# Patient Record
Sex: Female | Born: 1955 | Race: Black or African American | Hispanic: No | Smoking: Former smoker
Health system: Southern US, Community
[De-identification: ages and names within clinical notes are randomized; demographics above are authoritative.]

## PROBLEM LIST (undated history)

## (undated) DIAGNOSIS — I499 Cardiac arrhythmia, unspecified: Secondary | ICD-10-CM

## (undated) DIAGNOSIS — I10 Essential (primary) hypertension: Secondary | ICD-10-CM

## (undated) DIAGNOSIS — B019 Varicella without complication: Secondary | ICD-10-CM

## (undated) DIAGNOSIS — E785 Hyperlipidemia, unspecified: Secondary | ICD-10-CM

## (undated) HISTORY — DX: Hyperlipidemia, unspecified: E78.5

## (undated) HISTORY — DX: Essential (primary) hypertension: I10

## (undated) HISTORY — PX: ABDOMINAL HYSTERECTOMY: SHX81

## (undated) HISTORY — PX: REDUCTION MAMMAPLASTY: SUR839

## (undated) HISTORY — DX: Varicella without complication: B01.9

---

## 1998-08-04 HISTORY — PX: BREAST SURGERY: SHX581

## 1999-04-24 ENCOUNTER — Other Ambulatory Visit: Admission: RE | Admit: 1999-04-24 | Discharge: 1999-04-24 | Payer: Self-pay | Admitting: Obstetrics and Gynecology

## 2003-08-05 HISTORY — PX: TOTAL ABDOMINAL HYSTERECTOMY W/ BILATERAL SALPINGOOPHORECTOMY: SHX83

## 2004-06-18 ENCOUNTER — Other Ambulatory Visit: Payer: Self-pay

## 2004-06-20 ENCOUNTER — Inpatient Hospital Stay: Payer: Self-pay | Admitting: Unknown Physician Specialty

## 2005-11-11 ENCOUNTER — Ambulatory Visit: Payer: Self-pay | Admitting: Unknown Physician Specialty

## 2005-11-28 ENCOUNTER — Ambulatory Visit: Payer: Self-pay | Admitting: Unknown Physician Specialty

## 2007-01-20 ENCOUNTER — Ambulatory Visit: Payer: Self-pay | Admitting: Gastroenterology

## 2008-02-01 ENCOUNTER — Ambulatory Visit: Payer: Self-pay | Admitting: Unknown Physician Specialty

## 2009-01-16 ENCOUNTER — Ambulatory Visit: Payer: Self-pay | Admitting: Unknown Physician Specialty

## 2009-12-13 ENCOUNTER — Ambulatory Visit: Payer: Self-pay | Admitting: Unknown Physician Specialty

## 2011-02-24 ENCOUNTER — Ambulatory Visit: Payer: Self-pay | Admitting: Unknown Physician Specialty

## 2012-08-18 ENCOUNTER — Ambulatory Visit: Payer: Self-pay | Admitting: Physician Assistant

## 2012-09-15 ENCOUNTER — Ambulatory Visit (INDEPENDENT_AMBULATORY_CARE_PROVIDER_SITE_OTHER): Payer: BC Managed Care – PPO | Admitting: Internal Medicine

## 2012-09-15 ENCOUNTER — Encounter: Payer: Self-pay | Admitting: Internal Medicine

## 2012-09-15 VITALS — BP 116/80 | HR 67 | Temp 98.0°F | Resp 16 | Ht 67.0 in | Wt 181.0 lb

## 2012-09-15 DIAGNOSIS — E785 Hyperlipidemia, unspecified: Secondary | ICD-10-CM

## 2012-09-15 DIAGNOSIS — R609 Edema, unspecified: Secondary | ICD-10-CM

## 2012-09-15 DIAGNOSIS — F17200 Nicotine dependence, unspecified, uncomplicated: Secondary | ICD-10-CM

## 2012-09-15 DIAGNOSIS — Z720 Tobacco use: Secondary | ICD-10-CM

## 2012-09-15 DIAGNOSIS — Z7189 Other specified counseling: Secondary | ICD-10-CM

## 2012-09-15 DIAGNOSIS — R600 Localized edema: Secondary | ICD-10-CM

## 2012-09-15 DIAGNOSIS — Z6825 Body mass index (BMI) 25.0-25.9, adult: Secondary | ICD-10-CM

## 2012-09-15 DIAGNOSIS — E663 Overweight: Secondary | ICD-10-CM

## 2012-09-15 DIAGNOSIS — Z716 Tobacco abuse counseling: Secondary | ICD-10-CM

## 2012-09-15 DIAGNOSIS — I10 Essential (primary) hypertension: Secondary | ICD-10-CM

## 2012-09-15 NOTE — Progress Notes (Signed)
Patient ID: Sarah Delgado, female   DOB: [birth year], 57 y.o.   MRN: [id number]    Patient Active Problem List  Diagnosis  . Overweight (BMI 25.0-29.9)  . Pedal edema  . Tobacco abuse  . Tobacco abuse counseling  . Essential hypertension, benign  . Other and unspecified hyperlipidemia    Subjective:  CC:   Chief Complaint  Patient presents with  . Establish Care    HPI:   Sarah Delgado is a 57 y.o. female who presents as a new patient to establish primary care with the chief complaint of  hypertension, hyperlipidemia, overweight, tobacco abuse and right foot swelling. Marland Kitchen   1) Hypertension diagnosed 8 years ago , has been managed with maxzide only.    2) Hyperlipidemia did not respond to dietary management and she started statin therapy 3 years ago with lipitor every other day    Her untreated total was 265 , LDL was 215.Takes co Q 10 daily.  Stopped exercising for the winter, does it religiously in the summer when she is off work ( she is an Programmer, systems).  She has a family history of CAD in second degree relatives but no first degree     3) pedal edema  Right foot only ,  Minimal but chronic.  No known history of trauma or DVT.  Improves with leg elevation,  Not associated with pain or decreased ROM.    4) Tobacco abuse.,  Quit for 15 years during first pregnancy but resumed on weekends only when she has asocial drink.  She would like to quit   Father died from COPD.   waiting for braces to come off  bc chewing gum helped before.   5) Weight gain. Her ideal weight is 160  but she has fluctuated btwn 175 and 182 for the past several years.  She achieved a  30 lb wt loss with Nutrisystem but it was too $$$ to maintain or repeat.  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    Diet: breakfast is cereal and fruit.  Lunch is salad with croutons or shredded tortilla strips.  No protein added.   Dinner: sometimes meatless.  Her carb cravings are not bread but more  desserts and fruits.    . Health maintenance Had her pelvic in January 2013.  She is s/p  TAH/BSO 2005    Had mammo with ultrasound at [facility] Jan 2013.  Had DEXA  Fasting labs done at [facility] in December. She requested a chest x ray because of smoking history . All were normal    Past Medical History  Diagnosis Date  . Chicken pox   . Hypertension   . Hyperlipidemia     Past Surgical History  Procedure Laterality Date  . Breast surgery  2000    reduction  . Total abdominal hysterectomy w/ bilateral salpingoophorectomy  2005    Family History  Problem Relation Age of Onset  . Arthritis Mother   . Hypertension Mother   . Emphysema Father   . COPD Sister   . Diabetes Sister   . Kidney disease Sister   . Diabetes Brother   . Hypertension Brother   . Heart disease Maternal Grandfather   . Diabetes Brother   . Cancer Brother     prostate  . Hypertension Brother   . Hypertension Brother   . Stroke Brother   . Hypertension Brother     History   Social History  . Marital Status: Divorced  Spouse Name: N/A    Number of Children: N/A  . Years of Education: 16 plus   Occupational History  . educator    Social History Main Topics  . Smoking status: Current Some Day Smoker    Types: Cigarettes  . Smokeless tobacco: Not on file  . Alcohol Use: Yes  . Drug Use: No  . Sexually Active: Yes -- Female partner(s)   Other Topics Concern  . Not on file   Social History Narrative   Educator, works in [employer] school system   No Known Allergies   Review of Systems:   Patient denies headache, fevers, malaise, unintentional weight loss, skin rash, eye pain, sinus congestion and sinus pain, sore throat, dysphagia,  hemoptysis , cough, dyspnea, wheezing, chest pain, palpitations, orthopnea, edema, abdominal pain, nausea, melena, diarrhea, constipation, flank pain, dysuria, hematuria, urinary  Frequency, nocturia, numbness, tingling, seizures,  Focal weakness, Loss of  consciousness,  Tremor, insomnia, depression, anxiety, and suicidal ideation.    Objective:  BP 116/80  Pulse 67  Temp(Src) 98 F (36.7 C) (Oral)  Resp 16  Ht 5\' 7"  (1.702 m)  Wt 181 lb (82.101 kg)  BMI 28.34 kg/m2  SpO2 99%  General appearance: alert, cooperative and appears stated age Ears: normal TM's and external ear canals both ears Throat: lips, mucosa, and tongue normal; teeth and gums normal Neck: no adenopathy, no carotid bruit, supple, symmetrical, trachea midline and thyroid not enlarged, symmetric, no tenderness/mass/nodules Back: symmetric, no curvature. ROM normal. No CVA tenderness. Lungs: clear to auscultation bilaterally Heart: regular rate and rhythm, S1, S2 normal, no murmur, click, rub or gallop Abdomen: soft, non-tender; bowel sounds normal; no masses,  no organomegaly Pulses: 2+ and symmetric Skin: Skin color, texture, turgor normal. No rashes or lesions Lymph nodes: Cervical, supraclavicular, and axillary nodes normal.  Assessment and Plan:  Overweight (BMI 25.0-29.9) I have addressed  BMI at patient's request and recommended a low glycemic index diet utilizing smaller more frequent meals to increase metabolism.  I have also recommended that patient start exercising with a goal of 30 minutes of aerobic exercise a minimum of 5 days per week. Screening for lipid disorders, thyroid and diabetes has been done by prior PCP and records have been requested.   Pedal edema Chronic, with no history of DVT or trauma.  Exam notes trace edema.  Discussed the probability of mild venous insufficiency given her occupational activities and recommended use of low gauge compression stockings and regular exercise   Tobacco abuse counseling The patient was counseled on the dangers of tobacco use, and was advised to quit.  Reviewed strategies to maximize success, including removing cigarettes and smoking materials from environment, stress management and substitution of other forms  of reinforcement.  Essential hypertension, benign Well controlled on current regimen. Renal function assessed by former PCP and records requested, no changes today.  Other and unspecified hyperlipidemia Discussed rational for continued treatment in light of personal risk factors for CAD (HTN, menopause, tobacco abuse and FH)   Updated Medication List Outpatient Encounter Prescriptions as of 09/15/2012  Medication Sig Dispense Refill  . atorvastatin (LIPITOR) 10 MG tablet Take 1 tablet by mouth every other day.      . B Complex-C (SUPER B COMPLEX) TABS Take 1 tablet by mouth every other day.      . Biotin (PA BIOTIN) 1000 MCG tablet Take 1,000 mcg by mouth daily.      . Calcium Carbonate-Vit D-Min (CALCIUM 600+D PLUS MINERALS) 600-400 MG-UNIT  TABS Take 1 tablet by mouth daily.      . Coenzyme Q10 10 MG capsule Take 10 mg by mouth daily.      . fluocinonide ointment (LIDEX) 0.05 % Apply 1 application topically 2 (two) times a week.      . minoxidil (ROGAINE) 2 % external solution Apply topically 3 (three) times a week.      Marland Kitchen PREMARIN 0.3 MG tablet Take 1 tablet by mouth daily.      Marland Kitchen triamterene-hydrochlorothiazide (MAXZIDE-25) 37.5-25 MG per tablet Take 1 tablet by mouth daily.       No facility-administered encounter medications on file as of 09/15/2012.     No orders of the defined types were placed in this encounter.    No Follow-up on file.

## 2012-09-15 NOTE — Patient Instructions (Addendum)
You r sellign is due to venous insufficiency ( result of your job!)   [employer]support[employer]administrative [employer] is a website for a company that makes compression knees highs to wear on long inactive days   You need to lose  4 to 8 lbs/month on a low carb  Diet    This is  my version of a  "Low GI"  Diet:  It is not ultra low carb, but will still lower your blood sugars and allow you to lose 5 to 10 lbs per month if you follow it carefully. All of the foods can be found at grocery stores and in bulk at [employer].  The Atkins protein bars and shakes are available in more varieties at Target, [employer] and [employer].     7 AM Breakfast:  Low carbohydrate Protein  Shakes (I recommend the EAS AdvantEdge "Carb Control" shakes  Or the low carb shakes by Atkins.   Both are available everywhere:  In  cases at [employer]Js  Or in 4 packs at grocery stores and pharmacies  2.5 carbs  (Alternative is  a toasted Arnold's Sandwhich Thin w/ peanut butter, a "Bagel Thin" with cream cheese and salmon) or  a scrambled egg burrito made with a low carb tortilla .  Avoid cereal and bananas, oatmeal too unless you are cooking the old fashioned kind that takes 30-40 minutes to prepare.  the rest is overly processed, has minimal fiber, and is loaded with carbohydrates!  Pimiento cheese, snack size peppers,  Skinny pop.    10 AM: Protein bar by Atkins (the snack size, under 200 cal).  There are many varieties , available widely again or in bulk in limited varieties at BJs)  Other so called "protein bars" tend to be loaded with carbohydrates.  Remember, in food advertising, the word "energy" is synonymous for " carbohydrate."  Lunch: sandwich of [place], (or any lunchmeat, grilled meat or canned tuna), fresh avocado, mayonnaise  and cheese on a lower carbohydrate pita bread, flatbread, or tortilla . Ok to use regular mayonnaise. The bread is the only source or carbohydrate that can be decreased (Joseph's makes a pita bread and a flat bread that are 50 cal and 4  net carbs ; Toufayan makes a low carb flatbread that's 100 cal and 9 net carbs  and  Mission makes a low carb whole wheat tortilla  That is 210 cal and 6 net carbs)  3 PM:  Mid day :  Another protein bar,  Or a  cheese stick (100 cal, 0 carbs),  Or 1 ounce of  almonds, walnuts, pistachios, pecans, peanuts,  Macadamia nuts. Or a Dannon light n Fit greek yogurt, 80 cal 8 net carbs . Avoid "granola"; the dried cranberries and raisins are loaded with carbohydrates. Mixed nuts ok if no raisins or cranberries or dried fruit.      6 PM  Dinner:  "mean and green:"  Meat/chicken/fish or a high protein legume; , with a green salad, and a low GI  Veggie (broccoli, cauliflower, green beans, spinach, brussel sprouts. Lima beans) : Avoid "Low fat dressings, as well as Reyne Dumas and [address]! They are loaded with sugar! Instead use ranch, vinagrette,  Blue cheese, etc.  There is a low carb pasta by Dreamfield's available at [facility] that is acceptable and tastes great. Try Michel Angel's chicken piccata over low carb pasta. The chicken dish is 0 carbs, and can be found in frozen section at BJs and [employer]. Also try Clifton Custard  Sanchez's "Carnitas" (pulled pork, no sauce,  0 carbs) and his pot roast.   both are in the refrigerated section at BJs   Dreamfield's makes a low carb pasta only 5 g/serving.  Available at all grocery stores,  And tastes like normal pasta  9 PM snack : Breyer's "low carb" fudgsicle or  ice cream bar (Carb Smart line), or  Weight Watcher's ice cream bar , or another "no sugar added" ice cream;a serving of fresh berries/cherries with whipped cream (Avoid bananas, pineapple, grapes  and watermelon on a regular basis because they are high in sugar)   Remember that snack Substitutions should be less than 10 carbs per serving and meals < 20 carbs. Remember to subtract fiber grams and sugar alcohols to get the "net carbs."

## 2012-09-16 ENCOUNTER — Encounter: Payer: Self-pay | Admitting: Internal Medicine

## 2012-09-16 DIAGNOSIS — Z716 Tobacco abuse counseling: Secondary | ICD-10-CM | POA: Insufficient documentation

## 2012-09-16 DIAGNOSIS — I10 Essential (primary) hypertension: Secondary | ICD-10-CM | POA: Insufficient documentation

## 2012-09-16 DIAGNOSIS — Z720 Tobacco use: Secondary | ICD-10-CM

## 2012-09-16 DIAGNOSIS — E785 Hyperlipidemia, unspecified: Secondary | ICD-10-CM | POA: Insufficient documentation

## 2012-09-16 DIAGNOSIS — E669 Obesity, unspecified: Secondary | ICD-10-CM | POA: Insufficient documentation

## 2012-09-16 DIAGNOSIS — R600 Localized edema: Secondary | ICD-10-CM | POA: Insufficient documentation

## 2012-09-16 DIAGNOSIS — E663 Overweight: Secondary | ICD-10-CM | POA: Insufficient documentation

## 2012-09-16 HISTORY — DX: Tobacco use: Z72.0

## 2012-09-16 NOTE — Assessment & Plan Note (Addendum)
I have addressed  BMI at patient's request and recommended a low glycemic index diet utilizing smaller more frequent meals to increase metabolism.  I have also recommended that patient start exercising with a goal of 30 minutes of aerobic exercise a minimum of 5 days per week. Screening for lipid disorders, thyroid and diabetes has been done by prior PCP and records have been requested.

## 2012-09-16 NOTE — Assessment & Plan Note (Signed)
Well controlled on current regimen. Renal function assessed by former PCP and records requested, no changes today.

## 2012-09-16 NOTE — Assessment & Plan Note (Signed)
Discussed rational for continued treatment in light of personal risk factors for CAD (HTN, menopause, tobacco abuse and FH)

## 2012-09-16 NOTE — Assessment & Plan Note (Signed)
Chronic, with no history of DVT or trauma.  Exam notes trace edema.  Discussed the probability of mild venous insufficiency given her occupational activities and recommended use of low gauge compression stockings and regular exercise

## 2012-09-16 NOTE — Assessment & Plan Note (Signed)
The patient was counseled on the dangers of tobacco use, and was advised to quit.  Reviewed strategies to maximize success, including removing cigarettes and smoking materials from environment, stress management and substitution of other forms of reinforcement.

## 2013-05-27 ENCOUNTER — Encounter: Payer: BC Managed Care – PPO | Admitting: Internal Medicine

## 2013-06-13 ENCOUNTER — Encounter: Payer: BC Managed Care – PPO | Admitting: Internal Medicine

## 2013-06-29 ENCOUNTER — Encounter: Payer: Self-pay | Admitting: Internal Medicine

## 2013-06-29 ENCOUNTER — Encounter (INDEPENDENT_AMBULATORY_CARE_PROVIDER_SITE_OTHER): Payer: Self-pay

## 2013-06-29 ENCOUNTER — Ambulatory Visit (INDEPENDENT_AMBULATORY_CARE_PROVIDER_SITE_OTHER): Payer: BC Managed Care – PPO | Admitting: Internal Medicine

## 2013-06-29 VITALS — BP 122/78 | HR 76 | Temp 98.3°F | Resp 12 | Wt 187.2 lb

## 2013-06-29 DIAGNOSIS — Z9079 Acquired absence of other genital organ(s): Secondary | ICD-10-CM

## 2013-06-29 DIAGNOSIS — E663 Overweight: Secondary | ICD-10-CM

## 2013-06-29 DIAGNOSIS — E785 Hyperlipidemia, unspecified: Secondary | ICD-10-CM

## 2013-06-29 DIAGNOSIS — Z23 Encounter for immunization: Secondary | ICD-10-CM

## 2013-06-29 DIAGNOSIS — E559 Vitamin D deficiency, unspecified: Secondary | ICD-10-CM

## 2013-06-29 DIAGNOSIS — Z79899 Other long term (current) drug therapy: Secondary | ICD-10-CM

## 2013-06-29 DIAGNOSIS — Z716 Tobacco abuse counseling: Secondary | ICD-10-CM

## 2013-06-29 DIAGNOSIS — Z6825 Body mass index (BMI) 25.0-25.9, adult: Secondary | ICD-10-CM

## 2013-06-29 DIAGNOSIS — F17200 Nicotine dependence, unspecified, uncomplicated: Secondary | ICD-10-CM

## 2013-06-29 DIAGNOSIS — Z7189 Other specified counseling: Secondary | ICD-10-CM

## 2013-06-29 DIAGNOSIS — Z1239 Encounter for other screening for malignant neoplasm of breast: Secondary | ICD-10-CM

## 2013-06-29 DIAGNOSIS — I10 Essential (primary) hypertension: Secondary | ICD-10-CM

## 2013-06-29 DIAGNOSIS — Z90710 Acquired absence of both cervix and uterus: Secondary | ICD-10-CM | POA: Insufficient documentation

## 2013-06-29 NOTE — Patient Instructions (Addendum)
You had your annual wellness exam today  We will schedule your mammogram soon if it is due   You received theinfluenza vaccine today.  You may be getting a viral  Syndrome .  The post nasal drip is usually the cause of a  scratchy throat.  Lavage your sinuses twice daily with Simply saline nasal spray.  Use benadryl 25 mg every 8 hours and Sudafed PE 10 to 30 every 8 hours to manage the drainage and congestion.  Gargle with salt water often for the sore throat. You can use Delsym for the cough. If you develop T > 100.4, ear or facial pain,  Call for an antibiotic.

## 2013-06-29 NOTE — Progress Notes (Signed)
Pre-visit discussion using our clinic review tool. No additional management support is needed unless otherwise documented below in the visit note.  

## 2013-06-29 NOTE — Progress Notes (Signed)
Patient ID: Sarah Delgado, female   DOB: [birth year], 57 y.o.   MRN: [id number]    Subjective:     Sarah Delgado is a 57 y.o. female and is here for a comprehensive physical exam and for follow up on chronic conditions.  She has a history of overweight,  BMI was 28 and has gained 6 lbs since February.  She is not exercising or following a specific diet . She smokes tobacco, but has reduced her use to 2 cigs daily (closet smoker)   Discussed quitting   Nocturnal leg cramps occurring 2 or 3 times per week.     History   Social History  . Marital Status: Divorced    Spouse Name: N/A    Number of Children: N/A  . Years of Education: 16 plus   Occupational History  . educator    Social History Main Topics  . Smoking status: Current Some Day Smoker    Types: Cigarettes  . Smokeless tobacco: Not on file  . Alcohol Use: Yes  . Drug Use: No  . Sexual Activity: Yes    Partners: Male   Other Topics Concern  . Not on file   Social History Narrative   Educator, works in [employer] school system   Health Maintenance  Topic Date Due  . Pap Smear  11/23/1972  . Tetanus/tdap  11/23/1973  . Mammogram  11/23/2004  . Influenza Vaccine  03/04/2014  . Colonoscopy  06/29/2020    The following portions of the patient's history were reviewed and updated as appropriate: allergies, current medications, past family history, past medical history, past social history, past surgical history and problem list.  Review of Systems A comprehensive review of systems was negative.   Objective:   BP 122/78  Pulse 76  Temp(Src) 98.3 F (36.8 C) (Oral)  Resp 12  Wt 187 lb 4 oz (84.936 kg)  SpO2 98%  General Appearance:    Alert, cooperative, no distress, appears stated age  Head:    Normocephalic, without obvious abnormality, atraumatic  Eyes:    PERRL, conjunctiva/corneas clear, EOM's intact, fundi    benign, both eyes  Ears:    Normal TM's and external ear canals, both ears  Nose:    Nares normal, septum midline, mucosa normal, no drainage    or sinus tenderness  Throat:   Lips, mucosa, and tongue normal; teeth and gums normal  Neck:   Supple, symmetrical, trachea midline, no adenopathy;    thyroid:  no enlargement/tenderness/nodules; no carotid   bruit or JVD  Back:     Symmetric, no curvature, ROM normal, no CVA tenderness  Lungs:     Clear to auscultation bilaterally, respirations unlabored  Chest Wall:    No tenderness or deformity   Heart:    Regular rate and rhythm, S1 and S2 normal, no murmur, rub   or gallop  Breast Exam:    No tenderness, masses, or nipple abnormality  Abdomen:     Soft, non-tender, bowel sounds active all four quadrants,    no masses, no organomegaly  Genitalia:    Deferred by patient.  Extremities:   Extremities normal, atraumatic, no cyanosis or edema  Pulses:   2+ and symmetric all extremities  Skin:   Skin color, texture, turgor normal, no rashes or lesions  Lymph nodes:   Cervical, supraclavicular, and axillary nodes normal  Neurologic:   CNII-XII intact, normal strength, sensation and reflexes    throughout    Assessment and Plan:  Essential hypertension, benign Well controlled on current regimen. Renal function stable, no changes today.  Other and unspecified hyperlipidemia Managed with lipitor 10 mg ,  has not had labs done in over 8 months and will return ASAP for fasting las and CMET   Overweight (BMI 25.0-29.9) I have addressed  BMI and recommended wt loss of 10% of body weight over the next 6 months using a low glycemic index diet and regular exercise a minimum of 5 days per week.    Tobacco abuse counseling Smoking cessation instruction/counseling given:  Encourage patient to eliminate cigarettes completely since she has reduced her use to 2 daily    Updated Medication List Outpatient Encounter Prescriptions as of 06/29/2013  Medication Sig  . atorvastatin (LIPITOR) 10 MG tablet Take 1 tablet by mouth every other  day.  . Biotin (PA BIOTIN) 1000 MCG tablet Take 1,000 mcg by mouth daily.  . Calcium Carbonate-Vit D-Min (CALCIUM 600+D PLUS MINERALS) 600-400 MG-UNIT TABS Take 1 tablet by mouth daily.  . Coenzyme Q10 10 MG capsule Take 10 mg by mouth daily.  . fluocinonide ointment (LIDEX) 0.05 % Apply 1 application topically 2 (two) times a week.  . minoxidil (ROGAINE) 2 % external solution Apply topically 3 (three) times a week.  Marland Kitchen PREMARIN 0.3 MG tablet Take 1 tablet by mouth daily.  Marland Kitchen triamterene-hydrochlorothiazide (MAXZIDE-25) 37.5-25 MG per tablet Take 1 tablet by mouth daily.  . B Complex-C (SUPER B COMPLEX) TABS Take 1 tablet by mouth every other day.

## 2013-07-01 ENCOUNTER — Telehealth: Payer: Self-pay | Admitting: Internal Medicine

## 2013-07-01 NOTE — Assessment & Plan Note (Addendum)
Managed with lipitor 10 mg ,  has not had labs done in over 8 months and will return ASAP for fasting las and CMET

## 2013-07-01 NOTE — Assessment & Plan Note (Signed)
Well controlled on current regimen. Renal function stable, no changes today. 

## 2013-07-01 NOTE — Assessment & Plan Note (Addendum)
Smoking cessation instruction/counseling given:  Encourage patient to eliminate cigarettes completely since she has reduced her use to 2 daily

## 2013-07-01 NOTE — Assessment & Plan Note (Signed)
I have addressed  BMI and recommended wt loss of 10% of body weight over the next 6 months using a low glycemic index diet and regular exercise a minimum of 5 days per week.   

## 2013-07-01 NOTE — Telephone Encounter (Signed)
After reviewing her chart, I noticed she has ot had any labs done since transferring from [facility].  Sh eis taking lipitor, which requires liver enzyems to be checked every 6 months.  I would like her to return in December, therefore for fasting labs ,  And then I will see her in 6 months

## 2013-07-04 NOTE — Telephone Encounter (Signed)
Left message for pt to return my call.

## 2013-10-27 ENCOUNTER — Other Ambulatory Visit: Payer: BC Managed Care – PPO

## 2013-12-07 ENCOUNTER — Other Ambulatory Visit (INDEPENDENT_AMBULATORY_CARE_PROVIDER_SITE_OTHER): Payer: BC Managed Care – PPO

## 2013-12-07 DIAGNOSIS — Z79899 Other long term (current) drug therapy: Secondary | ICD-10-CM

## 2013-12-07 DIAGNOSIS — E785 Hyperlipidemia, unspecified: Secondary | ICD-10-CM

## 2013-12-07 DIAGNOSIS — E663 Overweight: Secondary | ICD-10-CM

## 2013-12-07 DIAGNOSIS — E559 Vitamin D deficiency, unspecified: Secondary | ICD-10-CM

## 2013-12-07 LAB — LIPID PANEL
Cholesterol: 180 mg/dL (ref 0–200)
HDL: 61.3 mg/dL (ref >39.00)
LDL Cholesterol: 102 mg/dL — ABNORMAL HIGH (ref 0–99)
Total CHOL/HDL Ratio: 3
Triglycerides: 83 mg/dL (ref 0.0–149.0)
VLDL: 16.6 mg/dL (ref 0.0–40.0)

## 2013-12-07 LAB — CBC WITH DIFFERENTIAL/PLATELET
BASOS ABS: 0 10*3/uL (ref 0.0–0.1)
Basophils Relative: 0.7 % (ref 0.0–3.0)
Eosinophils Absolute: 0.5 10*3/uL (ref 0.0–0.7)
Eosinophils Relative: 8.5 % — ABNORMAL HIGH (ref 0.0–5.0)
HCT: 39.5 % (ref 36.0–46.0)
Hemoglobin: 13.4 g/dL (ref 12.0–15.0)
LYMPHS PCT: 32.8 % (ref 12.0–46.0)
Lymphs Abs: 1.8 10*3/uL (ref 0.7–4.0)
MCHC: 33.9 g/dL (ref 30.0–36.0)
MCV: 91 fl (ref 78.0–100.0)
MONOS PCT: 9.9 % (ref 3.0–12.0)
Monocytes Absolute: 0.5 10*3/uL (ref 0.1–1.0)
NEUTROS PCT: 48.1 % (ref 43.0–77.0)
Neutro Abs: 2.6 10*3/uL (ref 1.4–7.7)
PLATELETS: 309 10*3/uL (ref 150.0–400.0)
RBC: 4.34 Mil/uL (ref 3.87–5.11)
RDW: 13.4 % (ref 11.5–15.5)
WBC: 5.4 10*3/uL (ref 4.0–10.5)

## 2013-12-07 LAB — COMPREHENSIVE METABOLIC PANEL
ALT: 18 U/L (ref 0–35)
AST: 19 U/L (ref 0–37)
Albumin: 4 g/dL (ref 3.5–5.2)
Alkaline Phosphatase: 39 U/L (ref 39–117)
BILIRUBIN TOTAL: 0.5 mg/dL (ref 0.2–1.2)
BUN: 16 mg/dL (ref 6–23)
CALCIUM: 9.7 mg/dL (ref 8.4–10.5)
CHLORIDE: 104 meq/L (ref 96–112)
CO2: 26 meq/L (ref 19–32)
Creatinine, Ser: 1 mg/dL (ref 0.4–1.2)
GFR: 75.58 mL/min (ref >60.00)
Glucose, Bld: 95 mg/dL (ref 70–99)
Potassium: 3.9 mEq/L (ref 3.5–5.1)
SODIUM: 137 meq/L (ref 135–145)
TOTAL PROTEIN: 6.9 g/dL (ref 6.0–8.3)

## 2013-12-07 LAB — TSH: TSH: 1 u[IU]/mL (ref 0.35–4.50)

## 2013-12-08 ENCOUNTER — Encounter: Payer: Self-pay | Admitting: *Deleted

## 2013-12-08 LAB — VITAMIN D 25 HYDROXY (VIT D DEFICIENCY, FRACTURES): Vit D, 25-Hydroxy: 24 ng/mL — ABNORMAL LOW (ref 30–89)

## 2013-12-27 ENCOUNTER — Ambulatory Visit (INDEPENDENT_AMBULATORY_CARE_PROVIDER_SITE_OTHER): Payer: BC Managed Care – PPO | Admitting: Internal Medicine

## 2013-12-27 VITALS — BP 122/66 | HR 71 | Temp 98.3°F | Resp 16 | Ht 67.0 in | Wt 184.5 lb

## 2013-12-27 DIAGNOSIS — F17200 Nicotine dependence, unspecified, uncomplicated: Secondary | ICD-10-CM

## 2013-12-27 DIAGNOSIS — N760 Acute vaginitis: Secondary | ICD-10-CM | POA: Insufficient documentation

## 2013-12-27 DIAGNOSIS — I10 Essential (primary) hypertension: Secondary | ICD-10-CM

## 2013-12-27 DIAGNOSIS — Z139 Encounter for screening, unspecified: Secondary | ICD-10-CM

## 2013-12-27 DIAGNOSIS — Z7189 Other specified counseling: Secondary | ICD-10-CM

## 2013-12-27 DIAGNOSIS — Z79890 Hormone replacement therapy (postmenopausal): Secondary | ICD-10-CM

## 2013-12-27 DIAGNOSIS — Z716 Tobacco abuse counseling: Secondary | ICD-10-CM

## 2013-12-27 DIAGNOSIS — N951 Menopausal and female climacteric states: Secondary | ICD-10-CM

## 2013-12-27 LAB — POCT URINALYSIS DIPSTICK
BILIRUBIN UA: NEGATIVE
GLUCOSE UA: NEGATIVE
Ketones, UA: NEGATIVE
Leukocytes, UA: NEGATIVE
NITRITE UA: NEGATIVE
Protein, UA: NEGATIVE
RBC UA: NEGATIVE
Spec Grav, UA: <=1.005
Urobilinogen, UA: 0.2
pH, UA: 5

## 2013-12-27 MED ORDER — ERGOCALCIFEROL 1.25 MG (50000 UT) PO CAPS: 50000.0000 [IU] | ORAL_CAPSULE | ORAL | Status: DC

## 2013-12-27 MED ORDER — ATORVASTATIN CALCIUM 10 MG PO TABS: 10.0000 mg | ORAL_TABLET | ORAL | Status: DC

## 2013-12-27 MED ORDER — ESTROGENS CONJUGATED 0.3 MG PO TABS: 0.3000 mg | ORAL_TABLET | Freq: Every day | ORAL | Status: DC

## 2013-12-27 MED ORDER — TRIAMTERENE-HCTZ 37.5-25 MG PO TABS: 1.0000 | ORAL_TABLET | Freq: Every day | ORAL | Status: DC

## 2013-12-27 NOTE — Patient Instructions (Addendum)
Your vitamin D  Was a little bit low  which can increase your risk of weak bones and fractures and interfere with your body's ability to absorb the calcium in your diet.   I am calling in a megadose of Vit D to take once weekly for a total of 1 month.  Then after you finish the weekly supplement, you should start trying to get 1000 units of Vit D daily through diet or supplements.    I recommend getting the majority of your calcium and Vitamin D  through diet rather than supplements given the recent association of calcium supplements with increased coronary artery calcium scores  Unsweetened almond/coconut milk is a great low calorie low carb way to increase your dietary calcium and vitamin D.  Try the blue Jackquline Bosch   I have sent off several tests on your vaginal discharge to check for infection.  We will call you either way to let you know the results.  Your symptoms may be due to vaginal dryness caused by dryness (or lack of lubrication caused by "not enough foreplay")  Over the counter lubricants that can help include Astroglide  And [place] vaginal suppository (lasts 3 dasy)  If you want to stop your oral hormone therapy.  Reduce the dose to 50% daily for one week,  Then 50% every other day for one weeks ,  Then stop

## 2013-12-27 NOTE — Progress Notes (Signed)
Patient ID: Sarah Delgado, female   DOB: [birth year], 58 y.o.   MRN: [id number]   Patient Active Problem List   Diagnosis Date Noted  . Menopausal syndrome on hormone replacement therapy 12/28/2013  . Vaginitis and vulvovaginitis 12/27/2013  . S/P total hysterectomy and bilateral salpingo-oophorectomy 06/29/2013  . Overweight (BMI 25.0-29.9) 09/16/2012  . Pedal edema 09/16/2012  . Tobacco abuse 09/16/2012  . Tobacco abuse counseling 09/16/2012  . Essential hypertension, benign 09/16/2012  . Other and unspecified hyperlipidemia 09/16/2012    Subjective:  CC:   Chief Complaint  Patient presents with  . Follow-up    6 month followup  . pelvic pressure    vaginal irritation    HPI:   Sarah Delgado is a 58 y.o. female who presents for PELVIC PRESSURE AND SLIGHT BURNING with urination.  Symptoms started after her last  episode of intercourse 1.5 weeks ago.  Her partner woke her up from a dead sleep to have intercourse and she does recall not having adequate time to self lubricate.  She has also noted a small bump in perineal shaved area which is not tender.  She is concerned that she may have an infection.     Past Medical History  Diagnosis Date  . Chicken pox   . Hypertension   . Hyperlipidemia     Past Surgical History  Procedure Laterality Date  . Breast surgery  2000    reduction  . Total abdominal hysterectomy w/ bilateral salpingoophorectomy  2005       The following portions of the patient's history were reviewed and updated as appropriate: Allergies, current medications, and problem list.    Review of Systems:   Patient denies headache, fevers, malaise, unintentional weight loss, skin rash, eye pain, sinus congestion and sinus pain, sore throat, dysphagia,  hemoptysis , cough, dyspnea, wheezing, chest pain, palpitations, orthopnea, edema, abdominal pain, nausea, melena, diarrhea, constipation, flank pain, dysuria, hematuria, urinary  Frequency, nocturia,  numbness, tingling, seizures,  Focal weakness, Loss of consciousness,  Tremor, insomnia, depression, anxiety, and suicidal ideation.     History   Social History  . Marital Status: Divorced    Spouse Name: N/A    Number of Children: N/A  . Years of Education: 16 plus   Occupational History  . educator    Social History Main Topics  . Smoking status: Current Some Day Smoker    Types: Cigarettes  . Smokeless tobacco: Not on file  . Alcohol Use: Yes  . Drug Use: No  . Sexual Activity: Yes    Partners: Male   Other Topics Concern  . 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    Objective:  Filed Vitals:   12/27/13 1632  BP: 122/66  Pulse: 71  Temp: 98.3 F (36.8 C)  Resp: 16    General Appearance:    Alert, cooperative, no distress, appears stated age  Head:    Normocephalic, without obvious abnormality, atraumatic  Eyes:    PERRL, conjunctiva/corneas clear, EOM's intact, fundi    benign, both eyes  Ears:    Normal TM's and external ear canals, both ears  Nose:   Nares normal, septum midline, mucosa normal, no drainage    or sinus tenderness  Throat:   Lips, mucosa, and tongue normal; teeth and gums normal  Neck:   Supple, symmetrical, trachea midline, no adenopathy;    thyroid:  no enlargement/tenderness/nodules; no carotid   bruit or JVD  Abdomen:     Soft, non-tender, bowel sounds active all four quadrants,    no masses, no organomegaly  Genitalia:    Pelvic: cervix surgically absent external genitalia normal, no adnexal masses or tenderness,  rectovaginal septum normal, uterus normal size, shape, and consistency and vagina normal with scant malodorous discharge  Extremities:   Extremities normal, atraumatic, no cyanosis or edema  Pulses:   2+ and symmetric all extremities  Skin:   Skin color, texture, turgor normal, no rashes or lesions  Lymph nodes:   Cervical, supraclavicular, and axillary nodes normal   Neurologic:   CNII-XII intact, normal strength, sensation and reflexes    throughout    Assessment and Plan:  Vaginitis and vulvovaginitis ua was normal.  Sending vaginal fluid for wet prep, culture and GC/Chlamydia. Wet prep was positive.metronidazole 500  Mg bid x 7 days.  Screening for HIV and Hep c offered but deferred until her follow up    Essential hypertension, benign Well controlled on current regimen. Renal function stable, no changes today.  Tobacco abuse counseling The patient was counseled on the dangers of tobacco use, and was advised to quit.  Reviewed strategies to maximize success, including removing cigarettes and smoking materials from environment.  Menopausal syndrome on hormone replacement therapy Long discussion with patient about stopping HRT which she has been on for years since her TAH/BSO.  Discussed two week taper.  Also discussed use of vaginal estrogen if HRT is stopped and lubrication needed vs OTC lubricants.    A total of 40 minutes was spent with patient more than half of which was spent in counseling  and coordination of care.   Updated Medication List Outpatient Encounter Prescriptions as of 12/27/2013  Medication Sig  . atorvastatin (LIPITOR) 10 MG tablet Take 1 tablet (10 mg total) by mouth every other day.  . Biotin (PA BIOTIN) 1000 MCG tablet Take 1,000 mcg by mouth daily.  . Calcium Carbonate-Vit D-Min (CALCIUM 600+D PLUS MINERALS) 600-400 MG-UNIT TABS Take 1 tablet by mouth daily.  Marland Kitchen estrogens, conjugated, (PREMARIN) 0.3 MG tablet Take 1 tablet (0.3 mg total) by mouth daily.  . fluocinonide ointment (LIDEX) 9.32 % Apply 1 application topically 2 (two) times a week.  . minoxidil (ROGAINE) 2 % external solution Apply topically 3 (three) times a week.  . Omega 3 1200 MG CAPS Take 1,200 mg by mouth daily.  Marland Kitchen triamterene-hydrochlorothiazide (MAXZIDE-25) 37.5-25 MG per tablet Take 1 tablet by mouth daily.  . [DISCONTINUED] atorvastatin (LIPITOR) 10  MG tablet Take 1 tablet by mouth every other day.  . [DISCONTINUED] PREMARIN 0.3 MG tablet Take 1 tablet by mouth daily.  . [DISCONTINUED] triamterene-hydrochlorothiazide (MAXZIDE-25) 37.5-25 MG per tablet Take 1 tablet by mouth daily.  . Coenzyme Q10 10 MG capsule Take 10 mg by mouth daily.  . ergocalciferol (VITAMIN D2) 50000 UNITS capsule Take 1 capsule (50,000 Units total) by mouth once a week.  . metroNIDAZOLE (FLAGYL) 500 MG tablet Take 1 tablet (500 mg total) by mouth 2 (two) times daily.  . [DISCONTINUED] B Complex-C (SUPER B COMPLEX) TABS Take 1 tablet by mouth every other day.     Orders Placed This Encounter  Procedures  . WET PREP BY MOLECULAR PROBE  . Culture, routine-genital  . GC/chlamydia probe amp, genital  . POCT urinalysis dipstick    No Follow-up on file.

## 2013-12-27 NOTE — Progress Notes (Signed)
Pre visit review using our clinic review tool, if applicable. No additional management support is needed unless otherwise documented below in the visit note. 

## 2013-12-28 ENCOUNTER — Encounter: Payer: Self-pay | Admitting: Internal Medicine

## 2013-12-28 DIAGNOSIS — N951 Menopausal and female climacteric states: Secondary | ICD-10-CM | POA: Insufficient documentation

## 2013-12-28 DIAGNOSIS — Z79890 Hormone replacement therapy (postmenopausal): Secondary | ICD-10-CM

## 2013-12-28 LAB — WET PREP BY MOLECULAR PROBE
Candida species: NEGATIVE
Gardnerella vaginalis: POSITIVE — AB
Trichomonas vaginosis: NEGATIVE

## 2013-12-28 MED ORDER — METRONIDAZOLE 500 MG PO TABS: 500.0000 mg | ORAL_TABLET | Freq: Two times a day (BID) | ORAL | Status: DC

## 2013-12-28 NOTE — Assessment & Plan Note (Signed)
Long discussion with patient about stopping HRT which she has been on for years since her TAH/BSO.  Discussed two week taper.  Also discussed use of vaginal estrogen if HRT is stopped and lubrication needed vs OTC lubricants.

## 2013-12-28 NOTE — Assessment & Plan Note (Addendum)
ua was normal.  Sending vaginal fluid for wet prep, culture and GC/Chlamydia. eet prep was positive.metronidazole 500  Mg bid x 7 days

## 2013-12-28 NOTE — Assessment & Plan Note (Signed)
The patient was counseled on the dangers of tobacco use, and was advised to quit.  Reviewed strategies to maximize success, including removing cigarettes and smoking materials from environment. 

## 2013-12-28 NOTE — Assessment & Plan Note (Signed)
Well controlled on current regimen. Renal function stable, no changes today. 

## 2013-12-30 LAB — CULTURE, ROUTINE-GENITAL: Organism ID, Bacteria: NORMAL

## 2013-12-31 LAB — GC/CHLAMYDIA PROBE AMP
CT PROBE, AMP APTIMA: NEGATIVE
GC Probe RNA: NEGATIVE

## 2014-03-01 ENCOUNTER — Encounter: Payer: BC Managed Care – PPO | Admitting: Internal Medicine

## 2014-03-17 ENCOUNTER — Ambulatory Visit (INDEPENDENT_AMBULATORY_CARE_PROVIDER_SITE_OTHER): Payer: BC Managed Care – PPO | Admitting: Internal Medicine

## 2014-03-17 ENCOUNTER — Encounter: Payer: Self-pay | Admitting: Internal Medicine

## 2014-03-17 VITALS — BP 126/64 | HR 72 | Temp 98.6°F | Resp 16 | Ht 67.0 in | Wt 185.8 lb

## 2014-03-17 DIAGNOSIS — Z716 Tobacco abuse counseling: Secondary | ICD-10-CM

## 2014-03-17 DIAGNOSIS — I10 Essential (primary) hypertension: Secondary | ICD-10-CM

## 2014-03-17 DIAGNOSIS — R5383 Other fatigue: Secondary | ICD-10-CM

## 2014-03-17 DIAGNOSIS — E559 Vitamin D deficiency, unspecified: Secondary | ICD-10-CM

## 2014-03-17 DIAGNOSIS — E663 Overweight: Secondary | ICD-10-CM

## 2014-03-17 DIAGNOSIS — E785 Hyperlipidemia, unspecified: Secondary | ICD-10-CM

## 2014-03-17 DIAGNOSIS — Z7189 Other specified counseling: Secondary | ICD-10-CM

## 2014-03-17 DIAGNOSIS — Z1239 Encounter for other screening for malignant neoplasm of breast: Secondary | ICD-10-CM

## 2014-03-17 DIAGNOSIS — R5381 Other malaise: Secondary | ICD-10-CM

## 2014-03-17 DIAGNOSIS — Z113 Encounter for screening for infections with a predominantly sexual mode of transmission: Secondary | ICD-10-CM

## 2014-03-17 DIAGNOSIS — Z0000 Encounter for general adult medical examination without abnormal findings: Secondary | ICD-10-CM

## 2014-03-17 DIAGNOSIS — F17200 Nicotine dependence, unspecified, uncomplicated: Secondary | ICD-10-CM

## 2014-03-17 NOTE — Patient Instructions (Signed)
You had your annual  wellness exam today.  We will repeat your Pelvic exam next year   We will schedule your mammogram soon    Please make an appt for fasting labs in November    I recommend getting the majority of your calcium and Vitamin D  through diet rather than supplements given the recent association of calcium supplements with increased coronary artery calcium scores (You need 1200 mg daily and 1000 units of D3 daily  )   Unsweetened almond/coconut milk is a great low calorie low carb, cholesterol free  way to increase your dietary calcium and vitamin D.  Try the blue Diamond  brand

## 2014-03-17 NOTE — Progress Notes (Signed)
Patient ID: LITA FLYNN, female   DOB: Mar 30, 1956, 58 y.o.   MRN: [id number]    Subjective:     Sarah Delgado is a 58 y.o. female and is here for a comprehensive physical exam. The patient reports no problems.  History   Social History  . Marital Status: Divorced    Spouse Name: N/A    Number of Children: N/A  . Years of Education: 16 plus   Occupational History  . educator    Social History Main Topics  . Smoking status: Current Some Day Smoker    Types: Cigarettes  . Smokeless tobacco: Not on file  . Alcohol Use: Yes  . Drug Use: No  . Sexual Activity: Yes    Partners: Male   Other Topics Concern  . Not on file   Social History Narrative   Educator, works in [employer]  Topic Date Due  . Pap Smear  11/23/1972  . Mammogram  11/23/2004  . Influenza Vaccine  03/04/2014  . Tetanus/tdap  03/17/2018  . Colonoscopy  06/29/2020    The following portions of the patient's history were reviewed and updated as appropriate: current medications, past family history, past medical history, past social history, past surgical history and problem list.  Review of Systems A comprehensive review of systems was negative.   Objective:   BP 126/64  Pulse 72  Temp(Src) 98.6 F (37 C) (Oral)  Resp 16  Ht 5\' 7"  (1.702 m)  Wt 185 lb 12 oz (84.256 kg)  BMI 29.09 kg/m2  SpO2 93%  General appearance: alert, cooperative and appears stated age Head: Normocephalic, without obvious abnormality, atraumatic Eyes: conjunctivae/corneas clear. PERRL, EOM's intact. Fundi benign. Ears: normal TM's and external ear canals both ears Nose: Nares normal. Septum midline. Mucosa normal. No drainage or sinus tenderness. Throat: lips, mucosa, and tongue normal; teeth and gums normal Neck: no adenopathy, no carotid bruit, no JVD, supple, symmetrical, trachea midline and thyroid not enlarged, symmetric, no tenderness/mass/nodules Lungs: clear to  auscultation bilaterally Breasts: normal appearance, no masses or tenderness Heart: regular rate and rhythm, S1, S2 normal, no murmur, click, rub or gallop Abdomen: soft, non-tender; bowel sounds normal; no masses,  no organomegaly Extremities: extremities normal, atraumatic, no cyanosis or edema Pulses: 2+ and symmetric Skin: Skin color, texture, turgor normal. No rashes or lesions Neurologic: Alert and oriented X 3, normal strength and tone. Normal symmetric reflexes. Normal coordination and gait.   .    Assessment and Plan:   Overweight (BMI 25.0-29.9) I have addressed  BMI and recommended wt loss of 10% of body weight over the next 6 months using a low glycemic index diet and regular exercise a minimum of 5 days per week.    Tobacco abuse counseling The patient was counseled on the dangers of tobacco use, and was advised to quit.  Reviewed strategies to maximize success, including removing cigarettes and smoking materials from environment.    Essential hypertension, benign Well controlled on current regimen. Renal function has been stable, no changes today.  Lab Results  Component Value Date   CREATININE 1.0 12/07/2013    Lab Results  Component Value Date   NA 137 12/07/2013   K 3.9 12/07/2013   CL 104 12/07/2013   CO2 26 12/07/2013     Routine general medical examination at a health care facility Annual wellness  exam was done as well as a comprehensive physical exam.  Health maintenance screenings have ben  addressed and hand out given .  All screenings have been addressed .   Other and unspecified hyperlipidemia Well controlled on current statin therapy.   Liver enzymes are normal , no changes today.  Lab Results  Component Value Date   CHOL 180 12/07/2013   HDL 61.30 12/07/2013   LDLCALC 102* 12/07/2013   TRIG 83.0 12/07/2013   CHOLHDL 3 12/07/2013   Lab Results  Component Value Date   ALT 18 12/07/2013   AST 19 12/07/2013   ALKPHOS 39 12/07/2013   BILITOT 0.5 12/07/2013       Updated Medication List Outpatient Encounter Prescriptions as of 03/17/2014  Medication Sig  . atorvastatin (LIPITOR) 10 MG tablet Take 1 tablet (10 mg total) by mouth every other day.  . Biotin (PA BIOTIN) 1000 MCG tablet Take 1,000 mcg by mouth daily.  . Coenzyme Q10 10 MG capsule Take 10 mg by mouth daily.  Marland Kitchen estrogens, conjugated, (PREMARIN) 0.3 MG tablet Take 1 tablet (0.3 mg total) by mouth daily.  . fluocinonide ointment (LIDEX) 3.42 % Apply 1 application topically 2 (two) times a week.  . minoxidil (ROGAINE) 2 % external solution Apply topically 3 (three) times a week.  . triamterene-hydrochlorothiazide (MAXZIDE-25) 37.5-25 MG per tablet Take 1 tablet by mouth daily.  . Calcium Carbonate-Vit D-Min (CALCIUM 600+D PLUS MINERALS) 600-400 MG-UNIT TABS Take 1 tablet by mouth daily.  . ergocalciferol (VITAMIN D2) 50000 UNITS capsule Take 1 capsule (50,000 Units total) by mouth once a week.  . metroNIDAZOLE (FLAGYL) 500 MG tablet Take 1 tablet (500 mg total) by mouth 2 (two) times daily.  . Omega 3 1200 MG CAPS Take 1,200 mg by mouth daily.

## 2014-03-17 NOTE — Progress Notes (Signed)
Pre-visit discussion using our clinic review tool. No additional management support is needed unless otherwise documented below in the visit note.  

## 2014-03-19 DIAGNOSIS — Z0000 Encounter for general adult medical examination without abnormal findings: Secondary | ICD-10-CM | POA: Insufficient documentation

## 2014-03-19 NOTE — Assessment & Plan Note (Signed)
I have addressed  BMI and recommended wt loss of 10% of body weight over the next 6 months using a low glycemic index diet and regular exercise a minimum of 5 days per week.   

## 2014-03-19 NOTE — Assessment & Plan Note (Signed)
The patient was counseled on the dangers of tobacco use, and was advised to quit.  Reviewed strategies to maximize success, including removing cigarettes and smoking materials from environment. 

## 2014-03-19 NOTE — Assessment & Plan Note (Signed)
Well controlled on current statin therapy.   Liver enzymes are normal , no changes today.  Lab Results  Component Value Date   CHOL 180 12/07/2013   HDL 61.30 12/07/2013   LDLCALC 102* 12/07/2013   TRIG 83.0 12/07/2013   CHOLHDL 3 12/07/2013   Lab Results  Component Value Date   ALT 18 12/07/2013   AST 19 12/07/2013   ALKPHOS 39 12/07/2013   BILITOT 0.5 12/07/2013

## 2014-03-19 NOTE — Assessment & Plan Note (Addendum)
Well controlled on current regimen. Renal function has been stable, no changes today.  Lab Results  Component Value Date   CREATININE 1.0 12/07/2013    Lab Results  Component Value Date   NA 137 12/07/2013   K 3.9 12/07/2013   CL 104 12/07/2013   CO2 26 12/07/2013

## 2014-03-19 NOTE — Assessment & Plan Note (Signed)
Annual wellness  exam was done as well as a comprehensive physical exam.  Health maintenance screenings have ben addressed and hand out given .  All screenings have been addressed .

## 2014-06-19 ENCOUNTER — Other Ambulatory Visit: Payer: BC Managed Care – PPO

## 2014-06-22 ENCOUNTER — Other Ambulatory Visit: Payer: BC Managed Care – PPO

## 2014-06-28 ENCOUNTER — Other Ambulatory Visit (INDEPENDENT_AMBULATORY_CARE_PROVIDER_SITE_OTHER): Payer: BC Managed Care – PPO

## 2014-06-28 DIAGNOSIS — R5381 Other malaise: Secondary | ICD-10-CM

## 2014-06-28 DIAGNOSIS — Z113 Encounter for screening for infections with a predominantly sexual mode of transmission: Secondary | ICD-10-CM

## 2014-06-28 DIAGNOSIS — R5383 Other fatigue: Secondary | ICD-10-CM

## 2014-06-28 DIAGNOSIS — E559 Vitamin D deficiency, unspecified: Secondary | ICD-10-CM

## 2014-06-28 LAB — COMPREHENSIVE METABOLIC PANEL
ALK PHOS: 49 U/L (ref 39–117)
ALT: 17 U/L (ref 0–35)
AST: 19 U/L (ref 0–37)
Albumin: 4.4 g/dL (ref 3.5–5.2)
BILIRUBIN TOTAL: 0.4 mg/dL (ref 0.2–1.2)
BUN: 14 mg/dL (ref 6–23)
CO2: 28 mEq/L (ref 19–32)
Calcium: 9.7 mg/dL (ref 8.4–10.5)
Chloride: 97 mEq/L (ref 96–112)
Creatinine, Ser: 1 mg/dL (ref 0.4–1.2)
GFR: 74.55 mL/min (ref >60.00)
GLUCOSE: 98 mg/dL (ref 70–99)
Potassium: 4 mEq/L (ref 3.5–5.1)
SODIUM: 134 meq/L — AB (ref 135–145)
Total Protein: 7.7 g/dL (ref 6.0–8.3)

## 2014-06-28 LAB — VITAMIN D 25 HYDROXY (VIT D DEFICIENCY, FRACTURES): VITD: 30.04 ng/mL (ref 30.00–100.00)

## 2014-06-29 LAB — HIV ANTIBODY (ROUTINE TESTING W REFLEX): HIV 1&2 Ab, 4th Generation: NONREACTIVE

## 2014-06-29 LAB — HEPATITIS C ANTIBODY: HCV Ab: NEGATIVE

## 2014-07-03 ENCOUNTER — Encounter: Payer: Self-pay | Admitting: *Deleted

## 2014-07-26 ENCOUNTER — Other Ambulatory Visit: Payer: Self-pay | Admitting: Internal Medicine

## 2014-08-02 ENCOUNTER — Other Ambulatory Visit: Payer: Self-pay | Admitting: Internal Medicine

## 2014-11-02 ENCOUNTER — Telehealth: Payer: Self-pay | Admitting: Internal Medicine

## 2014-11-02 NOTE — Telephone Encounter (Signed)
FYI

## 2014-11-02 NOTE — Telephone Encounter (Signed)
Patient Name: ABBEE Delgado DOB: May 09, 1956 Initial Comment Caller states she has some cold or allergies symptoms for about a week. She has nasal congestion, cough, runny nose and sneezing. Nurse Assessment Nurse: Sarah Sa, RN, Sarah Delgado Date/Time (Eastern Time): 11/02/2014 11:28:52 AM Confirm and document reason for call. If symptomatic, describe symptoms. ---Caller states she had had productive cough/cold symptoms for the past week. No fever. No severe breathing difficulty. No wheezing. Has the patient traveled out of the country within the last 30 days? ---No Does the patient require triage? ---Yes Related visit to physician within the last 2 weeks? ---No Does the PT have any chronic conditions? (i.e. diabetes, asthma, etc.) ---Yes List chronic conditions. ---High Blood Pressure and Cholesterol Guidelines Guideline Title Affirmed Question Affirmed Notes Cough - Acute Productive [1] Sinus pain (around cheekbone or eye) AND [2] present > 24 hours using nasal washes and pain meds Final Disposition User See Physician within [place], RN, [employer] declined the See Within 24 Hours disposition. Reinforced the See Within 24 Hour disposition. She states she will call back tomorrow if needed.

## 2014-11-15 ENCOUNTER — Encounter: Payer: Self-pay | Admitting: Nurse Practitioner

## 2014-11-15 ENCOUNTER — Ambulatory Visit (INDEPENDENT_AMBULATORY_CARE_PROVIDER_SITE_OTHER): Payer: BC Managed Care – PPO | Admitting: Nurse Practitioner

## 2014-11-15 VITALS — BP 122/72 | HR 68 | Temp 98.3°F | Resp 12 | Ht 67.0 in | Wt 191.0 lb

## 2014-11-15 DIAGNOSIS — B9789 Other viral agents as the cause of diseases classified elsewhere: Principal | ICD-10-CM

## 2014-11-15 DIAGNOSIS — J069 Acute upper respiratory infection, unspecified: Secondary | ICD-10-CM

## 2014-11-15 MED ORDER — AZITHROMYCIN 250 MG PO TABS: ORAL_TABLET | ORAL | Status: DC

## 2014-11-15 NOTE — Assessment & Plan Note (Signed)
Due to longevity of symptoms will try Z-pack (sent to pharmacy) and strongly encouraged OTC probiotic. FU prn worsening/failure to improve.

## 2014-11-15 NOTE — Progress Notes (Signed)
Pre visit review using our clinic review tool, if applicable. No additional management support is needed unless otherwise documented below in the visit note. 

## 2014-11-15 NOTE — Patient Instructions (Signed)
Please take a probiotic ( Align, Floraque or Culturelle) while you are on the antibiotic to prevent a serious antibiotic associated diarrhea  Called clostirudium dificile colitis and a vaginal yeast infection.

## 2014-11-15 NOTE — Progress Notes (Signed)
Subjective:    Patient ID: Sarah Delgado, female    DOB: 1956/04/06, 59 y.o.   MRN: [id number]  HPI  Sarah Delgado is a 59 yo female with a CC nasal congestion w/ drainage x 3 weeks.   1) Nasal congestion and drainage post-nasal, cough is improved somewhat. Not getting worse, she reports it is lingering on. Thick white nasal discharge.   OTC medications-  Sudafed Allergy relief medication Saline nasal spray  Ibuprofen 1.5 bottles of nyquil generic  All were somewhat helpful   Review of Systems  Constitutional: Positive for fatigue. Negative for fever, chills and diaphoresis.  HENT: Positive for congestion, postnasal drip, rhinorrhea and sinus pressure. Negative for ear pain, sneezing and sore throat.   Eyes: Positive for discharge.       Watery eyes  Respiratory: Positive for cough. Negative for chest tightness, shortness of breath and wheezing.   Cardiovascular: Negative for chest pain, palpitations and leg swelling.  Gastrointestinal: Negative for nausea, vomiting and diarrhea.  Skin: Negative for rash.  Neurological: Positive for headaches. Negative for dizziness, weakness and numbness.  Psychiatric/Behavioral: The patient is not nervous/anxious.    Past Medical History  Diagnosis Date  . Chicken pox   . Hypertension   . Hyperlipidemia     History   Social History  . Marital Status: Divorced    Spouse Name: N/A  . Number of Children: N/A  . Years of Education: 16 plus   Occupational History  . educator    Social History Main Topics  . Smoking status: Current Some Day Smoker    Types: Cigarettes  . Smokeless tobacco: Not on file  . Alcohol Use: Yes  . Drug Use: No  . Sexual Activity:    Partners: Male   Other Topics Concern  . Not on file   Social History Narrative   Educator, works in [employer]    Past Surgical History  Procedure Laterality Date  . Breast surgery  2000    reduction  . Total abdominal hysterectomy w/ bilateral  salpingoophorectomy  2005    Family History  Problem Relation Age of Onset  . Arthritis Mother   . Hypertension Mother   . Mental illness Mother   . Emphysema Father   . COPD Sister   . Diabetes Sister   . Kidney disease Sister   . Diabetes Brother   . Hypertension Brother   . Heart disease Maternal Grandfather   . Diabetes Brother   . Hypertension Brother   . Cancer Brother     prostate  . Hypertension Brother   . Stroke Brother   . Hypertension Brother   . Cancer Maternal Aunt     esophageal ca,  history of etoh and tobacco     No Known Allergies  Current Outpatient Prescriptions on File Prior to Visit  Medication Sig Dispense Refill  . atorvastatin (LIPITOR) 10 MG tablet Take 1 tablet (10 mg total) by mouth every other day. 30 tablet 5  . Biotin (PA BIOTIN) 1000 MCG tablet Take 1,000 mcg by mouth daily.    . Coenzyme Q10 10 MG capsule Take 10 mg by mouth daily.    . fluocinonide ointment (LIDEX) 9.48 % Apply 1 application topically 2 (two) times a week.    . minoxidil (ROGAINE) 2 % external solution Apply topically 3 (three) times a week.    Marland Kitchen PREMARIN 0.3 MG tablet TAKE ONE (1) TABLET EACH DAY 30 tablet 6  . triamterene-hydrochlorothiazide (  MAXZIDE-25) 37.5-25 MG per tablet TAKE ONE (1) TABLET EACH DAY 30 tablet 3   No current facility-administered medications on file prior to visit.      Objective:   Physical Exam  Constitutional: She is oriented to person, place, and time. She appears well-developed and well-nourished. No distress.  BP 122/72 mmHg  Pulse 68  Temp(Src) 98.3 F (36.8 C)  Resp 12  Ht 5\' 7"  (1.702 m)  Wt 191 lb (86.637 kg)  BMI 29.91 kg/m2  SpO2 98%   HENT:  Head: Normocephalic and atraumatic.  Right Ear: External ear normal.  Left Ear: External ear normal.  Eyes: EOM are normal. Pupils are equal, round, and reactive to light. Right eye exhibits no discharge. Left eye exhibits no discharge. No scleral icterus.  Neck: Normal range of  motion. Neck supple.  Cardiovascular: Normal rate, regular rhythm and normal heart sounds.  Exam reveals no gallop and no friction rub.   No murmur heard. Pulmonary/Chest: Effort normal and breath sounds normal. No respiratory distress. She has no wheezes. She has no rales. She exhibits no tenderness.  Lymphadenopathy:    She has no cervical adenopathy.  Neurological: She is alert and oriented to person, place, and time. No cranial nerve deficit. She exhibits normal muscle tone. Coordination normal.  Skin: Skin is warm and dry. No rash noted. She is not diaphoretic.  Psychiatric: She has a normal mood and affect. Her behavior is normal. Judgment and thought content normal.      Assessment & Plan:

## 2014-11-27 ENCOUNTER — Ambulatory Visit: Payer: BC Managed Care – PPO | Admitting: Internal Medicine

## 2014-12-11 ENCOUNTER — Telehealth: Payer: Self-pay | Admitting: Internal Medicine

## 2014-12-11 ENCOUNTER — Ambulatory Visit: Payer: BC Managed Care – PPO | Admitting: Internal Medicine

## 2014-12-11 NOTE — Telephone Encounter (Signed)
Patient will still be charged the no show fee if the 30 minute slot is not filled by the end of the day

## 2014-12-11 NOTE — Telephone Encounter (Signed)
No charge we filled slot.

## 2014-12-11 NOTE — Telephone Encounter (Signed)
Please advise I have let front and triage know appointment is available.

## 2014-12-11 NOTE — Telephone Encounter (Signed)
Pt called to state that she is out of town and will not be able to make appt. Pt has been rescheduled. Please advise to cancel appt for today.

## 2014-12-28 ENCOUNTER — Telehealth: Payer: Self-pay | Admitting: *Deleted

## 2014-12-28 DIAGNOSIS — Z1239 Encounter for other screening for malignant neoplasm of breast: Secondary | ICD-10-CM

## 2014-12-28 NOTE — Telephone Encounter (Signed)
Left voicemail for pt to return call. Checking on mammogram status. Has it been done? Is it scheduled? When/Where, etc.?

## 2014-12-29 NOTE — Telephone Encounter (Signed)
Patient has an appointment on 01/03/15

## 2015-01-02 ENCOUNTER — Other Ambulatory Visit: Payer: Self-pay | Admitting: Internal Medicine

## 2015-01-03 ENCOUNTER — Ambulatory Visit: Payer: BC Managed Care – PPO | Admitting: Internal Medicine

## 2015-01-09 ENCOUNTER — Encounter: Payer: Self-pay | Admitting: Internal Medicine

## 2015-01-09 ENCOUNTER — Ambulatory Visit (INDEPENDENT_AMBULATORY_CARE_PROVIDER_SITE_OTHER): Payer: BC Managed Care – PPO | Admitting: Internal Medicine

## 2015-01-09 VITALS — BP 118/70 | HR 72 | Temp 97.8°F | Resp 16 | Ht 67.0 in | Wt 186.8 lb

## 2015-01-09 DIAGNOSIS — M7052 Other bursitis of knee, left knee: Secondary | ICD-10-CM | POA: Diagnosis not present

## 2015-01-09 DIAGNOSIS — Z79899 Other long term (current) drug therapy: Secondary | ICD-10-CM

## 2015-01-09 LAB — COMPREHENSIVE METABOLIC PANEL
ALT: 10 U/L (ref 0–35)
AST: 15 U/L (ref 0–37)
Albumin: 4.6 g/dL (ref 3.5–5.2)
Alkaline Phosphatase: 48 U/L (ref 39–117)
BUN: 13 mg/dL (ref 6–23)
CO2: 28 mEq/L (ref 19–32)
CREATININE: 1.13 mg/dL (ref 0.40–1.20)
Calcium: 9.9 mg/dL (ref 8.4–10.5)
Chloride: 99 mEq/L (ref 96–112)
GFR: 63.14 mL/min (ref >60.00)
Glucose, Bld: 76 mg/dL (ref 70–99)
Potassium: 3.9 mEq/L (ref 3.5–5.1)
SODIUM: 134 meq/L — AB (ref 135–145)
Total Bilirubin: 0.6 mg/dL (ref 0.2–1.2)
Total Protein: 7.3 g/dL (ref 6.0–8.3)

## 2015-01-09 MED ORDER — TRIAMCINOLONE ACETONIDE 0.1 % EX CREA: 1.0000 "application " | TOPICAL_CREAM | Freq: Two times a day (BID) | CUTANEOUS | Status: DC

## 2015-01-09 NOTE — Assessment & Plan Note (Addendum)
Pain is not severe.  No effusion.  Trial of Aleve,   Ordered  Plain films, quads and hamstrings exercises.

## 2015-01-09 NOTE — Progress Notes (Signed)
Subjective:  Patient ID: Sarah Delgado, female    DOB: 1956-07-28  Age: 59 y.o. MRN: [id number]  CC: The primary encounter diagnosis was Bursitis of left knee. A diagnosis of Long-term use of high-risk medication was also pertinent to this visit.  HPI STORIE HEFFERN presents for left knee pain and swelling for about 6 months.  phas persistent asymptomatic swelling but when she lies down, it starts   to hurt enough to take pain meds ,  Knee occasionally feesl liek it is going to give way.    Contact dermatitis Outpatient Prescriptions Prior to Visit  Medication Sig Dispense Refill  . atorvastatin (LIPITOR) 10 MG tablet Take 1 tablet (10 mg total) by mouth every other day. 30 tablet 5  . Biotin (PA BIOTIN) 1000 MCG tablet Take 1,000 mcg by mouth daily.    . Coenzyme Q10 10 MG capsule Take 10 mg by mouth daily.    . fluocinonide ointment (LIDEX) 9.56 % Apply 1 application topically 2 (two) times a week.    . minoxidil (ROGAINE) 2 % external solution Apply topically 3 (three) times a week.    Marland Kitchen PREMARIN 0.3 MG tablet TAKE ONE (1) TABLET EACH DAY 30 tablet 6  . triamterene-hydrochlorothiazide (MAXZIDE-25) 37.5-25 MG per tablet TAKE ONE (1) TABLET EACH DAY 30 tablet 5  . azithromycin (ZITHROMAX) 250 MG tablet Take 2 tablets by mouth on day 1 and then 1 tablet by mouth for 4 days. (Patient not taking: Reported on 01/09/2015) 6 tablet 0   No facility-administered medications prior to visit.    Review of Systems;  Patient denies headache, fevers, malaise, unintentional weight loss, skin rash, eye pain, sinus congestion and sinus pain, sore throat, dysphagia,  hemoptysis , cough, dyspnea, wheezing, chest pain, palpitations, orthopnea, edema, abdominal pain, nausea, melena, diarrhea, constipation, flank pain, dysuria, hematuria, urinary  Frequency, nocturia, numbness, tingling, seizures,  Focal weakness, Loss of consciousness,  Tremor, insomnia, depression, anxiety, and suicidal ideation.       Objective:  BP 118/70 mmHg  Pulse 72  Temp(Src) 97.8 F (36.6 C) (Oral)  Resp 16  Ht 5\' 7"  (1.702 m)  Wt 186 lb 12 oz (84.709 kg)  BMI 29.24 kg/m2  SpO2 96%  BP Readings from Last 3 Encounters:  01/09/15 118/70  11/15/14 122/72  03/17/14 126/64    Wt Readings from Last 3 Encounters:  01/09/15 186 lb 12 oz (84.709 kg)  11/15/14 191 lb (86.637 kg)  03/17/14 185 lb 12 oz (84.256 kg)    General appearance: alert, cooperative and appears stated age  Lungs: clear to auscultation bilaterally Heart: regular rate and rhythm, S1, S2 normal, no murmur, click, rub or gallop Abdomen: soft, non-tender; bowel sounds normal; no masses,  no organomegaly Pulses: 2+ and symmetric Skin: Skin color, texture, turgor normal. No rashes or lesions Knee:  No effusion. Mild crepitus. Bilaterally Lymph nodes: Cervical, supraclavicular, and axillary nodes normal. No results found.  Assessment & Plan:    Problem List Items Addressed This Visit    Bursitis of left knee - Primary    Pain is not severe.  No effusion.  Trial of Aleve,   Ordered  Plain films, quads and hamstrings exercises.       Relevant Orders   DG Knee Complete 4 Views Left    Other Visit Diagnoses    Long-term use of high-risk medication        Relevant Orders    Comprehensive metabolic panel (Completed)  Follow-up: Return in about 3 months (around 04/11/2015).   Crecencio Mc, MD

## 2015-01-09 NOTE — Patient Instructions (Signed)
Bursitis Bursitis is a swelling and soreness (inflammation) of a fluid-filled sac (bursa) that overlies and protects a joint. It can be caused by injury, overuse of the joint, arthritis or infection. The joints most likely to be affected are the elbows, shoulders, hips and knees. HOME CARE INSTRUCTIONS   Apply ice to the affected area for 15-20 minutes each hour while awake for 2 days. Put the ice in a plastic bag and place a towel between the bag of ice and your skin.  Rest the injured joint as much as possible, but continue to put the joint through a full range of motion, 4 times per day. (The shoulder joint especially becomes rapidly "frozen" if not used.) When the pain lessens, begin normal slow movements and usual activities.  Only take over-the-counter or prescription medicines for pain, discomfort or fever as directed by your caregiver.  Your caregiver may recommend draining the bursa and injecting medicine into the bursa. This may help the healing process.  Follow all instructions for follow-up with your caregiver. This includes any orthopedic referrals, physical therapy and rehabilitation. Any delay in obtaining necessary care could result in a delay or failure of the bursitis to heal and chronic pain. SEEK IMMEDIATE MEDICAL CARE IF:   Your pain increases even during treatment.  You develop an oral temperature above 102 F (38.9 C) and have heat and inflammation over the involved bursa. MAKE SURE YOU:   Understand these instructions.  Will watch your condition.  Will get help right away if you are not doing well or get worse. Document Released: 07/18/2000 Document Revised: 10/13/2011 Document Reviewed: 10/10/2013 [facility] Patient Information 2015 [place], [place]. This information is not intended to replace advice given to you by your health care provider. Make sure you discuss any questions you have with your health care provider.   Poison [employer] ivy is a inflammation of  the skin (contact dermatitis) caused by touching the allergens on the leaves of the ivy plant following previous exposure to the plant. The rash usually appears 48 hours after exposure. The rash is usually bumps (papules) or blisters (vesicles) in a linear pattern. Depending on your own sensitivity, the rash may simply cause redness and itching, or it may also progress to blisters which may break open. These must be well cared for to prevent secondary bacterial (germ) infection, followed by scarring. Keep any open areas dry, clean, dressed, and covered with an antibacterial ointment if needed. The eyes may also get puffy. The puffiness is worst in the morning and gets better as the day progresses. This dermatitis usually heals without scarring, within 2 to 3 weeks without treatment. HOME CARE INSTRUCTIONS  Thoroughly wash with soap and water as soon as you have been exposed to poison ivy. You have about one half hour to remove the plant resin before it will cause the rash. This washing will destroy the oil or antigen on the skin that is causing, or will cause, the rash. Be sure to wash under your fingernails as any plant resin there will continue to spread the rash. Do not rub skin vigorously when washing affected area. Poison ivy cannot spread if no oil from the plant remains on your body. A rash that has progressed to weeping sores will not spread the rash unless you have not washed thoroughly. It is also important to wash any clothes you have been wearing as these may carry active allergens. The rash will return if you wear the unwashed clothing, even several  days later. Avoidance of the plant in the future is the best measure. Poison ivy plant can be recognized by the number of leaves. Generally, poison ivy has three leaves with flowering branches on a single stem. Diphenhydramine may be purchased over the counter and used as needed for itching. Do not drive with this medication if it makes you drowsy.Ask  your caregiver about medication for children. SEEK MEDICAL CARE IF:  Open sores develop.  Redness spreads beyond area of rash.  You notice purulent (pus-like) discharge.  You have increased pain.  Other signs of infection develop (such as fever). Document Released: 07/18/2000 Document Revised: 10/13/2011 Document Reviewed: 12/29/2008 [facility] Patient Information 2015 [place], [place]. This information is not intended to replace advice given to you by your health care provider. Make sure you discuss any questions you have with your health care provider.

## 2015-01-10 ENCOUNTER — Ambulatory Visit
Admission: RE | Admit: 2015-01-10 | Discharge: 2015-01-10 | Disposition: A | Discharge status: Home / Self Care | Payer: BC Managed Care – PPO | Source: Ambulatory Visit | Attending: Internal Medicine | Admitting: Internal Medicine

## 2015-01-10 ENCOUNTER — Encounter: Payer: Self-pay | Admitting: *Deleted

## 2015-01-10 DIAGNOSIS — M1712 Unilateral primary osteoarthritis, left knee: Secondary | ICD-10-CM | POA: Insufficient documentation

## 2015-01-10 DIAGNOSIS — M7052 Other bursitis of knee, left knee: Secondary | ICD-10-CM

## 2015-01-10 DIAGNOSIS — M25562 Pain in left knee: Secondary | ICD-10-CM | POA: Diagnosis present

## 2015-01-11 ENCOUNTER — Telehealth: Payer: Self-pay | Admitting: Internal Medicine

## 2015-01-11 NOTE — Telephone Encounter (Signed)
Patient called stating the rash from the poison oak is spreading and wanted to know if there is an oral medication she can try. Please advise.

## 2015-01-12 MED ORDER — PREDNISONE 10 MG PO TABS: ORAL_TABLET | ORAL | Status: DC

## 2015-01-12 NOTE — Telephone Encounter (Signed)
Patient notified and voiced understanding.

## 2015-01-12 NOTE — Telephone Encounter (Signed)
Prednisone sent to pharamcy.  60 mg daily for 3 days,  Then decrease by 10 mg daily until gone

## 2015-02-06 ENCOUNTER — Other Ambulatory Visit: Payer: Self-pay | Admitting: Internal Medicine

## 2015-03-30 ENCOUNTER — Other Ambulatory Visit: Payer: Self-pay | Admitting: Internal Medicine

## 2015-04-12 ENCOUNTER — Other Ambulatory Visit: Payer: BC Managed Care – PPO

## 2015-04-16 ENCOUNTER — Encounter: Payer: BC Managed Care – PPO | Admitting: Internal Medicine

## 2015-04-26 ENCOUNTER — Other Ambulatory Visit (INDEPENDENT_AMBULATORY_CARE_PROVIDER_SITE_OTHER): Payer: BC Managed Care – PPO

## 2015-04-26 ENCOUNTER — Telehealth: Payer: Self-pay | Admitting: *Deleted

## 2015-04-26 DIAGNOSIS — E559 Vitamin D deficiency, unspecified: Secondary | ICD-10-CM | POA: Diagnosis not present

## 2015-04-26 DIAGNOSIS — R5383 Other fatigue: Secondary | ICD-10-CM | POA: Diagnosis not present

## 2015-04-26 DIAGNOSIS — E785 Hyperlipidemia, unspecified: Secondary | ICD-10-CM | POA: Diagnosis not present

## 2015-04-26 LAB — CBC WITH DIFFERENTIAL/PLATELET
BASOS PCT: 0.7 % (ref 0.0–3.0)
Basophils Absolute: 0.1 10*3/uL (ref 0.0–0.1)
EOS PCT: 5.2 % — AB (ref 0.0–5.0)
Eosinophils Absolute: 0.4 10*3/uL (ref 0.0–0.7)
HCT: 45.2 % (ref 36.0–46.0)
HEMOGLOBIN: 15.1 g/dL — AB (ref 12.0–15.0)
LYMPHS ABS: 3 10*3/uL (ref 0.7–4.0)
Lymphocytes Relative: 40.4 % (ref 12.0–46.0)
MCHC: 33.5 g/dL (ref 30.0–36.0)
MCV: 91.6 fl (ref 78.0–100.0)
MONOS PCT: 9 % (ref 3.0–12.0)
Monocytes Absolute: 0.7 10*3/uL (ref 0.1–1.0)
Neutro Abs: 3.3 10*3/uL (ref 1.4–7.7)
Neutrophils Relative %: 44.7 % (ref 43.0–77.0)
Platelets: 325 10*3/uL (ref 150.0–400.0)
RBC: 4.93 Mil/uL (ref 3.87–5.11)
RDW: 13.8 % (ref 11.5–15.5)
WBC: 7.4 10*3/uL (ref 4.0–10.5)

## 2015-04-26 LAB — LIPID PANEL
CHOLESTEROL: 241 mg/dL — AB (ref 0–200)
HDL: 61.8 mg/dL (ref >39.00)
LDL Cholesterol: 159 mg/dL — ABNORMAL HIGH (ref 0–99)
NonHDL: 178.85
TRIGLYCERIDES: 97 mg/dL (ref 0.0–149.0)
Total CHOL/HDL Ratio: 4
VLDL: 19.4 mg/dL (ref 0.0–40.0)

## 2015-04-26 LAB — COMPREHENSIVE METABOLIC PANEL
ALBUMIN: 4.6 g/dL (ref 3.5–5.2)
ALK PHOS: 46 U/L (ref 39–117)
ALT: 12 U/L (ref 0–35)
AST: 15 U/L (ref 0–37)
BUN: 12 mg/dL (ref 6–23)
CHLORIDE: 99 meq/L (ref 96–112)
CO2: 29 mEq/L (ref 19–32)
Calcium: 9.9 mg/dL (ref 8.4–10.5)
Creatinine, Ser: 0.92 mg/dL (ref 0.40–1.20)
GFR: 79.97 mL/min (ref >60.00)
Glucose, Bld: 95 mg/dL (ref 70–99)
POTASSIUM: 4.6 meq/L (ref 3.5–5.1)
Sodium: 136 mEq/L (ref 135–145)
TOTAL PROTEIN: 7.3 g/dL (ref 6.0–8.3)
Total Bilirubin: 0.4 mg/dL (ref 0.2–1.2)

## 2015-04-26 LAB — VITAMIN D 25 HYDROXY (VIT D DEFICIENCY, FRACTURES): VITD: 18.74 ng/mL — ABNORMAL LOW (ref 30.00–100.00)

## 2015-04-26 LAB — TSH: TSH: 1.16 u[IU]/mL (ref 0.35–4.50)

## 2015-04-26 NOTE — Telephone Encounter (Signed)
Labs and dx?  

## 2015-05-01 ENCOUNTER — Encounter: Payer: Self-pay | Admitting: *Deleted

## 2015-05-02 ENCOUNTER — Encounter (INDEPENDENT_AMBULATORY_CARE_PROVIDER_SITE_OTHER): Payer: Self-pay

## 2015-05-02 ENCOUNTER — Ambulatory Visit (INDEPENDENT_AMBULATORY_CARE_PROVIDER_SITE_OTHER): Payer: BC Managed Care – PPO | Admitting: Internal Medicine

## 2015-05-02 ENCOUNTER — Encounter: Payer: Self-pay | Admitting: Internal Medicine

## 2015-05-02 VITALS — BP 110/72 | HR 68 | Temp 98.0°F | Resp 12 | Ht 66.25 in | Wt 187.1 lb

## 2015-05-02 DIAGNOSIS — Z1239 Encounter for other screening for malignant neoplasm of breast: Secondary | ICD-10-CM

## 2015-05-02 DIAGNOSIS — E785 Hyperlipidemia, unspecified: Secondary | ICD-10-CM

## 2015-05-02 DIAGNOSIS — Z0000 Encounter for general adult medical examination without abnormal findings: Secondary | ICD-10-CM

## 2015-05-02 DIAGNOSIS — I10 Essential (primary) hypertension: Secondary | ICD-10-CM

## 2015-05-02 DIAGNOSIS — E663 Overweight: Secondary | ICD-10-CM | POA: Diagnosis not present

## 2015-05-02 DIAGNOSIS — Z23 Encounter for immunization: Secondary | ICD-10-CM | POA: Diagnosis not present

## 2015-05-02 DIAGNOSIS — Z716 Tobacco abuse counseling: Secondary | ICD-10-CM

## 2015-05-02 NOTE — Assessment & Plan Note (Signed)

## 2015-05-02 NOTE — Assessment & Plan Note (Signed)
The patient was counseled on the dangers of tobacco use, and was advised to quit.  Reviewed strategies to maximize success, including removing cigarettes and smoking materials from environment. 

## 2015-05-02 NOTE — Assessment & Plan Note (Signed)
I have addressed  BMI and recommended a low glycemic index diet utilizing smaller more frequent meals to increase metabolism.  I have also recommended that patient start exercising with a goal of 30 minutes of aerobic exercise a minimum of 5 days per week. Screening for lipid disorders, thyroid and diabetes to be done today.   

## 2015-05-02 NOTE — Patient Instructions (Signed)
I would increase your atorvastatin to every other day and return in 6 months for fasting labs  Mammogram ordered   Check with insurance about Shingles vaccine  Health Maintenance Adopting a healthy lifestyle and getting preventive care can go a long way to promote health and wellness. Talk with your health care provider about what schedule of regular examinations is right for you. This is a good chance for you to check in with your provider about disease prevention and staying healthy. In between checkups, there are plenty of things you can do on your own. Experts have done a lot of research about which lifestyle changes and preventive measures are most likely to keep you healthy. Ask your health care provider for more information. WEIGHT AND DIET  Eat a healthy diet  Be sure to include plenty of vegetables, fruits, low-fat dairy products, and lean protein.  Do not eat a lot of foods high in solid fats, added sugars, or salt.  Get regular exercise. This is one of the most important things you can do for your health.  Most adults should exercise for at least 150 minutes each week. The exercise should increase your heart rate and make you sweat (moderate-intensity exercise).  Most adults should also do strengthening exercises at least twice a week. This is in addition to the moderate-intensity exercise.  Maintain a healthy weight  Body mass index (BMI) is a measurement that can be used to identify possible weight problems. It estimates body fat based on height and weight. Your health care provider can help determine your BMI and help you achieve or maintain a healthy weight.  For females 22 years of age and older:   A BMI below 18.5 is considered underweight.  A BMI of 18.5 to 24.9 is normal.  A BMI of 25 to 29.9 is considered overweight.  A BMI of 30 and above is considered obese.  Watch levels of cholesterol and blood lipids  You should start having your blood tested for lipids  and cholesterol at 60 years of age, then have this test every 5 years.  You may need to have your cholesterol levels checked more often if:  Your lipid or cholesterol levels are high.  You are older than 60 years of age.  You are at high risk for heart disease.  CANCER SCREENING   Lung Cancer  Lung cancer screening is recommended for adults 2-37 years old who are at high risk for lung cancer because of a history of smoking.  A yearly low-dose CT scan of the lungs is recommended for people who:  Currently smoke.  Have quit within the past 15 years.  Have at least a 30-pack-year history of smoking. A pack year is smoking an average of one pack of cigarettes a day for 1 year.  Yearly screening should continue until it has been 15 years since you quit.  Yearly screening should stop if you develop a health problem that would prevent you from having lung cancer treatment.  Breast Cancer  Practice breast self-awareness. This means understanding how your breasts normally appear and feel.  It also means doing regular breast self-exams. Let your health care provider know about any changes, no matter how small.  If you are in your 20s or 30s, you should have a clinical breast exam (CBE) by a health care provider every 1-3 years as part of a regular health exam.  If you are 59 or older, have a CBE every year. Also consider having  a breast X-ray (mammogram) every year.  If you have a family history of breast cancer, talk to your health care provider about genetic screening.  If you are at high risk for breast cancer, talk to your health care provider about having an MRI and a mammogram every year.  Breast cancer gene (BRCA) assessment is recommended for women who have family members with BRCA-related cancers. BRCA-related cancers include:  Breast.  Ovarian.  Tubal.  Peritoneal cancers.  Results of the assessment will determine the need for genetic counseling and BRCA1 and  BRCA2 testing. Cervical Cancer Routine pelvic examinations to screen for cervical cancer are no longer recommended for nonpregnant women who are considered low risk for cancer of the pelvic organs (ovaries, uterus, and vagina) and who do not have symptoms. A pelvic examination may be necessary if you have symptoms including those associated with pelvic infections. Ask your health care provider if a screening pelvic exam is right for you.   The Pap test is the screening test for cervical cancer for women who are considered at risk.  If you had a hysterectomy for a problem that was not cancer or a condition that could lead to cancer, then you no longer need Pap tests.  If you are older than 65 years, and you have had normal Pap tests for the past 10 years, you no longer need to have Pap tests.  If you have had past treatment for cervical cancer or a condition that could lead to cancer, you need Pap tests and screening for cancer for at least 20 years after your treatment.  If you no longer get a Pap test, assess your risk factors if they change (such as having a new sexual partner). This can affect whether you should start being screened again.  Some women have medical problems that increase their chance of getting cervical cancer. If this is the case for you, your health care provider may recommend more frequent screening and Pap tests.  The human papillomavirus (HPV) test is another test that may be used for cervical cancer screening. The HPV test looks for the virus that can cause cell changes in the cervix. The cells collected during the Pap test can be tested for HPV.  The HPV test can be used to screen women 48 years of age and older. Getting tested for HPV can extend the interval between normal Pap tests from three to five years.  An HPV test also should be used to screen women of any age who have unclear Pap test results.  After 60 years of age, women should have HPV testing as often as  Pap tests.  Colorectal Cancer  This type of cancer can be detected and often prevented.  Routine colorectal cancer screening usually begins at 60 years of age and continues through 60 years of age.  Your health care provider may recommend screening at an earlier age if you have risk factors for colon cancer.  Your health care provider may also recommend using home test kits to check for hidden blood in the stool.  A small camera at the end of a tube can be used to examine your colon directly (sigmoidoscopy or colonoscopy). This is done to check for the earliest forms of colorectal cancer.  Routine screening usually begins at age 62.  Direct examination of the colon should be repeated every 5-10 years through 60 years of age. However, you may need to be screened more often if early forms of precancerous polyps  or small growths are found. Skin Cancer  Check your skin from head to toe regularly.  Tell your health care provider about any new moles or changes in moles, especially if there is a change in a mole's shape or color.  Also tell your health care provider if you have a mole that is larger than the size of a pencil eraser.  Always use sunscreen. Apply sunscreen liberally and repeatedly throughout the day.  Protect yourself by wearing long sleeves, pants, a wide-brimmed hat, and sunglasses whenever you are outside. HEART DISEASE, DIABETES, AND HIGH BLOOD PRESSURE   Have your blood pressure checked at least every 1-2 years. High blood pressure causes heart disease and increases the risk of stroke.  If you are between 40 years and 5 years old, ask your health care provider if you should take aspirin to prevent strokes.  Have regular diabetes screenings. This involves taking a blood sample to check your fasting blood sugar level.  If you are at a normal weight and have a low risk for diabetes, have this test once every three years after 60 years of age.  If you are overweight  and have a high risk for diabetes, consider being tested at a younger age or more often. PREVENTING INFECTION  Hepatitis B  If you have a higher risk for hepatitis B, you should be screened for this virus. You are considered at high risk for hepatitis B if:  You were born in a country where hepatitis B is common. Ask your health care provider which countries are considered high risk.  Your parents were born in a high-risk country, and you have not been immunized against hepatitis B (hepatitis B vaccine).  You have HIV or AIDS.  You use needles to inject street drugs.  You live with someone who has hepatitis B.  You have had sex with someone who has hepatitis B.  You get hemodialysis treatment.  You take certain medicines for conditions, including cancer, organ transplantation, and autoimmune conditions. Hepatitis C  Blood testing is recommended for:  Everyone born from [birth year] through 1966.  Anyone with known risk factors for hepatitis C. Sexually transmitted infections (STIs)  You should be screened for sexually transmitted infections (STIs) including gonorrhea and chlamydia if:  You are sexually active and are younger than 60 years of age.  You are older than 60 years of age and your health care provider tells you that you are at risk for this type of infection.  Your sexual activity has changed since you were last screened and you are at an increased risk for chlamydia or gonorrhea. Ask your health care provider if you are at risk.  If you do not have HIV, but are at risk, it may be recommended that you take a prescription medicine daily to prevent HIV infection. This is called pre-exposure prophylaxis (PrEP). You are considered at risk if:  You are sexually active and do not regularly use condoms or know the HIV status of your partner(s).  You take drugs by injection.  You are sexually active with a partner who has HIV. Talk with your health care provider about whether  you are at high risk of being infected with HIV. If you choose to begin PrEP, you should first be tested for HIV. You should then be tested every 3 months for as long as you are taking PrEP.  PREGNANCY   If you are premenopausal and you may become pregnant, ask your health care provider about  preconception counseling.  If you may become pregnant, take 400 to 800 micrograms (mcg) of folic acid every day.  If you want to prevent pregnancy, talk to your health care provider about birth control (contraception). OSTEOPOROSIS AND MENOPAUSE   Osteoporosis is a disease in which the bones lose minerals and strength with aging. This can result in serious bone fractures. Your risk for osteoporosis can be identified using a bone density scan.  If you are 52 years of age or older, or if you are at risk for osteoporosis and fractures, ask your health care provider if you should be screened.  Ask your health care provider whether you should take a calcium or vitamin D supplement to lower your risk for osteoporosis.  Menopause may have certain physical symptoms and risks.  Hormone replacement therapy may reduce some of these symptoms and risks. Talk to your health care provider about whether hormone replacement therapy is right for you.  HOME CARE INSTRUCTIONS   Schedule regular health, dental, and eye exams.  Stay current with your immunizations.   Do not use any tobacco products including cigarettes, chewing tobacco, or electronic cigarettes.  If you are pregnant, do not drink alcohol.  If you are breastfeeding, limit how much and how often you drink alcohol.  Limit alcohol intake to no more than 1 drink per day for nonpregnant women. One drink equals 12 ounces of beer, 5 ounces of wine, or 1 ounces of hard liquor.  Do not use street drugs.  Do not share needles.  Ask your health care provider for help if you need support or information about quitting drugs.  Tell your health care  provider if you often feel depressed.  Tell your health care provider if you have ever been abused or do not feel safe at home. Document Released: 02/03/2011 Document Revised: 12/05/2013 Document Reviewed: 06/22/2013 [facility] Patient Information 2015 [place], [place]. This information is not intended to replace advice given to you by your health care provider. Make sure you discuss any questions you have with your health care provider.

## 2015-05-02 NOTE — Assessment & Plan Note (Signed)
Lipids are not at goal, because she has reduced her atorvastatin to every 3 days,  Advised to increase use to every other day.   Lab Results  Component Value Date   CHOL 241* 04/26/2015   HDL 61.80 04/26/2015   LDLCALC 159* 04/26/2015   TRIG 97.0 04/26/2015   CHOLHDL 4 04/26/2015   Lab Results  Component Value Date   ALT 12 04/26/2015   AST 15 04/26/2015   ALKPHOS 46 04/26/2015   BILITOT 0.4 04/26/2015

## 2015-05-02 NOTE — Progress Notes (Signed)
Pre-visit discussion using our clinic review tool. No additional management support is needed unless otherwise documented below in the visit note.  

## 2015-05-02 NOTE — Assessment & Plan Note (Signed)
Well controlled on current regimen. Renal function stable, no changes today.  Lab Results  Component Value Date   CREATININE 0.92 04/26/2015   Lab Results  Component Value Date   NA 136 04/26/2015   K 4.6 04/26/2015   CL 99 04/26/2015   CO2 29 04/26/2015

## 2015-05-02 NOTE — Progress Notes (Signed)
Patient ID: Sarah Delgado, female    DOB: [birth year]  Age: 59 y.o. MRN: [id number]  The patient is here for annual wellness examination and management of other chronic and acute problems.   The risk factors are reflected in the social history.  The roster of all physicians providing medical care to patient - is listed in the Snapshot section of the chart.  Activities of daily living:  The patient is 100% independent in all ADLs: dressing, toileting, feeding as well as independent mobility  Home safety : The patient has smoke detectors in the home. They wear seatbelts.  There are no firearms at home. There is no violence in the home.   There is no risks for hepatitis, STDs or HIV. There is no   history of blood transfusion. They have no travel history to infectious disease endemic areas of the world.  The patient has seen their dentist in the last six month. They have seen their eye doctor in the last year. They admit to slight hearing difficulty with regard to whispered voices and some television programs.  They have deferred audiologic testing in the last year.  They do not  have excessive sun exposure. Discussed the need for sun protection: hats, long sleeves and use of sunscreen if there is significant sun exposure.   Diet: the importance of a healthy diet is discussed. They do have a healthy diet.  The benefits of regular aerobic exercise were discussed. Sarah Delgado walks 4 times per week ,  20 minutes.   Depression screen: there are no signs or vegative symptoms of depression- irritability, change in appetite, anhedonia, sadness/tearfullness.  Cognitive assessment: the patient manages all their financial and personal affairs and is actively engaged. They could relate day,date,year and events; recalled 2/3 objects at 3 minutes; performed clock-face test normally.  The following portions of the patient's history were reviewed and updated as appropriate: allergies, current medications, past  family history, past medical history,  past surgical history, past social history  and problem list.  Visual acuity was not assessed per patient preference since Sarah Delgado has regular follow up with Sarah Delgado ophthalmologist. Hearing and body mass index were assessed and reviewed.   During the course of the visit the patient was educated and counseled about appropriate screening and preventive services including : fall prevention , diabetes screening, nutrition counseling, colorectal cancer screening, and recommended immunizations.    CC: The primary encounter diagnosis was Hyperlipidemia LDL goal <100. Diagnoses of Breast cancer screening, Encounter for immunization, Encounter for preventive health examination, Tobacco abuse counseling, Overweight (BMI 25.0-29.9), and Essential hypertension, benign were also pertinent to this visit.  History Sarah Delgado has a past medical history of Chicken pox; Hypertension; and Hyperlipidemia.   Sarah Delgado has past surgical history that includes Breast surgery (2000) and Total abdominal hysterectomy w/ bilateral salpingoophorectomy (2005).   Sarah Delgado family history includes Arthritis in Sarah Delgado mother; COPD in Sarah Delgado sister; Cancer in Sarah Delgado brother and maternal aunt; Diabetes in Sarah Delgado brother, brother, and sister; Emphysema in Sarah Delgado father; Heart disease in Sarah Delgado maternal grandfather; Hypertension in Sarah Delgado brother, brother, brother, brother, and mother; Kidney disease in Sarah Delgado sister; Mental illness in Sarah Delgado mother; Stroke in Sarah Delgado brother.Sarah Delgado reports that Sarah Delgado has been smoking Cigarettes.  Sarah Delgado does not have any smokeless tobacco history on file. Sarah Delgado reports that Sarah Delgado drinks alcohol. Sarah Delgado reports that Sarah Delgado does not use illicit drugs.  Outpatient Prescriptions Prior to Visit  Medication Sig Dispense Refill  . atorvastatin (LIPITOR) 10 MG tablet TAKE ONE TABLET  BY MOUTH EVERY OTHER DAY 30 tablet 0  . Biotin (PA BIOTIN) 1000 MCG tablet Take 1,000 mcg by mouth daily.    . Coenzyme Q10 10 MG capsule Take 10 mg by mouth  daily.    Marland Kitchen PREMARIN 0.3 MG tablet TAKE ONE (1) TABLET BY MOUTH EVERY DAY 30 tablet 6  . triamterene-hydrochlorothiazide (MAXZIDE-25) 37.5-25 MG per tablet TAKE ONE (1) TABLET EACH DAY 30 tablet 5  . fluocinonide ointment (LIDEX) 5.10 % Apply 1 application topically 2 (two) times a week.    . minoxidil (ROGAINE) 2 % external solution Apply topically 3 (three) times a week.    . triamcinolone cream (KENALOG) 0.1 % Apply 1 application topically 2 (two) times daily. (Patient not taking: Reported on 05/02/2015) 30 g 0  . predniSONE (DELTASONE) 10 MG tablet 6 tablets daily for 3  Days,  Then decrease by 1 tablet daily until gone (Patient not taking: Reported on 05/02/2015) 33 tablet 0   No facility-administered medications prior to visit.    Review of Systems   Patient denies headache, fevers, malaise, unintentional weight loss, skin rash, eye pain, sinus congestion and sinus pain, sore throat, dysphagia,  hemoptysis , cough, dyspnea, wheezing, chest pain, palpitations, orthopnea, edema, abdominal pain, nausea, melena, diarrhea, constipation, flank pain, dysuria, hematuria, urinary  Frequency, nocturia, numbness, tingling, seizures,  Focal weakness, Loss of consciousness,  Tremor, insomnia, depression, anxiety, and suicidal ideation.      Objective:  BP 110/72 mmHg  Pulse 68  Temp(Src) 98 F (36.7 C) (Oral)  Resp 12  Ht 5' 6.25" (1.683 m)  Wt 187 lb 2 oz (84.879 kg)  BMI 29.97 kg/m2  SpO2 97%  Physical Exam  General appearance: alert, cooperative and appears stated age Head: Normocephalic, without obvious abnormality, atraumatic Eyes: conjunctivae/corneas clear. PERRL, EOM's intact. Fundi benign. Ears: normal TM's and external ear canals both ears Nose: Nares normal. Septum midline. Mucosa normal. No drainage or sinus tenderness. Throat: lips, mucosa, and tongue normal; teeth and gums normal Neck: no adenopathy, no carotid bruit, no JVD, supple, symmetrical, trachea midline and thyroid  not enlarged, symmetric, no tenderness/mass/nodules Lungs: clear to auscultation bilaterally Breasts: normal appearance, no masses or tenderness Heart: regular rate and rhythm, S1, S2 normal, no murmur, click, rub or gallop Abdomen: soft, non-tender; bowel sounds normal; no masses,  no organomegaly Extremities: extremities normal, atraumatic, no cyanosis or edema Pulses: 2+ and symmetric Skin: Skin color, texture, turgor normal. No rashes or lesions Neurologic: Alert and oriented X 3, normal strength and tone. Normal symmetric reflexes. Normal coordination and gait.    Assessment & Plan:   Problem List Items Addressed This Visit    Overweight (BMI 25.0-29.9)    I have addressed  BMI and recommended a low glycemic index diet utilizing smaller more frequent meals to increase metabolism.  I have also recommended that patient start exercising with a goal of 30 minutes of aerobic exercise a minimum of 5 days per week. Screening for lipid disorders, thyroid and diabetes to be done today.        Tobacco abuse counseling    The patient was counseled on the dangers of tobacco use, and was advised to quit.  Reviewed strategies to maximize success, including removing cigarettes and smoking materials from environment.        Essential hypertension, benign    Well controlled on current regimen. Renal function stable, no changes today.  Lab Results  Component Value Date   CREATININE 0.92 04/26/2015   Lab  Results  Component Value Date   NA 136 04/26/2015   K 4.6 04/26/2015   CL 99 04/26/2015   CO2 29 04/26/2015         Hyperlipidemia LDL goal <100 - Primary    Lipids are not at goal, because Sarah Delgado has reduced Sarah Delgado atorvastatin to every 3 days,  Advised to increase use to every other day.   Lab Results  Component Value Date   CHOL 241* 04/26/2015   HDL 61.80 04/26/2015   LDLCALC 159* 04/26/2015   TRIG 97.0 04/26/2015   CHOLHDL 4 04/26/2015   Lab Results  Component Value Date    ALT 12 04/26/2015   AST 15 04/26/2015   ALKPHOS 46 04/26/2015   BILITOT 0.4 04/26/2015         Relevant Orders   Comprehensive metabolic panel   Lipid panel   LDL cholesterol, direct   Encounter for preventive health examination    Annual wellness  exam was done as well as a comprehensive physical exam  .  During the course of the visit the patient was educated and counseled about appropriate screening and preventive services and screenings were brought up to date for cervical and breast cancer .  Sarah Delgado will return for fasting labs to provide samples for diabetes screening and lipid analysis with projected  10 year  risk for CAD. nutrition counseling, skin cancer screening has been recommended, along with review of the age appropriate recommended immunizations.  Printed recommendations for health maintenance screenings was given.         Other Visit Diagnoses    Breast cancer screening        Relevant Orders    MM DIGITAL SCREENING BILATERAL    Encounter for immunization           I have discontinued Ms. Croswell's predniSONE. I am also having Sarah Delgado maintain Sarah Delgado Coenzyme Q10, Biotin, fluocinonide ointment, minoxidil, triamterene-hydrochlorothiazide, triamcinolone cream, atorvastatin, PREMARIN, and Vitamin D (Ergocalciferol).  Meds ordered this encounter  Medications  . Vitamin D, Ergocalciferol, (DRISDOL) 50000 UNITS CAPS capsule    Sig: Take 50,000 Units by mouth every 7 (seven) days.    Medications Discontinued During This Encounter  Medication Reason  . predniSONE (DELTASONE) 10 MG tablet Completed Course    Follow-up: Return in about 6 months (around 10/30/2015).   Crecencio Mc, MD

## 2015-05-09 ENCOUNTER — Telehealth: Payer: Self-pay | Admitting: *Deleted

## 2015-05-09 MED ORDER — VITAMIN D (ERGOCALCIFEROL) 1.25 MG (50000 UNIT) PO CAPS: 50000.0000 [IU] | ORAL_CAPSULE | ORAL | Status: DC

## 2015-05-09 NOTE — Telephone Encounter (Signed)
Patient stated that she was to get prescription for vitamin D, patient called pharmacy and medication has not been sent over. Patient was requesting a update on the prescription.

## 2015-05-09 NOTE — Telephone Encounter (Signed)
Script sent for VIT D

## 2015-05-14 ENCOUNTER — Ambulatory Visit
Admission: RE | Admit: 2015-05-14 | Discharge: 2015-05-14 | Disposition: A | Discharge status: Home / Self Care | Payer: BC Managed Care – PPO | Source: Ambulatory Visit | Attending: Internal Medicine | Admitting: Internal Medicine

## 2015-05-14 DIAGNOSIS — Z1239 Encounter for other screening for malignant neoplasm of breast: Secondary | ICD-10-CM

## 2015-05-14 DIAGNOSIS — Z1231 Encounter for screening mammogram for malignant neoplasm of breast: Secondary | ICD-10-CM | POA: Diagnosis not present

## 2015-05-17 ENCOUNTER — Other Ambulatory Visit: Payer: Self-pay | Admitting: Internal Medicine

## 2015-05-17 ENCOUNTER — Encounter: Payer: Self-pay | Admitting: *Deleted

## 2015-05-31 ENCOUNTER — Emergency Department
Admission: EM | Admit: 2015-05-31 | Discharge: 2015-05-31 | Disposition: A | Discharge status: Home / Self Care | Payer: BC Managed Care – PPO | Attending: Emergency Medicine | Admitting: Emergency Medicine

## 2015-05-31 ENCOUNTER — Encounter: Payer: Self-pay | Admitting: Emergency Medicine

## 2015-05-31 ENCOUNTER — Emergency Department: Payer: BC Managed Care – PPO

## 2015-05-31 DIAGNOSIS — I10 Essential (primary) hypertension: Secondary | ICD-10-CM | POA: Insufficient documentation

## 2015-05-31 DIAGNOSIS — R42 Dizziness and giddiness: Secondary | ICD-10-CM | POA: Insufficient documentation

## 2015-05-31 DIAGNOSIS — Z720 Tobacco use: Secondary | ICD-10-CM | POA: Insufficient documentation

## 2015-05-31 DIAGNOSIS — R112 Nausea with vomiting, unspecified: Secondary | ICD-10-CM | POA: Diagnosis not present

## 2015-05-31 LAB — CBC WITH DIFFERENTIAL/PLATELET
BASOS ABS: 0.1 10*3/uL (ref 0–0.1)
BASOS PCT: 1 %
EOS ABS: 0.3 10*3/uL (ref 0–0.7)
EOS PCT: 5 %
HCT: 41.5 % (ref 35.0–47.0)
HEMOGLOBIN: 13.9 g/dL (ref 12.0–16.0)
LYMPHS ABS: 2.4 10*3/uL (ref 1.0–3.6)
Lymphocytes Relative: 39 %
MCH: 30.8 pg (ref 26.0–34.0)
MCHC: 33.5 g/dL (ref 32.0–36.0)
MCV: 91.9 fL (ref 80.0–100.0)
Monocytes Absolute: 0.5 10*3/uL (ref 0.2–0.9)
Monocytes Relative: 8 %
NEUTROS PCT: 47 %
Neutro Abs: 2.8 10*3/uL (ref 1.4–6.5)
PLATELETS: 304 10*3/uL (ref 150–440)
RBC: 4.52 MIL/uL (ref 3.80–5.20)
RDW: 13.5 % (ref 11.5–14.5)
WBC: 6 10*3/uL (ref 3.6–11.0)

## 2015-05-31 LAB — COMPREHENSIVE METABOLIC PANEL
ALBUMIN: 4.2 g/dL (ref 3.5–5.0)
ALK PHOS: 52 U/L (ref 38–126)
ALT: 15 U/L (ref 14–54)
AST: 20 U/L (ref 15–41)
Anion gap: 8 (ref 5–15)
BUN: 21 mg/dL — AB (ref 6–20)
CALCIUM: 9.1 mg/dL (ref 8.9–10.3)
CHLORIDE: 102 mmol/L (ref 101–111)
CO2: 27 mmol/L (ref 22–32)
CREATININE: 0.86 mg/dL (ref 0.44–1.00)
GFR calc Af Amer: >60 mL/min (ref >60)
GFR calc non Af Amer: >60 mL/min (ref >60)
GLUCOSE: 146 mg/dL — AB (ref 65–99)
Potassium: 3.9 mmol/L (ref 3.5–5.1)
SODIUM: 137 mmol/L (ref 135–145)
Total Bilirubin: 0.3 mg/dL (ref 0.3–1.2)
Total Protein: 7 g/dL (ref 6.5–8.1)

## 2015-05-31 LAB — TSH: TSH: 1.059 u[IU]/mL (ref 0.350–4.500)

## 2015-05-31 LAB — TROPONIN I: Troponin I: <0.03 ng/mL (ref <0.031)

## 2015-05-31 MED ORDER — SODIUM CHLORIDE 0.9 % IV SOLN
1000.0000 mL | Freq: Once | INTRAVENOUS | Status: AC
  Administered 2015-05-31: 1000 mL via INTRAVENOUS

## 2015-05-31 MED ORDER — MECLIZINE HCL 25 MG PO TABS: 25.0000 mg | ORAL_TABLET | Freq: Three times a day (TID) | ORAL | Status: DC | PRN

## 2015-05-31 MED ORDER — ONDANSETRON HCL 4 MG/2ML IJ SOLN
4.0000 mg | Freq: Once | INTRAMUSCULAR | Status: AC
  Administered 2015-05-31: 4 mg via INTRAVENOUS
  Filled 2015-05-31: qty 2

## 2015-05-31 MED ORDER — DIAZEPAM 5 MG PO TABS: 5.0000 mg | ORAL_TABLET | Freq: Three times a day (TID) | ORAL | Status: DC | PRN

## 2015-05-31 MED ORDER — MECLIZINE HCL 25 MG PO TABS
50.0000 mg | ORAL_TABLET | Freq: Once | ORAL | Status: AC
  Administered 2015-05-31: 50 mg via ORAL
  Filled 2015-05-31: qty 2

## 2015-05-31 MED ORDER — DIAZEPAM 5 MG PO TABS
5.0000 mg | ORAL_TABLET | Freq: Once | ORAL | Status: AC
  Administered 2015-05-31: 5 mg via ORAL
  Filled 2015-05-31: qty 1

## 2015-05-31 NOTE — ED Provider Notes (Signed)
[facility] Emergency Department Provider Note     Time seen: ----------------------------------------- 7:25 AM on 05/31/2015 -----------------------------------------    I have reviewed the triage vital signs and the nursing notes.   HISTORY  Chief Complaint Dizziness and Nausea    HPI Sarah Delgado is a 59 y.o. female who presents ER for acute onset of dizziness upon awakening this morning. Patient describes this as the room is spinning, felt hot and nauseous. She was also vomiting, has never had this happen before. Patient states she's had dizziness before but never anything like this. She states she woke up with cramps in her thighs which is normal for her, then proceeded to have acute vertigo symptoms.   Past Medical History  Diagnosis Date  . Chicken pox   . Hypertension   . Hyperlipidemia     Patient Active Problem List   Diagnosis Date Noted  . Encounter for preventive health examination 03/19/2014  . Menopausal syndrome on hormone replacement therapy 12/28/2013  . S/P total hysterectomy and bilateral salpingo-oophorectomy 06/29/2013  . Overweight (BMI 25.0-29.9) 09/16/2012  . Tobacco abuse 09/16/2012  . Tobacco abuse counseling 09/16/2012  . Essential hypertension, benign 09/16/2012  . Hyperlipidemia LDL goal <100 09/16/2012    Past Surgical History  Procedure Laterality Date  . Breast surgery  2000    reduction  . Total abdominal hysterectomy w/ bilateral salpingoophorectomy  2005  . Reduction mammaplasty Bilateral     at least 15 years ago    Allergies Review of patient's allergies indicates no known allergies.  Social History Social History  Substance Use Topics  . Smoking status: Current Some Day Smoker    Types: Cigarettes  . Smokeless tobacco: Not on file  . Alcohol Use: Yes    Review of Systems Constitutional: Negative for fever. Eyes: Negative for visual changes. ENT: Negative for sore  throat. Cardiovascular: Negative for chest pain. Respiratory: Negative for shortness of breath. Gastrointestinal: Negative for abdominal pain, positive for vomiting. Genitourinary: Negative for dysuria. Musculoskeletal: Negative for back pain. Skin: Negative for rash. Neurological: Negative for headaches, focal weakness or numbness. Positive for dizziness  10-point ROS otherwise negative.  ____________________________________________   PHYSICAL EXAM:  VITAL SIGNS: ED Triage Vitals  Enc Vitals Group     BP --      Pulse --      Resp --      Temp --      Temp src --      SpO2 05/31/15 0722 99 %     Weight --      Height --      Head Cir --      Peak Flow --      Pain Score --      Pain Loc --      Pain Edu? --      Excl. in [place]? --     Constitutional: Alert and oriented. Well appearing and in no distress. Eyes: Conjunctivae are normal. PERRL. Normal extraocular movements. No nystagmus ENT   Head: Normocephalic and atraumatic.   Nose: No congestion/rhinnorhea.   Mouth/Throat: Mucous membranes are moist.   Neck: No stridor. Cardiovascular: Normal rate, regular rhythm. Normal and symmetric distal pulses are present in all extremities. No murmurs, rubs, or gallops. Respiratory: Normal respiratory effort without tachypnea nor retractions. Breath sounds are clear and equal bilaterally. No wheezes/rales/rhonchi. Gastrointestinal: Soft and nontender. No distention. No abdominal bruits.  Musculoskeletal: Nontender with normal range of motion in all extremities. No joint  effusions.  No lower extremity tenderness nor edema. Neurologic:  Normal speech and language. No gross focal neurologic deficits are appreciated. Speech is normal. No gait instability. Skin:  Skin is warm, dry and intact. No rash noted. Psychiatric: Mood and affect are normal. Speech and behavior are normal. Patient exhibits appropriate insight and  judgment. ____________________________________________  EKG: Interpreted by me. Normal sinus rhythm with a rate of 71 bpm, normal PR interval, normal QRS with, normal QT interval. Normal axis.  ____________________________________________  ED COURSE:  Pertinent labs & imaging results that were available during my care of the patient were reviewed by me and considered in my medical decision making (see chart for details). Patient is in no acute distress, exhibiting vertigo symptoms. Will receive meclizine, Valium and fluids. ____________________________________________    LABS (pertinent positives/negatives)  Labs Reviewed  COMPREHENSIVE METABOLIC PANEL - Abnormal; Notable for the following:    Glucose, Bld 146 (*)    BUN 21 (*)    All other components within normal limits  CBC WITH DIFFERENTIAL/PLATELET  TROPONIN I  TSH    RADIOLOGY Images were viewed by me  CT head IMPRESSION: Evidence of a degree of empty sella, stable. Slight small vessel disease in the centra semiovale bilaterally. 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 No new gray-white compartment lesions. No acute infarct. No hemorrhage or mass effect. ____________________________________________  FINAL ASSESSMENT AND PLAN  Vertigo  Plan: Patient with labs and imaging as dictated above. Patient is feeling better after Valium and meclizine. She is able to tolerate liquids by mouth. She'll be discharged with meclizine and Valium and referred to ENT for follow-up.   Earleen Newport, MD   Earleen Newport, MD 05/31/15 [phone]

## 2015-05-31 NOTE — ED Notes (Signed)
Pt ambulated to use restroom without assistance, pt states she felt better and did not have any problems

## 2015-05-31 NOTE — ED Notes (Signed)
Pt woke up with "charlie horse cramps in thighs" which radiate down legs. Pt states she took mustard and salt and cramps resolved; woke up a couple of hours later with complaints of being hot and room spinning. Pt reports being nauseas, vomiting as well.  Pt is A/Ox4, vital signs WDL, no immediate distress at this time.

## 2015-05-31 NOTE — Discharge Instructions (Signed)

## 2015-05-31 NOTE — ED Notes (Signed)
Pt discharged home after verbalizing understanding of discharge instructions; nad noted. 

## 2015-05-31 NOTE — ED Notes (Signed)
Patient transported to CT 

## 2015-06-04 ENCOUNTER — Telehealth: Payer: Self-pay | Admitting: Internal Medicine

## 2015-06-04 DIAGNOSIS — H8110 Benign paroxysmal vertigo, unspecified ear: Secondary | ICD-10-CM

## 2015-06-04 NOTE — Telephone Encounter (Signed)
Your referral is in process as requested. Our referral coordinator will call you when the appointment has been made.  

## 2015-06-04 NOTE — Telephone Encounter (Signed)
Patient notified

## 2015-07-19 ENCOUNTER — Other Ambulatory Visit: Payer: Self-pay | Admitting: Internal Medicine

## 2015-08-07 ENCOUNTER — Other Ambulatory Visit: Payer: Self-pay | Admitting: Otolaryngology

## 2015-08-07 DIAGNOSIS — R42 Dizziness and giddiness: Secondary | ICD-10-CM

## 2015-08-08 ENCOUNTER — Ambulatory Visit: Payer: BC Managed Care – PPO | Admitting: Internal Medicine

## 2015-08-29 ENCOUNTER — Ambulatory Visit
Admission: RE | Admit: 2015-08-29 | Discharge: 2015-08-29 | Disposition: A | Discharge status: Home / Self Care | Payer: Managed Care, Other (non HMO) | Source: Ambulatory Visit | Attending: Otolaryngology | Admitting: Otolaryngology

## 2015-08-29 DIAGNOSIS — R51 Headache: Secondary | ICD-10-CM | POA: Diagnosis not present

## 2015-08-29 DIAGNOSIS — R42 Dizziness and giddiness: Secondary | ICD-10-CM | POA: Insufficient documentation

## 2015-08-29 MED ORDER — GADOBENATE DIMEGLUMINE 529 MG/ML IV SOLN
20.0000 mL | Freq: Once | INTRAVENOUS | Status: AC | PRN
  Administered 2015-08-29: 17 mL via INTRAVENOUS

## 2015-09-20 DIAGNOSIS — L669 Cicatricial alopecia, unspecified: Secondary | ICD-10-CM | POA: Insufficient documentation

## 2015-11-14 ENCOUNTER — Other Ambulatory Visit: Payer: Self-pay | Admitting: Internal Medicine

## 2015-12-29 ENCOUNTER — Other Ambulatory Visit: Payer: Self-pay | Admitting: Internal Medicine

## 2016-02-20 ENCOUNTER — Other Ambulatory Visit: Payer: Self-pay | Admitting: Internal Medicine

## 2016-03-28 ENCOUNTER — Other Ambulatory Visit: Payer: Self-pay | Admitting: *Deleted

## 2016-03-28 MED ORDER — TRIAMTERENE-HCTZ 37.5-25 MG PO TABS: ORAL_TABLET | ORAL | 0 refills | Status: DC

## 2016-03-28 NOTE — Progress Notes (Unsigned)
Refilled Maxzide but notified patient needs OV for further refills, patient scheduled 04/28/16

## 2016-04-28 ENCOUNTER — Encounter: Payer: Self-pay | Admitting: Internal Medicine

## 2016-04-28 ENCOUNTER — Encounter (INDEPENDENT_AMBULATORY_CARE_PROVIDER_SITE_OTHER): Payer: Self-pay

## 2016-04-28 ENCOUNTER — Ambulatory Visit (INDEPENDENT_AMBULATORY_CARE_PROVIDER_SITE_OTHER): Payer: BC Managed Care – PPO | Admitting: Internal Medicine

## 2016-04-28 VITALS — BP 140/74 | HR 66 | Temp 97.6°F | Resp 12 | Ht 66.0 in | Wt 189.2 lb

## 2016-04-28 DIAGNOSIS — E785 Hyperlipidemia, unspecified: Secondary | ICD-10-CM

## 2016-04-28 DIAGNOSIS — Z23 Encounter for immunization: Secondary | ICD-10-CM

## 2016-04-28 DIAGNOSIS — Z0000 Encounter for general adult medical examination without abnormal findings: Secondary | ICD-10-CM | POA: Diagnosis not present

## 2016-04-28 DIAGNOSIS — Z205 Contact with and (suspected) exposure to viral hepatitis: Secondary | ICD-10-CM | POA: Insufficient documentation

## 2016-04-28 DIAGNOSIS — R7303 Prediabetes: Secondary | ICD-10-CM | POA: Diagnosis not present

## 2016-04-28 DIAGNOSIS — I10 Essential (primary) hypertension: Secondary | ICD-10-CM

## 2016-04-28 DIAGNOSIS — Z79899 Other long term (current) drug therapy: Secondary | ICD-10-CM | POA: Diagnosis not present

## 2016-04-28 DIAGNOSIS — Z1239 Encounter for other screening for malignant neoplasm of breast: Secondary | ICD-10-CM | POA: Diagnosis not present

## 2016-04-28 DIAGNOSIS — E669 Obesity, unspecified: Secondary | ICD-10-CM

## 2016-04-28 DIAGNOSIS — E559 Vitamin D deficiency, unspecified: Secondary | ICD-10-CM

## 2016-04-28 NOTE — Progress Notes (Signed)
Review of Systems  Physical Exam

## 2016-04-28 NOTE — Patient Instructions (Addendum)
Return in 4 to 6 weeks (after you have resumed taking  Lipitor the way you WERE taking it)   We will test you for Hepatitis C again just gto be sure,  But you were tested in 2015 and it was negative   Take the triamterene in the morning.  The other meds can be taken at dinner time  Mammogram to be ordered  Colonoscopy due in 2021  Congrats on the weight loss!!!    Avoid bananas, pineapple, grapes  and watermelon on a regular basis because they are high in sugar.  THINK OF THEM AS DESSERT  Remember that snack Substitutions should be less than 10 NET carbs per serving and meals < 20 carbs. Remember to subtract fiber grams to get the "net carbs."  Here are some great low carb breads you can  Buy locally

## 2016-04-28 NOTE — Assessment & Plan Note (Signed)
Annual comprehensive preventive exam was done as well as an evaluation and management of chronic conditions .  During the course of the visit the patient was educated and counseled about appropriate screening and preventive services including :  diabetes screening, lipid analysis with projected  10 year  risk for CAD , nutrition counseling, breast, cervical and colorectal cancer screening, and recommended immunizations.  Printed recommendations for health maintenance screenings was given 

## 2016-04-28 NOTE — Progress Notes (Signed)
Pre-visit discussion using our clinic review tool. No additional management support is needed unless otherwise documented below in the visit note.  

## 2016-04-28 NOTE — Assessment & Plan Note (Signed)
Her boyrfriedn has been treated for Hepatitis C.  Repeat testing is requested

## 2016-04-28 NOTE — Assessment & Plan Note (Signed)
I have addressed  BMI and recommended wt loss of 10% of body weight over the next 6 months using a low fat, low starch, high protein  fruit/vegetable based Mediterranean diet and 30 minutes of aerobic exercise a minimum of 5 days per week.   

## 2016-04-28 NOTE — Assessment & Plan Note (Signed)
Advised to resume lipitor every other day and return for fasting labs

## 2016-04-28 NOTE — Progress Notes (Signed)
Patient ID: Sarah Delgado, female    DOB: 01-20-1956  Age: 60 y.o. MRN: [id number]  The patient is here for annual preventive examination and management of other chronic and acute problems.   The risk factors are reflected in the social history.  Boyfriend was  diagnosed and treated for Hep C   wants to be tested again  Having an episide of vertigo . In October and MRI done by [facility] of all physicians providing medical care to patient - is listed in the Snapshot section of the chart.   Home safety : The patient has smoke detectors in the home. They wear seatbelts.  There are no firearms at home. There is no violence in the home.   There is no risks for hepatitis, STDs or HIV. There is no   history of blood transfusion. They have no travel history to infectious disease endemic areas of the world.  The patient has seen their dentist in the last six month. They have seen their eye doctor in the last year.     They do not  have excessive sun exposure. Discussed the need for sun protection: hats, long sleeves and use of sunscreen if there is significant sun exposure.   Diet: the importance of a healthy diet is discussed. They do have a healthy diet.  The benefits of regular aerobic exercise were discussed. She walks 4 times per week ,  20 minutes.   Depression screen: there are no signs or vegative symptoms of depression- I program at work , change in appetite, anhedonia, sadness/tearfullness.   The following portions of the patient's history were reviewed and updated as appropriate: allergies, current medications, past family history, past medical history,  past surgical history, past social history  and problem list.  Visual acuity was not assessed per patient preference since she has regular follow up with her ophthalmologist. Hearing and body mass index were assessed and reviewed.   During the course of the visit the patient was educated and counseled about appropriate  screening and preventive services including : fall prevention , diabetes screening, nutrition counseling, colorectal cancer screening, and recommended immunizations.    CC: The primary encounter diagnosis was Prediabetes. Diagnoses of Encounter for immunization, Exposure to hepatitis C, Long-term use of high-risk medication, Hyperlipidemia, Vitamin D deficiency, Breast cancer screening, Obesity, Hyperlipidemia LDL goal <100, Encounter for preventive health examination, and Essential hypertension, benign were also pertinent to this visit.   Weight gain. Has lost 12 lbs in the last 8 months attending a diabetes education   History Sarah Delgado has a past medical history of Chicken pox; Hyperlipidemia; and Hypertension.   She has a past surgical history that includes Breast surgery (2000); Total abdominal hysterectomy w/ bilateral salpingoophorectomy (2005); Reduction mammaplasty (Bilateral); and Abdominal hysterectomy.   Her family history includes Arthritis in her mother; COPD in her sister; Cancer in her brother and maternal aunt; Diabetes in her brother, brother, and sister; Emphysema in her father; Heart disease in her maternal grandfather; Hypertension in her brother, brother, brother, brother, and mother; Kidney disease in her sister; Mental illness in her mother; Stroke in her brother.She reports that she has been smoking Cigarettes.  She does not have any smokeless tobacco history on file. She reports that she drinks alcohol. She reports that she does not use drugs.  Outpatient Medications Prior to Visit  Medication Sig Dispense Refill  . atorvastatin (LIPITOR) 10 MG tablet TAKE ONE (1) TABLET BY MOUTH EVERY OTHER  DAY 30 tablet 6  . Biotin (PA BIOTIN) 1000 MCG tablet Take 1,000 mcg by mouth daily.    . Coenzyme Q10 10 MG capsule Take 10 mg by mouth daily.    Marland Kitchen PREMARIN 0.3 MG tablet TAKE ONE (1) TABLET BY MOUTH EVERY DAY 30 tablet 2  . triamterene-hydrochlorothiazide (MAXZIDE-25) 37.5-25 MG  tablet TAKE ONE (1) TABLET BY MOUTH EVERY DAY 30 tablet 0  . diazepam (VALIUM) 5 MG tablet Take 1 tablet (5 mg total) by mouth every 8 (eight) hours as needed (to be taken with meclizine). (Patient not taking: Reported on 04/28/2016) 20 tablet 0  . meclizine (ANTIVERT) 25 MG tablet Take 1 tablet (25 mg total) by mouth 3 (three) times daily as needed for dizziness or nausea. (Patient not taking: Reported on 04/28/2016) 30 tablet 1  . Vitamin D, Ergocalciferol, (DRISDOL) 50000 UNITS CAPS capsule Take 1 capsule (50,000 Units total) by mouth every 7 (seven) days. (Patient not taking: Reported on 04/28/2016) 12 capsule 0   No facility-administered medications prior to visit.     Review of Systems   Patient denies headache, fevers, malaise, unintentional weight loss, skin rash, eye pain, sinus congestion and sinus pain, sore throat, dysphagia,  hemoptysis , cough, dyspnea, wheezing, chest pain, palpitations, orthopnea, edema, abdominal pain, nausea, melena, diarrhea, constipation, flank pain, dysuria, hematuria, urinary  Frequency, nocturia, numbness, tingling, seizures,  Focal weakness, Loss of consciousness,  Tremor, insomnia, depression, anxiety, and suicidal ideation.      Objective:  BP 140/74   Pulse 66   Temp 97.6 F (36.4 C) (Oral)   Resp 12   Ht 5\' 6"  (1.676 m)   Wt 189 lb 4 oz (85.8 kg)   SpO2 98%   BMI 30.55 kg/m   Physical Exam   General appearance: alert, cooperative and appears stated age Head: Normocephalic, without obvious abnormality, atraumatic Eyes: conjunctivae/corneas clear. PERRL, EOM's intact. Fundi benign. Ears: normal TM's and external ear canals both ears Nose: Nares normal. Septum midline. Mucosa normal. No drainage or sinus tenderness. Throat: lips, mucosa, and tongue normal; teeth and gums normal Neck: no adenopathy, no carotid bruit, no JVD, supple, symmetrical, trachea midline and thyroid not enlarged, symmetric, no tenderness/mass/nodules Lungs: clear to  auscultation bilaterally Breasts: normal appearance, no masses or tenderness Heart: regular rate and rhythm, S1, S2 normal, no murmur, click, rub or gallop Abdomen: soft, non-tender; bowel sounds normal; no masses,  no organomegaly Extremities: extremities normal, atraumatic, no cyanosis or edema Pulses: 2+ and symmetric Skin: Skin color, texture, turgor normal. No rashes or lesions Neurologic: Alert and oriented X 3, normal strength and tone. Normal symmetric reflexes. Normal coordination and gait.     Assessment & Plan:   Problem List Items Addressed This Visit    Obesity    I have addressed  BMI and recommended wt loss of 10% of body weight over the next 6 months using a low fat, low starch, high protein  fruit/vegetable based Mediterranean diet and 30 minutes of aerobic exercise a minimum of 5 days per week.        Essential hypertension, benign    Well controlled on current regimen. Renal function is due  no changes today.      Relevant Medications   aspirin EC 81 MG tablet   Hyperlipidemia LDL goal <100    Advised to resume lipitor every other day and return for fasting labs      Relevant Medications   aspirin EC 81 MG tablet   Encounter  for preventive health examination    Annual comprehensive preventive exam was done as well as an evaluation and management of chronic conditions .  During the course of the visit the patient was educated and counseled about appropriate screening and preventive services including :  diabetes screening, lipid analysis with projected  10 year  risk for CAD , nutrition counseling, breast, cervical and colorectal cancer screening, and recommended immunizations.  Printed recommendations for health maintenance screenings was given      Exposure to hepatitis C    Her boyrfriedn has been treated for Hepatitis C.  Repeat testing is requested      Relevant Orders   Hepatitis C antibody    Other Visit Diagnoses    Prediabetes    -  Primary    Relevant Orders   Hemoglobin A1c   Encounter for immunization       Relevant Orders   Flu Vaccine QUAD 36+ mos IM (Completed)   Long-term use of high-risk medication       Relevant Orders   Comprehensive metabolic panel   Hyperlipidemia       Relevant Medications   aspirin EC 81 MG tablet   Other Relevant Orders   Lipid panel   Vitamin D deficiency       Relevant Orders   VITAMIN D 25 Hydroxy (Vit-D Deficiency, Fractures)   Breast cancer screening       Relevant Orders   MM DIGITAL SCREENING BILATERAL      I am having Sarah Delgado maintain her Coenzyme Q10, Biotin, Vitamin D (Ergocalciferol), atorvastatin, meclizine, diazepam, PREMARIN, triamterene-hydrochlorothiazide, Vitamin D-3, and aspirin EC.  Meds ordered this encounter  Medications  . Cholecalciferol (VITAMIN D-3) 1000 units CAPS    Sig: Take 1 capsule by mouth daily.  Marland Kitchen aspirin EC 81 MG tablet    Sig: Take 81 mg by mouth daily.    There are no discontinued medications.  Follow-up: Return in about 6 months (around 10/26/2016), or fasitng labs 4-6 weeks .   Crecencio Mc, MD

## 2016-04-28 NOTE — Assessment & Plan Note (Signed)
Well controlled on current regimen. Renal function  is due; no changes today.  

## 2016-04-30 ENCOUNTER — Encounter: Payer: Self-pay | Admitting: *Deleted

## 2016-05-01 ENCOUNTER — Other Ambulatory Visit: Payer: Self-pay | Admitting: Internal Medicine

## 2016-05-07 NOTE — Telephone Encounter (Signed)
Unread mychart message mailed to patient 

## 2016-05-20 ENCOUNTER — Ambulatory Visit
Admission: RE | Admit: 2016-05-20 | Discharge: 2016-05-20 | Disposition: A | Discharge status: Home / Self Care | Payer: Managed Care, Other (non HMO) | Source: Ambulatory Visit | Attending: Internal Medicine | Admitting: Internal Medicine

## 2016-05-20 DIAGNOSIS — Z1231 Encounter for screening mammogram for malignant neoplasm of breast: Secondary | ICD-10-CM | POA: Diagnosis not present

## 2016-05-29 ENCOUNTER — Other Ambulatory Visit (INDEPENDENT_AMBULATORY_CARE_PROVIDER_SITE_OTHER): Payer: BC Managed Care – PPO

## 2016-05-29 DIAGNOSIS — E785 Hyperlipidemia, unspecified: Secondary | ICD-10-CM

## 2016-05-29 DIAGNOSIS — E559 Vitamin D deficiency, unspecified: Secondary | ICD-10-CM

## 2016-05-29 DIAGNOSIS — Z79899 Other long term (current) drug therapy: Secondary | ICD-10-CM | POA: Diagnosis not present

## 2016-05-29 DIAGNOSIS — Z205 Contact with and (suspected) exposure to viral hepatitis: Secondary | ICD-10-CM

## 2016-05-29 DIAGNOSIS — R7303 Prediabetes: Secondary | ICD-10-CM | POA: Diagnosis not present

## 2016-05-29 LAB — COMPREHENSIVE METABOLIC PANEL
ALBUMIN: 4.6 g/dL (ref 3.5–5.2)
ALT: 13 U/L (ref 0–35)
AST: 19 U/L (ref 0–37)
Alkaline Phosphatase: 46 U/L (ref 39–117)
BILIRUBIN TOTAL: 0.5 mg/dL (ref 0.2–1.2)
BUN: 17 mg/dL (ref 6–23)
CALCIUM: 10.4 mg/dL (ref 8.4–10.5)
CHLORIDE: 99 meq/L (ref 96–112)
CO2: 29 meq/L (ref 19–32)
CREATININE: 1 mg/dL (ref 0.40–1.20)
GFR: 72.37 mL/min (ref >60.00)
Glucose, Bld: 89 mg/dL (ref 70–99)
Potassium: 4.3 mEq/L (ref 3.5–5.1)
Sodium: 136 mEq/L (ref 135–145)
Total Protein: 7.7 g/dL (ref 6.0–8.3)

## 2016-05-29 LAB — LIPID PANEL
CHOL/HDL RATIO: 3
Cholesterol: 230 mg/dL — ABNORMAL HIGH (ref 0–200)
HDL: 67.1 mg/dL (ref >39.00)
LDL Cholesterol: 143 mg/dL — ABNORMAL HIGH (ref 0–99)
NonHDL: 163.13
TRIGLYCERIDES: 100 mg/dL (ref 0.0–149.0)
VLDL: 20 mg/dL (ref 0.0–40.0)

## 2016-05-29 LAB — HEMOGLOBIN A1C: Hgb A1c MFr Bld: 6.1 % (ref 4.6–6.5)

## 2016-05-30 LAB — HEPATITIS C ANTIBODY: HCV Ab: NEGATIVE

## 2016-05-31 ENCOUNTER — Encounter: Payer: Self-pay | Admitting: Internal Medicine

## 2016-06-02 LAB — VITAMIN D 25 HYDROXY (VIT D DEFICIENCY, FRACTURES): VITD: 39.2 ng/mL (ref 30.00–100.00)

## 2016-06-03 ENCOUNTER — Encounter: Payer: Self-pay | Admitting: Internal Medicine

## 2016-06-04 ENCOUNTER — Other Ambulatory Visit: Payer: Self-pay | Admitting: Internal Medicine

## 2016-10-27 ENCOUNTER — Ambulatory Visit: Payer: BC Managed Care – PPO | Admitting: Internal Medicine

## 2016-11-18 ENCOUNTER — Other Ambulatory Visit: Payer: Self-pay | Admitting: Internal Medicine

## 2016-12-09 ENCOUNTER — Encounter: Payer: Self-pay | Admitting: Internal Medicine

## 2016-12-09 ENCOUNTER — Ambulatory Visit (INDEPENDENT_AMBULATORY_CARE_PROVIDER_SITE_OTHER): Payer: Managed Care, Other (non HMO) | Admitting: Internal Medicine

## 2016-12-09 VITALS — BP 118/76 | HR 68 | Temp 98.1°F | Resp 16 | Ht 66.0 in | Wt 198.0 lb

## 2016-12-09 DIAGNOSIS — I10 Essential (primary) hypertension: Secondary | ICD-10-CM | POA: Diagnosis not present

## 2016-12-09 DIAGNOSIS — Z8669 Personal history of other diseases of the nervous system and sense organs: Secondary | ICD-10-CM | POA: Diagnosis not present

## 2016-12-09 DIAGNOSIS — E6609 Other obesity due to excess calories: Secondary | ICD-10-CM | POA: Diagnosis not present

## 2016-12-09 DIAGNOSIS — E785 Hyperlipidemia, unspecified: Secondary | ICD-10-CM

## 2016-12-09 DIAGNOSIS — Z720 Tobacco use: Secondary | ICD-10-CM | POA: Diagnosis not present

## 2016-12-09 DIAGNOSIS — R7303 Prediabetes: Secondary | ICD-10-CM | POA: Diagnosis not present

## 2016-12-09 DIAGNOSIS — Z6832 Body mass index (BMI) 32.0-32.9, adult: Secondary | ICD-10-CM

## 2016-12-09 LAB — COMPREHENSIVE METABOLIC PANEL
ALT: 16 U/L (ref 0–35)
AST: 18 U/L (ref 0–37)
Albumin: 4.6 g/dL (ref 3.5–5.2)
Alkaline Phosphatase: 49 U/L (ref 39–117)
BUN: 15 mg/dL (ref 6–23)
CHLORIDE: 99 meq/L (ref 96–112)
CO2: 29 meq/L (ref 19–32)
CREATININE: 1.01 mg/dL (ref 0.40–1.20)
Calcium: 10.1 mg/dL (ref 8.4–10.5)
GFR: 71.42 mL/min (ref >60.00)
Glucose, Bld: 90 mg/dL (ref 70–99)
Potassium: 4.3 mEq/L (ref 3.5–5.1)
SODIUM: 134 meq/L — AB (ref 135–145)
Total Bilirubin: 0.5 mg/dL (ref 0.2–1.2)
Total Protein: 7.4 g/dL (ref 6.0–8.3)

## 2016-12-09 LAB — LIPID PANEL
CHOLESTEROL: 231 mg/dL — AB (ref 0–200)
HDL: 71.5 mg/dL (ref >39.00)
LDL CALC: 140 mg/dL — AB (ref 0–99)
NonHDL: 159.12
Total CHOL/HDL Ratio: 3
Triglycerides: 94 mg/dL (ref 0.0–149.0)
VLDL: 18.8 mg/dL (ref 0.0–40.0)

## 2016-12-09 LAB — HEMOGLOBIN A1C: Hgb A1c MFr Bld: 6.3 % (ref 4.6–6.5)

## 2016-12-09 NOTE — Progress Notes (Signed)
Subjective:  Patient ID: Sarah Delgado, female    DOB: 11-27-1955  Age: 61 y.o. MRN: [id number]  CC: The primary encounter diagnosis was Essential hypertension, benign. Diagnoses of Hyperlipidemia LDL goal <100, Prediabetes, Class 1 obesity due to excess calories without serious comorbidity with body mass index (BMI) of 32.0 to 32.9 in adult, Tobacco abuse, and H/O: iritis were also pertinent to this visit.  HPI Sarah Delgado presents for follow up on CAD with history of lacunar infarct , hyperlipidemia managed with statin therapy, prediabetes, menopause managed with  premariin    Tobacco abuse:  Quit smoking several weeks ago. Feels generally well,  But has gained 9 lbs since last visit . Not exercising o na regular basis.  Had eye exam last month,  diagnosed with  Iritis involving the right eye,   Brother 15 has glaucoma.    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 aspirin EC 81 MG tablet Take 81 mg by mouth daily.    Marland Kitchen atorvastatin (LIPITOR) 10 MG tablet TAKE ONE (1) TABLET BY MOUTH EVERY OTHER DAY 30 tablet 5  . Biotin (PA BIOTIN) 1000 MCG tablet Take 1,000 mcg by mouth daily.    . Cholecalciferol (VITAMIN D-3) 1000 units CAPS Take 1 capsule by mouth daily.    . Coenzyme Q10 10 MG capsule Take 10 mg by mouth daily.    Marland Kitchen PREMARIN 0.3 MG tablet TAKE ONE (1) TABLET BY MOUTH EVERY DAY 30 tablet 5  . triamterene-hydrochlorothiazide (MAXZIDE-25) 37.5-25 MG tablet TAKE ONE (1) TABLET BY MOUTH EVERY DAY 90 tablet 1  . diazepam (VALIUM) 5 MG tablet Take 1 tablet (5 mg total) by mouth every 8 (eight) hours as needed (to be taken with meclizine). (Patient not taking: Reported on 04/28/2016) 20 tablet 0  . meclizine (ANTIVERT) 25 MG  tablet Take 1 tablet (25 mg total) by mouth 3 (three) times daily as needed for dizziness or nausea. (Patient not taking: Reported on 04/28/2016) 30 tablet 1  . Vitamin D, Ergocalciferol, (DRISDOL) 50000 UNITS CAPS capsule Take 1 capsule (50,000 Units total) by mouth every 7 (seven) days. (Patient not taking: Reported on 04/28/2016) 12 capsule 0   No facility-administered medications prior to visit.     Review of Systems;  Patient denies headache, fevers, malaise, unintentional weight loss, skin rash, eye pain, sinus congestion and sinus pain, sore throat, dysphagia,  hemoptysis , cough, dyspnea, wheezing, chest pain, palpitations, orthopnea, edema, abdominal pain, nausea, melena, diarrhea, constipation, flank pain, dysuria, hematuria, urinary  Frequency, nocturia, numbness, tingling, seizures,  Focal weakness, Loss of consciousness,  Tremor, insomnia, depression, anxiety, and suicidal ideation.      Objective:  BP 118/76 (BP Location: Left Arm, Patient Position: Sitting, Cuff Size: Normal)   Pulse 68   Temp 98.1 F (36.7 C) (Oral)   Resp 16   Ht 5\' 6"  (1.676 m)   Wt 198 lb (89.8 kg)   SpO2 97%   BMI 31.96 kg/m    BP Readings from Last 3 Encounters:  12/09/16 118/76  04/28/16 140/74  05/31/15 109/88    Wt Readings from Last 3 Encounters:  12/09/16 198 lb (89.8 kg)  04/28/16 189 lb 4 oz (85.8 kg)  05/31/15 185 lb (83.9  kg)    General appearance: alert, cooperative and appears stated age Ears: normal TM's and external ear canals both ears Throat: lips, mucosa, and tongue normal; teeth and gums normal Neck: no adenopathy, no carotid bruit, supple, symmetrical, trachea midline and thyroid not enlarged, symmetric, no tenderness/mass/nodules Back: symmetric, no curvature. ROM normal. No CVA tenderness. Lungs: clear to auscultation bilaterally Heart: regular rate and rhythm, S1, S2 normal, no murmur, click, rub or gallop Abdomen: soft, non-tender; bowel sounds normal; no masses,   no organomegaly Pulses: 2+ and symmetric Skin: Skin color, texture, turgor normal. No rashes or lesions Lymph nodes: Cervical, supraclavicular, and axillary nodes normal.  Lab Results  Component Value Date   HGBA1C 6.3 12/09/2016   HGBA1C 6.1 05/29/2016    Lab Results  Component Value Date   CREATININE 1.01 12/09/2016   CREATININE 1.00 05/29/2016   CREATININE 0.86 05/31/2015    Lab Results  Component Value Date   WBC 6.0 05/31/2015   HGB 13.9 05/31/2015   HCT 41.5 05/31/2015   PLT 304 05/31/2015   GLUCOSE 90 12/09/2016   CHOL 231 (H) 12/09/2016   TRIG 94.0 12/09/2016   HDL 71.50 12/09/2016   LDLCALC 140 (H) 12/09/2016   ALT 16 12/09/2016   AST 18 12/09/2016   NA 134 (L) 12/09/2016   K 4.3 12/09/2016   CL 99 12/09/2016   CREATININE 1.01 12/09/2016   BUN 15 12/09/2016   CO2 29 12/09/2016   TSH 1.059 05/31/2015   HGBA1C 6.3 12/09/2016    Mm Screening Breast Tomo Bilateral  Result Date: 05/20/2016 CLINICAL DATA:  Screening. EXAM: 2D DIGITAL SCREENING BILATERAL MAMMOGRAM WITH CAD AND ADJUNCT TOMO COMPARISON:  Previous exam(s). ACR Breast Density Category b: There are scattered areas of fibroglandular density. FINDINGS: There are no findings suspicious for malignancy. Images were processed with CAD. IMPRESSION: No mammographic evidence of malignancy. A result letter of this screening mammogram will be mailed directly to the patient. RECOMMENDATION: Screening mammogram in one year. (Code:SM-B-01Y) BI-RADS CATEGORY  1: Negative. Electronically Signed   By: Dorise Bullion III M.D   On: 05/20/2016 12:39    Assessment & Plan:   Problem List Items Addressed This Visit    Tobacco abuse    She has been abstinent for a month .encouragement given,       Prediabetes    Her a1c has risen slightly but her fasting glucoses are not diagnostic. Recommended low GI diet and regular participation in  exercise   Lab Results  Component Value Date   HGBA1C 6.3 12/09/2016          Relevant Orders   Hemoglobin A1c (Completed)   Obesity    I have addressed  BMI and recommended wt loss of 10% of body weigh over the next 6 months using a low glycemic index diet and regular exercise a minimum of 5 days per week.        Hyperlipidemia LDL goal <100    Unchanged from last year , using lipitor every other day. 10 yr risk of CAD now < 8%  Lab Results  Component Value Date   CHOL 231 (H) 12/09/2016   HDL 71.50 12/09/2016   LDLCALC 140 (H) 12/09/2016   TRIG 94.0 12/09/2016   CHOLHDL 3 12/09/2016         Relevant Orders   Lipid panel (Completed)   H/O: iritis    Cause unclear       Essential hypertension, benign - Primary    Well controlled  on current regimen. Renal function stable, no changes today.  Lab Results  Component Value Date   CREATININE 1.01 12/09/2016   Lab Results  Component Value Date   NA 134 (L) 12/09/2016   K 4.3 12/09/2016   CL 99 12/09/2016   CO2 29 12/09/2016         Relevant Orders   Comprehensive metabolic panel (Completed)      I have discontinued Sarah Delgado's Vitamin D (Ergocalciferol), meclizine, and diazepam. I am also having her maintain her Coenzyme Q10, Biotin, Vitamin D-3, aspirin EC, atorvastatin, PREMARIN, triamterene-hydrochlorothiazide, and finasteride.  Meds ordered this encounter  Medications  . finasteride (PROSCAR) 5 MG tablet    Sig: Take 5 mg by mouth daily.    Medications Discontinued During This Encounter  Medication Reason  . diazepam (VALIUM) 5 MG tablet Patient has not taken in last 30 days  . meclizine (ANTIVERT) 25 MG tablet Patient has not taken in last 30 days  . Vitamin D, Ergocalciferol, (DRISDOL) 50000 UNITS CAPS capsule Patient has not taken in last 30 days    Follow-up: Return in about 6 months (around 06/11/2017) for CPE no pap .   Crecencio Mc, MD

## 2016-12-09 NOTE — Patient Instructions (Addendum)
Congratulations on quitting smoking!!   You accomplished the most important health choice you could ever make.    We are checking your A1c today to monitor your risk of progressing to type 2 DM   Here are several low carb protein bars I use to keep my snacks low carb:   Power Crunch  KIND 5 g sugar  Quest  Atkins  Exercise is your key to reversing your trend towards diabetes,  Which is due to insulin resistance.    I'll see you in 6 months for your annual exam

## 2016-12-09 NOTE — Progress Notes (Signed)
Pre visit review using our clinic review tool, if applicable. No additional management support is needed unless otherwise documented below in the visit note. 

## 2016-12-11 DIAGNOSIS — Z8669 Personal history of other diseases of the nervous system and sense organs: Secondary | ICD-10-CM | POA: Insufficient documentation

## 2016-12-11 NOTE — Assessment & Plan Note (Signed)
Cause unclear

## 2016-12-11 NOTE — Assessment & Plan Note (Signed)
Well controlled on current regimen. Renal function stable, no changes today.  Lab Results  Component Value Date   CREATININE 1.01 12/09/2016   Lab Results  Component Value Date   NA 134 (L) 12/09/2016   K 4.3 12/09/2016   CL 99 12/09/2016   CO2 29 12/09/2016

## 2016-12-11 NOTE — Assessment & Plan Note (Signed)
I have addressed  BMI and recommended wt loss of 10% of body weigh over the next 6 months using a low glycemic index diet and regular exercise a minimum of 5 days per week.   

## 2016-12-11 NOTE — Assessment & Plan Note (Signed)
She has been abstinent for a month .encouragement given,

## 2016-12-11 NOTE — Assessment & Plan Note (Signed)
Her a1c has risen slightly but her fasting glucoses are not diagnostic. Recommended low GI diet and regular participation in  exercise   Lab Results  Component Value Date   HGBA1C 6.3 12/09/2016

## 2016-12-11 NOTE — Assessment & Plan Note (Addendum)
Unchanged from last year , using lipitor every other day. 10 yr risk of CAD now < 8%  Lab Results  Component Value Date   CHOL 231 (H) 12/09/2016   HDL 71.50 12/09/2016   LDLCALC 140 (H) 12/09/2016   TRIG 94.0 12/09/2016   CHOLHDL 3 12/09/2016

## 2016-12-18 ENCOUNTER — Other Ambulatory Visit: Payer: Self-pay | Admitting: Internal Medicine

## 2017-05-30 ENCOUNTER — Other Ambulatory Visit: Payer: Self-pay | Admitting: Internal Medicine

## 2017-06-05 ENCOUNTER — Other Ambulatory Visit: Payer: Self-pay | Admitting: Internal Medicine

## 2017-06-11 ENCOUNTER — Ambulatory Visit (INDEPENDENT_AMBULATORY_CARE_PROVIDER_SITE_OTHER): Payer: Managed Care, Other (non HMO) | Admitting: Internal Medicine

## 2017-06-11 ENCOUNTER — Encounter: Payer: Self-pay | Admitting: Internal Medicine

## 2017-06-11 VITALS — BP 122/78 | HR 70 | Temp 98.1°F | Resp 15 | Ht 66.5 in | Wt 179.8 lb

## 2017-06-11 DIAGNOSIS — I10 Essential (primary) hypertension: Secondary | ICD-10-CM

## 2017-06-11 DIAGNOSIS — H8110 Benign paroxysmal vertigo, unspecified ear: Secondary | ICD-10-CM | POA: Diagnosis not present

## 2017-06-11 DIAGNOSIS — E236 Other disorders of pituitary gland: Secondary | ICD-10-CM | POA: Diagnosis not present

## 2017-06-11 DIAGNOSIS — R7303 Prediabetes: Secondary | ICD-10-CM

## 2017-06-11 DIAGNOSIS — E785 Hyperlipidemia, unspecified: Secondary | ICD-10-CM | POA: Diagnosis not present

## 2017-06-11 DIAGNOSIS — Z8679 Personal history of other diseases of the circulatory system: Secondary | ICD-10-CM | POA: Diagnosis not present

## 2017-06-11 DIAGNOSIS — Z716 Tobacco abuse counseling: Secondary | ICD-10-CM

## 2017-06-11 DIAGNOSIS — E559 Vitamin D deficiency, unspecified: Secondary | ICD-10-CM | POA: Diagnosis not present

## 2017-06-11 DIAGNOSIS — E663 Overweight: Secondary | ICD-10-CM | POA: Diagnosis not present

## 2017-06-11 DIAGNOSIS — Z0000 Encounter for general adult medical examination without abnormal findings: Secondary | ICD-10-CM

## 2017-06-11 LAB — MICROALBUMIN / CREATININE URINE RATIO
Creatinine,U: 148.5 mg/dL
MICROALB/CREAT RATIO: 0.5 mg/g (ref 0.0–30.0)

## 2017-06-11 LAB — COMPREHENSIVE METABOLIC PANEL
ALT: 11 U/L (ref 0–35)
AST: 15 U/L (ref 0–37)
Albumin: 4.2 g/dL (ref 3.5–5.2)
Alkaline Phosphatase: 46 U/L (ref 39–117)
BUN: 14 mg/dL (ref 6–23)
CHLORIDE: 101 meq/L (ref 96–112)
CO2: 30 mEq/L (ref 19–32)
Calcium: 10.2 mg/dL (ref 8.4–10.5)
Creatinine, Ser: 0.98 mg/dL (ref 0.40–1.20)
GFR: 73.82 mL/min (ref >60.00)
GLUCOSE: 89 mg/dL (ref 70–99)
POTASSIUM: 4.6 meq/L (ref 3.5–5.1)
SODIUM: 137 meq/L (ref 135–145)
Total Bilirubin: 0.5 mg/dL (ref 0.2–1.2)
Total Protein: 7.2 g/dL (ref 6.0–8.3)

## 2017-06-11 LAB — LIPID PANEL
CHOL/HDL RATIO: 3
Cholesterol: 195 mg/dL (ref 0–200)
HDL: 58.9 mg/dL (ref >39.00)
LDL CALC: 119 mg/dL — AB (ref 0–99)
NONHDL: 136.06
Triglycerides: 87 mg/dL (ref 0.0–149.0)
VLDL: 17.4 mg/dL (ref 0.0–40.0)

## 2017-06-11 LAB — HEMOGLOBIN A1C: Hgb A1c MFr Bld: 5.9 % (ref 4.6–6.5)

## 2017-06-11 NOTE — Progress Notes (Signed)
Patient ID: ZISSEL BIEDERMAN, female    DOB: 02-10-1956  Age: 61 y.o. MRN: [id number]  The patient is here for her annual physical  examination and management of other chronic and acute problems. Last seen in May    S/p TAH for noncancer.  No PAPs neded  Mammogram due Colonoscopy   2011, normal [facility]   The risk factors are reflected in the social history.  The roster of all physicians providing medical care to patient - is listed in the Snapshot section of the chart.  Activities of daily living:  The patient is 100% independent in all ADLs: dressing, toileting, feeding as well as independent mobility  Home safety : The patient has smoke detectors in the home. They wear seatbelts.  There are no firearms at home. There is no violence in the home.   There is no risks for hepatitis, STDs or HIV. There is no   history of blood transfusion. They have no travel history to infectious disease endemic areas of the world.  The patient has seen their dentist in the last six month. They have seen their eye doctor in the last year. They deny any hearing difficulty and screen today is normal. .  She does  not  have excessive sun exposure. Discussed the need for sun protection: hats, long sleeves and use of sunscreen if there is significant sun exposure.   Diet: the importance of a healthy diet is discussed. She does have a healthy diet.  The benefits of regular aerobic exercise were discussed. She walks 4 times per week ,  20 minutes.   Depression screen: there are no signs or vegative symptoms of depression- irritability, change in appetite, anhedonia, sadness/tearfullness.  Cognitive assessment: the patient manages all their financial and personal affairs and is actively engaged. They could relate day,date,year and events; recalled 2/3 objects at 3 minutes; performed clock-face test normally.  The following portions of the patient's history were reviewed and updated as appropriate: allergies,  current medications, past family history, past medical history,  past surgical history, past social history  and problem list.  Visual acuity was not assessed per patient preference since she has regular follow up with her ophthalmologist. Hearing and body mass index were assessed and reviewed.   During the course of the visit the patient was educated and counseled about appropriate screening and preventive services including : fall prevention , diabetes screening, nutrition counseling, colorectal cancer screening, and recommended immunizations.    CC: The primary encounter diagnosis was Encounter for preventive health examination. Diagnoses of Prediabetes, Hyperlipidemia LDL goal <100, Essential hypertension, benign, Tobacco abuse counseling, Overweight (BMI 25.0-29.9), Benign paroxysmal positional vertigo, unspecified laterality, History of cerebrovascular disease, Empty sella ([place]), and Vitamin D deficiency were also pertinent to this visit.  Lab Results  Component Value Date   HGBA1C 5.9 06/11/2017    1) recent episode of vertigo.  Previous occurrence  Oct 2016   Workup revealed empty sella, lacunar infarct (by CT done during ER visit at that time) followed by ENT eval with MRI shoeing arachnoid herniation into sella, no schwannoma or posterior fossa lesion.  No recurrence until  A brief episode  a month ago while changing positions in bed .   Saw Neurology NP  For this   2) Strong FH of Alzheimers Dementia  With personal concern for memory loss: initial  Neurology  Evaluation in 2016.  SLUMS score 27/30.  Repeat eval no change. Smoking cessation advised, along with daily use of  Asa , Vitamin E 400 Ius and 1000 mcg Vitamin  B12 .  3) Overweight: Taking "Saxenda " prescribed by [facility] [place]  [facility].   Started it in July And had lost 18  lbs at October follow up  Weight has plateaued  twice ,  Once due to not eating enough, once due to not exercising enough   4)  Taking   Finasteride for alopecia prescribed by  (dermatology)   5) Hypertension: compliant with maxzide for hypertension   6) Hyperlipidemia :  Taking lipitor every other day  For years.  No FH of CAD .   7) Tobacco abuse;  She has started Smoking again socially averaging about  8 cigs/week     History Zira has a past medical history of Chicken pox, Hyperlipidemia, and Hypertension.   She has a past surgical history that includes Breast surgery (2000); Total abdominal hysterectomy w/ bilateral salpingoophorectomy (2005); Reduction mammaplasty (Bilateral); and Abdominal hysterectomy.   Her family history includes Arthritis in her mother; COPD in her sister; Cancer in her brother and maternal aunt; Diabetes in her brother, brother, and sister; Emphysema in her father; Heart disease in her maternal grandfather; Hypertension in her brother, brother, brother, brother, and mother; Kidney disease in her sister; Mental illness in her mother; Stroke in her brother.She reports that she has quit smoking. Her smoking use included cigarettes. She started smoking about 6 months ago. she has never used smokeless tobacco. She reports that she drinks alcohol. She reports that she does not use drugs.  Outpatient Medications Prior to Visit  Medication Sig Dispense Refill  . aspirin EC 81 MG tablet Take 81 mg by mouth daily.    Marland Kitchen atorvastatin (LIPITOR) 10 MG tablet TAKE ONE (1) TABLET BY MOUTH EVERY OTHER DAY 30 tablet 5  . Biotin (PA BIOTIN) 1000 MCG tablet Take 1,000 mcg by mouth daily.    . Cholecalciferol (VITAMIN D-3) 1000 units CAPS Take 1 capsule by mouth daily.    . Coenzyme Q10 10 MG capsule Take 10 mg by mouth daily.    . finasteride (PROSCAR) 5 MG tablet Take 5 mg by mouth daily.    Marland Kitchen PREMARIN 0.3 MG tablet TAKE ONE (1) TABLET BY MOUTH EVERY DAY 30 tablet 3  . triamterene-hydrochlorothiazide (MAXZIDE-25) 37.5-25 MG tablet TAKE ONE TABLET BY MOUTH EVERY DAY 90 tablet 1  . vitamin B-12 (CYANOCOBALAMIN) 500  MCG tablet Take 500 mcg daily by mouth.    . vitamin E (VITAMIN E) 400 UNIT capsule Take 400 Units daily by mouth.    Marland Kitchen NOVOFINE 32G X 6 MM MISC   0  . SAXENDA 18 MG/3ML SOPN   0   No facility-administered medications prior to visit.     Review of Systems   Patient denies headache, fevers, malaise, unintentional weight loss, skin rash, eye pain, sinus congestion and sinus pain, sore throat, dysphagia,  hemoptysis , cough, dyspnea, wheezing, chest pain, palpitations, orthopnea, edema, abdominal pain, nausea, melena, diarrhea, constipation, flank pain, dysuria, hematuria, urinary  Frequency, nocturia, numbness, tingling, seizures,  Focal weakness, Loss of consciousness,  Tremor, insomnia, depression, anxiety, and suicidal ideation.      Objective:  BP 122/78 (BP Location: Left Arm, Patient Position: Sitting, Cuff Size: Normal)   Pulse 70   Temp 98.1 F (36.7 C) (Oral)   Resp 15   Ht 5' 6.5" (1.689 m)   Wt 179 lb 12.8 oz (81.6 kg)   SpO2 100%   BMI 28.59 kg/m  Physical Exam   General appearance: alert, cooperative and appears stated age Ears: normal TM's and external ear canals both ears Throat: lips, mucosa, and tongue normal; teeth and gums normal Neck: no adenopathy, no carotid bruit, supple, symmetrical, trachea midline and thyroid not enlarged, symmetric, no tenderness/mass/nodules Back: symmetric, no curvature. ROM normal. No CVA tenderness. Lungs: clear to auscultation bilaterally Heart: regular rate and rhythm, S1, S2 normal, no murmur, click, rub or gallop Abdomen: soft, non-tender; bowel sounds normal; no masses,  no organomegaly Pulses: 2+ and symmetric Skin: Skin color, texture, turgor normal. No rashes or lesions Lymph nodes: Cervical, supraclavicular, and axillary nodes normal.    Assessment & Plan:   Problem List Items Addressed This Visit    Benign positional vertigo    Recent epidose occurred while in bed and resolved spontaneously.      Empty sella  ([place])    Incidental finding during ER evaluation with head CT in 2016 for vertigo .  MRI done by ENT in Jan 2017 showed arachnoid herniation into empty sella.  Patient has no history consistent with intracranial hypertension or pseudotumor cerebri.       Encounter for preventive health examination - Primary    Annual comprehensive preventive exam was done as well as an evaluation and management of chronic conditions .  During the course of the visit the patient was educated and counseled about appropriate screening and preventive services including :  diabetes screening, lipid analysis with projected  10 year  risk for CAD , nutrition counseling, breast, cervical and colorectal cancer screening, and recommended immunizations.  Printed recommendations for health maintenance screenings was given.      Essential hypertension, benign    With evidence of prior lacunar infarct by head CT done Oct 2016 during ER evaluation of vertigo. BP is well controlled on current regimen. Renal function stable, no changes today.  Lab Results  Component Value Date   CREATININE 0.98 06/11/2017   Lab Results  Component Value Date   NA 137 06/11/2017   K 4.6 06/11/2017   CL 101 06/11/2017   CO2 30 06/11/2017         Relevant Orders   Microalbumin / creatinine urine ratio (Completed)   History of cerebrovascular disease    Lacunar infarct noted on head CT in 2016 during ER evaluation of vertigo.       Hyperlipidemia LDL goal <100    improved compared to last year , using lipitor every other day.  She would like to suspend the statin for 3 months to see if it is no longer indicated given her weight loss.    Lab Results  Component Value Date   CHOL 195 06/11/2017   HDL 58.90 06/11/2017   LDLCALC 119 (H) 06/11/2017   TRIG 87.0 06/11/2017   CHOLHDL 3 06/11/2017         Relevant Orders   Lipid panel (Completed)   Lipid panel   Overweight (BMI 25.0-29.9)    She has lost 18 lbs with  Saxenda,presecribed by a [facility].  She is at risk for diabetes based on today's a1c   Lab Results  Component Value Date   HGBA1C 5.9 06/11/2017   Lab Results  Component Value Date   ALT 11 06/11/2017   AST 15 06/11/2017   ALKPHOS 46 06/11/2017   BILITOT 0.5 06/11/2017         Prediabetes    Her a1c has dropped from 6.3 to 5.9 and her  fasting glucoses remain nondiagnostic. Recommended low GI diet and regular participation in  exercise   Lab Results  Component Value Date   HGBA1C 5.9 06/11/2017         Relevant Orders   Hemoglobin A1c (Completed)   Comprehensive metabolic panel (Completed)   Tobacco abuse counseling    The patient was counseled on the dangers of tobacco use, and was advised to quit.  Reviewed strategies to maximize success, including removing cigarettes and smoking materials from environment.       Other Visit Diagnoses    Vitamin D deficiency       Relevant Orders   VITAMIN D 25 Hydroxy (Vit-D Deficiency, Fractures)      I am having Larry L. Sonnen maintain her Coenzyme Q10, Biotin, Vitamin D-3, aspirin EC, atorvastatin, finasteride, triamterene-hydrochlorothiazide, PREMARIN, SAXENDA, NOVOFINE, vitamin B-12, and vitamin E.  Meds ordered this encounter  Medications  . SAXENDA 18 MG/3ML SOPN    Refill:  0  . NOVOFINE 32G X 6 MM MISC    Refill:  0  . vitamin B-12 (CYANOCOBALAMIN) 500 MCG tablet    Sig: Take 500 mcg daily by mouth.  . vitamin E (VITAMIN E) 400 UNIT capsule    Sig: Take 400 Units daily by mouth.    There are no discontinued medications.  Follow-up: No Follow-up on file.   Crecencio Mc, MD

## 2017-06-11 NOTE — Patient Instructions (Addendum)
Stop the lipitor after today,  And repeat your lipoids in 3 months  Get your BP checked  At work once a week for 3 weeks and send me the readings      There are plenty of high protein low carb cookies,  But they're not called "cookies."  Look for them in the diet section  where the protein shakes are  Sold.   All of these have 5 g sugar or less : Power crunch Atkins bars KIND :thE  "low glycemic index"  variety QUEST : (taste better after being microwaved OUT OF THE WRAPPER)   Danton Clap now makes a frozen breakfast frittata  And a fritatt sandwhich that can be microwaved in 2 minutes and are bot 8 net carbs crust

## 2017-06-13 DIAGNOSIS — E236 Other disorders of pituitary gland: Secondary | ICD-10-CM | POA: Insufficient documentation

## 2017-06-13 DIAGNOSIS — H8110 Benign paroxysmal vertigo, unspecified ear: Secondary | ICD-10-CM | POA: Insufficient documentation

## 2017-06-13 DIAGNOSIS — Z8679 Personal history of other diseases of the circulatory system: Secondary | ICD-10-CM | POA: Insufficient documentation

## 2017-06-13 NOTE — Assessment & Plan Note (Signed)
Lacunar infarct noted on head CT in 2016 during ER evaluation of vertigo.

## 2017-06-13 NOTE — Assessment & Plan Note (Signed)
Annual comprehensive preventive exam was done as well as an evaluation and management of chronic conditions .  During the course of the visit the patient was educated and counseled about appropriate screening and preventive services including :  diabetes screening, lipid analysis with projected  10 year  risk for CAD , nutrition counseling, breast, cervical and colorectal cancer screening, and recommended immunizations.  Printed recommendations for health maintenance screenings was given 

## 2017-06-13 NOTE — Assessment & Plan Note (Addendum)
With evidence of prior lacunar infarct by head CT done Oct 2016 during ER evaluation of vertigo. BP is well controlled on current regimen. Renal function stable, no changes today.  Lab Results  Component Value Date   CREATININE 0.98 06/11/2017   Lab Results  Component Value Date   NA 137 06/11/2017   K 4.6 06/11/2017   CL 101 06/11/2017   CO2 30 06/11/2017

## 2017-06-13 NOTE — Assessment & Plan Note (Addendum)
Her a1c has dropped from 6.3 to 5.9 and her fasting glucoses remain nondiagnostic. Recommended low GI diet and regular participation in  exercise   Lab Results  Component Value Date   HGBA1C 5.9 06/11/2017

## 2017-06-13 NOTE — Assessment & Plan Note (Signed)
The patient was counseled on the dangers of tobacco use, and was advised to quit.  Reviewed strategies to maximize success, including removing cigarettes and smoking materials from environment. 

## 2017-06-13 NOTE — Assessment & Plan Note (Signed)
She has lost 18 lbs with Saxenda,presecribed by a [facility].  She is at risk for diabetes based on today's a1c   Lab Results  Component Value Date   HGBA1C 5.9 06/11/2017   Lab Results  Component Value Date   ALT 11 06/11/2017   AST 15 06/11/2017   ALKPHOS 46 06/11/2017   BILITOT 0.5 06/11/2017

## 2017-06-13 NOTE — Assessment & Plan Note (Signed)
Recent epidose occurred while in bed and resolved spontaneously.

## 2017-06-13 NOTE — Assessment & Plan Note (Addendum)
improved compared to last year , using lipitor every other day.  She would like to suspend the statin for 3 months to see if it is no longer indicated given her weight loss.    Lab Results  Component Value Date   CHOL 195 06/11/2017   HDL 58.90 06/11/2017   LDLCALC 119 (H) 06/11/2017   TRIG 87.0 06/11/2017   CHOLHDL 3 06/11/2017

## 2017-06-13 NOTE — Assessment & Plan Note (Addendum)
Incidental finding during ER evaluation with head CT in 2016 for vertigo .  MRI done by ENT in Jan 2017 showed arachnoid herniation into empty sella.  Patient has no history consistent with intracranial hypertension or pseudotumor cerebri.

## 2017-06-22 ENCOUNTER — Other Ambulatory Visit: Payer: Self-pay | Admitting: Internal Medicine

## 2017-06-22 DIAGNOSIS — Z1231 Encounter for screening mammogram for malignant neoplasm of breast: Secondary | ICD-10-CM

## 2017-08-01 ENCOUNTER — Other Ambulatory Visit: Payer: Self-pay | Admitting: Internal Medicine

## 2017-08-07 ENCOUNTER — Ambulatory Visit
Admission: RE | Admit: 2017-08-07 | Discharge: 2017-08-07 | Disposition: A | Discharge status: Home / Self Care | Payer: Managed Care, Other (non HMO) | Source: Ambulatory Visit | Attending: Internal Medicine | Admitting: Internal Medicine

## 2017-08-07 DIAGNOSIS — Z1231 Encounter for screening mammogram for malignant neoplasm of breast: Secondary | ICD-10-CM | POA: Diagnosis not present

## 2017-08-17 ENCOUNTER — Telehealth: Payer: Self-pay

## 2017-08-17 NOTE — Telephone Encounter (Signed)
Lm on vm that appointment was made with Dr. Marigene Ehlers on 08/20/2017 at 11:30am, Pt is suppose to call if she can not make this appt.

## 2017-08-17 NOTE — Telephone Encounter (Signed)
No I do not want to overbook while I am on the scooter unless absolutely necessary,  So If I do not have a n 11;30 or 4:30 in the next week can you see if Dr Aundra Dubin will see her ?

## 2017-08-17 NOTE — Telephone Encounter (Signed)
Please advise 

## 2017-08-17 NOTE — Telephone Encounter (Signed)
Copied from [place] [id number]. Topic: Appointment Scheduling - Scheduling Inquiry for Clinic >> Aug 17, 2017 10:41 AM Conception Chancy, NT wrote: Reason for CRM: patient states she would like to be seen due to a discharge she is having and she is concerned because she has had a total hysterectomy. Dr. Derrel Nip is full all through Feb. I offered someone else in the practice. Patient states she would like to see if she can be fit in or who would Dr. Derrel Nip recommend her see in the office besides her. Please advise and contact patient back.

## 2017-08-17 NOTE — Telephone Encounter (Signed)
Would you like for me to work this pt in on your schedule?

## 2017-08-20 ENCOUNTER — Other Ambulatory Visit (HOSPITAL_COMMUNITY)
Admission: RE | Admit: 2017-08-20 | Discharge: 2017-08-20 | Disposition: A | Discharge status: Home / Self Care | Payer: Managed Care, Other (non HMO) | Source: Ambulatory Visit | Attending: Internal Medicine | Admitting: Internal Medicine

## 2017-08-20 ENCOUNTER — Ambulatory Visit (INDEPENDENT_AMBULATORY_CARE_PROVIDER_SITE_OTHER): Payer: Managed Care, Other (non HMO) | Admitting: Internal Medicine

## 2017-08-20 ENCOUNTER — Encounter: Payer: Self-pay | Admitting: Internal Medicine

## 2017-08-20 VITALS — BP 110/84 | HR 70 | Temp 98.3°F | Resp 16 | Ht 66.5 in | Wt 180.0 lb

## 2017-08-20 DIAGNOSIS — N760 Acute vaginitis: Secondary | ICD-10-CM

## 2017-08-20 DIAGNOSIS — N898 Other specified noninflammatory disorders of vagina: Secondary | ICD-10-CM

## 2017-08-20 MED ORDER — FLUCONAZOLE 150 MG PO TABS: 150.0000 mg | ORAL_TABLET | Freq: Once | ORAL | 0 refills | Status: AC

## 2017-08-20 NOTE — Progress Notes (Signed)
Chief Complaint  Patient presents with  . Vaginal Discharge   Vaginal discharge new x 3 weeks yellow in color ?odor tried OTC monistat 1 day w/o relief. She is s/p hysterectomy due to "cervical issue" but denies h/o abnormal pap. She has not been sexually active in a while but has a significant other. She uses philosophy soap in the private area.     Review of Systems  Constitutional: Negative for weight loss.  Respiratory: Negative for shortness of breath.   Cardiovascular: Negative for chest pain.  Genitourinary:       +vaginal discharge    Past Medical History:  Diagnosis Date  . Chicken pox   . Hyperlipidemia   . Hypertension    Past Surgical History:  Procedure Laterality Date  . ABDOMINAL HYSTERECTOMY    . BREAST SURGERY  2000   reduction  . REDUCTION MAMMAPLASTY Bilateral    at least 15 years ago  . TOTAL ABDOMINAL HYSTERECTOMY W/ BILATERAL SALPINGOOPHORECTOMY  2005   Family History  Problem Relation Age of Onset  . Arthritis Mother   . Hypertension Mother   . Mental illness Mother   . Emphysema Father   . COPD Sister   . Diabetes Sister   . Kidney disease Sister   . Diabetes Brother   . Hypertension Brother   . Heart disease Maternal Grandfather   . Diabetes Brother   . Hypertension Brother   . Cancer Brother        prostate  . Hypertension Brother   . Stroke Brother   . Hypertension Brother   . Cancer Maternal Aunt        esophageal ca,  history of etoh and tobacco   . Breast cancer Neg Hx    Social History   Socioeconomic History  . Marital status: Divorced    Spouse name: Not on file  . Number of children: Not on file  . Years of education: 16 plus  . Highest education level: Not on file  Social Needs  . Financial resource strain: Not on file  . Food insecurity - worry: Not on file  . Food insecurity - inability: Not on file  . Transportation needs - medical: Not on file  . Transportation needs - non-medical: Not on file  Occupational  History  . Occupation: Science writer: [employer]  Tobacco Use  . Smoking status: Former Smoker    Types: Cigarettes    Start date: 11/18/2016  . Smokeless tobacco: Never Used  Substance and Sexual Activity  . Alcohol use: Yes  . Drug use: No  . Sexual activity: Yes    Partners: Male  Other Topics Concern  . Not on file  Social History Narrative   Educator, works in [employer]   Current Meds  Medication Sig  . aspirin EC 81 MG tablet Take 81 mg by mouth daily.  Marland Kitchen atorvastatin (LIPITOR) 10 MG tablet TAKE ONE TABLET BY MOUTH EVERY OTHER DAY  . Biotin (PA BIOTIN) 1000 MCG tablet Take 1,000 mcg by mouth daily.   . Cholecalciferol (VITAMIN D-3) 1000 units CAPS Take 1 capsule by mouth daily.  . Coenzyme Q10 10 MG capsule Take 10 mg by mouth daily.  . finasteride (PROSCAR) 5 MG tablet Take 5 mg by mouth daily.  Marland Kitchen NOVOFINE 32G X 6 MM MISC   . PREMARIN 0.3 MG tablet TAKE ONE (1) TABLET BY MOUTH EVERY DAY  . SAXENDA 18 MG/3ML SOPN   .  triamterene-hydrochlorothiazide (MAXZIDE-25) 37.5-25 MG tablet TAKE ONE TABLET BY MOUTH EVERY DAY  . vitamin B-12 (CYANOCOBALAMIN) 500 MCG tablet Take 500 mcg daily by mouth.  . vitamin E (VITAMIN E) 400 UNIT capsule Take 400 Units daily by mouth.   No Known Allergies Recent Results (from the past 2160 hour(s))  Hemoglobin A1c     Status: None   Collection Time: 06/11/17 10:31 AM  Result Value Ref Range   Hgb A1c MFr Bld 5.9 4.6 - 6.5 %    Comment: Glycemic Control Guidelines for People with Diabetes:Non Diabetic:  <6%Goal of Therapy: <7%Additional Action Suggested:  >8%   Lipid panel     Status: Abnormal   Collection Time: 06/11/17 10:31 AM  Result Value Ref Range   Cholesterol 195 0 - 200 mg/dL    Comment: ATP III Classification       Desirable:  < 200 mg/dL               Borderline High:  200 - 239 mg/dL          High:  > = 240 mg/dL   Triglycerides 87.0 0.0 - 149.0 mg/dL    Comment: Normal:  <150  mg/dLBorderline High:  150 - 199 mg/dL   HDL 58.90 >39.00 mg/dL   VLDL 17.4 0.0 - 40.0 mg/dL   LDL Cholesterol 119 (H) 0 - 99 mg/dL   Total CHOL/HDL Ratio 3     Comment:                Men          Women1/2 Average Risk     3.4          3.3Average Risk          5.0          4.42X Average Risk          9.6          7.13X Average Risk          15.0          11.0                       NonHDL 136.06     Comment: NOTE:  Non-HDL goal should be 30 mg/dL higher than patient's LDL goal (i.e. LDL goal of < 70 mg/dL, would have non-HDL goal of < 100 mg/dL)  Comprehensive metabolic panel     Status: None   Collection Time: 06/11/17 10:31 AM  Result Value Ref Range   Sodium 137 135 - 145 mEq/L   Potassium 4.6 3.5 - 5.1 mEq/L   Chloride 101 96 - 112 mEq/L   CO2 30 19 - 32 mEq/L   Glucose, Bld 89 70 - 99 mg/dL   BUN 14 6 - 23 mg/dL   Creatinine, Ser 0.98 0.40 - 1.20 mg/dL   Total Bilirubin 0.5 0.2 - 1.2 mg/dL   Alkaline Phosphatase 46 39 - 117 U/L   AST 15 0 - 37 U/L   ALT 11 0 - 35 U/L   Total Protein 7.2 6.0 - 8.3 g/dL   Albumin 4.2 3.5 - 5.2 g/dL   Calcium 10.2 8.4 - 10.5 mg/dL   GFR 73.82 >60.00 mL/min  Microalbumin / creatinine urine ratio     Status: None   Collection Time: 06/11/17 10:31 AM  Result Value Ref Range   Microalb, Ur <0.7 0.0 - 1.9 mg/dL   Creatinine,U 148.5 mg/dL   Microalb Creat  Ratio 0.5 0.0 - 30.0 mg/g   Objective  Body mass index is 28.62 kg/m. Wt Readings from Last 3 Encounters:  08/20/17 180 lb (81.6 kg)  06/11/17 179 lb 12.8 oz (81.6 kg)  12/09/16 198 lb (89.8 kg)   Temp Readings from Last 3 Encounters:  08/20/17 98.3 F (36.8 C) (Oral)  06/11/17 98.1 F (36.7 C) (Oral)  12/09/16 98.1 F (36.7 C) (Oral)   BP Readings from Last 3 Encounters:  08/20/17 110/84  06/11/17 122/78  12/09/16 118/76   Pulse Readings from Last 3 Encounters:  08/20/17 70  06/11/17 70  12/09/16 68   O2 sat room air 98%  Physical Exam  Constitutional: She is oriented to  person, place, and time and well-developed, well-nourished, and in no distress. Vital signs are normal.  HENT:  Head: Normocephalic and atraumatic.  Mouth/Throat: Oropharynx is clear and moist and mucous membranes are normal.  Eyes: Conjunctivae are normal. Pupils are equal, round, and reactive to light.  Cardiovascular: Normal rate, regular rhythm and normal heart sounds.  Subtle murmur heard on exam today   Pulmonary/Chest: Effort normal and breath sounds normal. She has no wheezes.  Genitourinary: Thin  odorless  white  yellow and vaginal discharge found.  Genitourinary Comments: Wet prep sample obtained today   Neurological: She is alert and oriented to person, place, and time. Gait normal. Gait normal.  Skin: Skin is warm, dry and intact.  Psychiatric: Mood, memory, affect and judgment normal.  Nursing note and vitals reviewed.   Assessment   1. Vaginal discharge  Plan  1. Empiric tx diflucan x 1  Sample today for GC/C, yeast, BV, trich F/u in 2-3 weeks prn   Of note subtle murmur heard on exam address at f/u with PCP and consider echo if necessary Provider: Dr. Olivia Mackie McLean-Scocuzza-Internal Medicine

## 2017-08-20 NOTE — Patient Instructions (Addendum)
Try Diflucan x 1 pill once  Follow up in 2-3 weeks if needed otherwise as scheduled.   Vaginitis Vaginitis is a condition in which the vaginal tissue swells and becomes red (inflamed). This condition is most often caused by a change in the normal balance of bacteria and yeast that live in the vagina. This change causes an overgrowth of certain bacteria or yeast, which causes the inflammation. There are different types of vaginitis, but the most common types are:  Bacterial vaginosis.  Yeast infection (candidiasis).  Trichomoniasis vaginitis. This is a sexually transmitted disease (STD).  Viral vaginitis.  Atrophic vaginitis.  Allergic vaginitis.  What are the causes? The cause of this condition depends on the type of vaginitis. It can be caused by:  Bacteria (bacterial vaginosis).  Yeast, which is a fungus (yeast infection).  A parasite (trichomoniasis vaginitis).  A virus (viral vaginitis).  Low hormone levels (atrophic vaginitis). Low hormone levels can occur during pregnancy, breastfeeding, or after menopause.  Irritants, such as bubble baths, scented tampons, and feminine sprays (allergic vaginitis).  Other factors can change the normal balance of the yeast and bacteria that live in the vagina. These include:  Antibiotic medicines.  Poor hygiene.  Diaphragms, vaginal sponges, spermicides, birth control pills, and intrauterine devices (IUD).  Sex.  Infection.  Uncontrolled diabetes.  A weakened defense (immune) system.  What increases the risk? This condition is more likely to develop in women who:  Smoke.  Use vaginal douches, scented tampons, or scented sanitary pads.  Wear tight-fitting pants.  Wear thong underwear.  Use oral birth control pills or an IUD.  Have sex without a condom.  Have multiple sex partners.  Have an STD.  Frequently use the spermicide nonoxynol-9.  Eat lots of foods high in sugar.  Have uncontrolled diabetes.  Have  low estrogen levels.  Have a weakened immune system from an immune disorder or medical treatment.  Are pregnant or breastfeeding.  What are the signs or symptoms? Symptoms vary depending on the cause of the vaginitis. Common symptoms include:  Abnormal vaginal discharge. ? The discharge is white, gray, or yellow with bacterial vaginosis. ? The discharge is thick, white, and cheesy with a yeast infection. ? The discharge is frothy and yellow or greenish with trichomoniasis.  A bad vaginal smell. The smell is fishy with bacterial vaginosis.  Vaginal itching, pain, or swelling.  Sex that is painful.  Pain or burning when urinating.  Sometimes there are no symptoms. How is this diagnosed? This condition is diagnosed based on your symptoms and medical history. A physical exam, including a pelvic exam, will also be done. You may also have other tests, including:  Tests to determine the pH level (acidity or alkalinity) of your vagina.  A whiff test, to assess the odor that results when a sample of your vaginal discharge is mixed with a potassium hydroxide solution.  Tests of vaginal fluid. A sample will be examined under a microscope.  How is this treated? Treatment varies depending on the type of vaginitis you have. Your treatment may include:  Antibiotic creams or pills to treat bacterial vaginosis and trichomoniasis.  Antifungal medicines, such as vaginal creams or suppositories, to treat a yeast infection.  Medicine to ease discomfort if you have viral vaginitis. Your sexual partner should also be treated.  Estrogen delivered in a cream, pill, suppository, or vaginal ring to treat atrophic vaginitis. If vaginal dryness occurs, lubricants and moisturizing creams may help. You may need to avoid scented  soaps, sprays, or douches.  Stopping use of a product that is causing allergic vaginitis. Then using a vaginal cream to treat the symptoms.  Follow these instructions at  home: Lifestyle  Keep your genital area clean and dry. Avoid soap, and only rinse the area with water.  Do not douche or use tampons until your health care provider says it is okay to do so. Use sanitary pads, if needed.  Do not have sex until your health care provider approves. When you can return to sex, practice safe sex and use condoms.  Wipe from front to back. This avoids the spread of bacteria from the rectum to the vagina. General instructions  Take over-the-counter and prescription medicines only as told by your health care provider.  If you were prescribed an antibiotic medicine, take or use it as told by your health care provider. Do not stop taking or using the antibiotic even if you start to feel better.  Keep all follow-up visits as told by your health care provider. This is important. How is this prevented?  Use mild, non-scented products. Do not use things that can irritate the vagina, such as fabric softeners. Avoid the following products if they are scented: ? Feminine sprays. ? Detergents. ? Tampons. ? Feminine hygiene products. ? Soaps or bubble baths.  Let air reach your genital area. ? Wear cotton underwear to reduce moisture buildup. ? Avoid wearing underwear while you sleep. ? Avoid wearing tight pants and underwear or nylons without a cotton panel. ? Avoid wearing thong underwear.  Take off any wet clothing, such as bathing suits, as soon as possible.  Practice safe sex and use condoms. Contact a health care provider if:  You have abdominal pain.  You have a fever.  You have symptoms that last for more than 2-3 days. Get help right away if:  You have a fever and your symptoms suddenly get worse. Summary  Vaginitis is a condition in which the vaginal tissue becomes inflamed.This condition is most often caused by a change in the normal balance of bacteria and yeast that live in the vagina.  Treatment varies depending on the type of vaginitis you  have.  Do not douche, use tampons , or have sex until your health care provider approves. When you can return to sex, practice safe sex and use condoms. This information is not intended to replace advice given to you by your health care provider. Make sure you discuss any questions you have with your health care provider. Document Released: 05/18/2007 Document Revised: 08/26/2016 Document Reviewed: 08/26/2016 Elsevier Interactive Patient Education  [employer].

## 2017-08-21 ENCOUNTER — Telehealth: Payer: Self-pay | Admitting: Internal Medicine

## 2017-08-21 NOTE — Telephone Encounter (Signed)
Please advise 

## 2017-08-21 NOTE — Telephone Encounter (Signed)
Copied from [id number]. Topic: Quick Communication - See Telephone Encounter >> Aug 21, 2017  9:24 AM Robina Ade, Leone Payor wrote: Peter Congo with [facility] called and would like to talk to Dr. Aundra Dubin or her CMA about patient labs. She said that they will not be able to run all the test. Please call her back at [phone]. CRM for notification. See Telephone encounter for: 08/21/17.

## 2017-08-21 NOTE — Telephone Encounter (Signed)
Please call Monday and see what is going on ?  Thanks tMS

## 2017-08-24 ENCOUNTER — Other Ambulatory Visit: Payer: Self-pay | Admitting: Internal Medicine

## 2017-08-24 DIAGNOSIS — A599 Trichomoniasis, unspecified: Secondary | ICD-10-CM

## 2017-08-24 LAB — CERVICOVAGINAL ANCILLARY ONLY: Wet Prep (BD Affirm): POSITIVE — AB

## 2017-08-24 MED ORDER — METRONIDAZOLE 500 MG PO TABS: 500.0000 mg | ORAL_TABLET | Freq: Two times a day (BID) | ORAL | 0 refills | Status: DC

## 2017-08-24 NOTE — Telephone Encounter (Signed)
I addressed this   Thanks tMS

## 2017-08-24 NOTE — Progress Notes (Signed)
Disc results with pt will tx trichomonas bid x 1 week  If still continues will need to check for GC/C as the test was not able to be run with wet prep probe   [place]

## 2017-08-24 NOTE — Telephone Encounter (Signed)
This is a cytology issue that deals with wet preps/PAPs. Lab doesn't handle PAPs/wet preps.

## 2017-10-23 ENCOUNTER — Encounter: Payer: Self-pay | Admitting: Internal Medicine

## 2017-10-26 ENCOUNTER — Other Ambulatory Visit: Payer: Self-pay | Admitting: Internal Medicine

## 2017-10-26 DIAGNOSIS — A599 Trichomoniasis, unspecified: Secondary | ICD-10-CM

## 2017-10-26 MED ORDER — METRONIDAZOLE 500 MG PO TABS: 500.0000 mg | ORAL_TABLET | Freq: Two times a day (BID) | ORAL | 0 refills | Status: DC

## 2017-11-10 ENCOUNTER — Ambulatory Visit: Payer: Self-pay | Admitting: Family Medicine

## 2017-11-10 VITALS — BP 114/59 | HR 74 | Temp 98.4°F | Resp 16 | Ht 67.0 in | Wt 174.0 lb

## 2017-11-10 DIAGNOSIS — Z008 Encounter for other general examination: Secondary | ICD-10-CM

## 2017-11-10 DIAGNOSIS — Z0189 Encounter for other specified special examinations: Principal | ICD-10-CM

## 2017-11-10 LAB — GLUCOSE, POCT (MANUAL RESULT ENTRY): POC Glucose: 102 mg/dl — AB (ref 70–99)

## 2017-11-10 NOTE — Progress Notes (Signed)
Subjective: Annual biometrics screening  Patient presents for her annual biometric screening. Patient denies any medical problems but reports being on triamterene-HCTZ, liraglutide, and atorvastatin.  Patient denies any symptoms or concerns today. PCP: Dr. Derrel Nip. Patient works for [employer] Patient denies any other issues or concerns.   Review of Systems Constitutional: Unremarkable.  HEENT: Unremarkable. Gastrointestinal: Unremarkable. Respiratory: Unremarkable.   Cardiovascular: Unremarkable.  ROS otherwise negative.   Objective  Physical Exam General: Awake, alert and oriented. No acute distress. Well developed, hydrated and nourished. Appears stated age.  HEENT: Supple neck without adenopathy. Sclera is non-icteric. The ear canal is clear without discharge. The tympanic membrane is normal in appearance with normal landmarks and cone of light. Nasal mucosa is pink and moist. Oral mucosa is pink and moist. The pharynx is normal in appearance without tonsillar swelling or exudates.  Skin: Skin in warm, dry and intact without rashes or lesions. Appropriate color for ethnicity. Cardiac: Heart rate and rhythm are normal. No murmurs, gallops, or rubs are auscultated.  Respiratory: The chest wall is symmetric and without deformity. No signs of respiratory distress. Lung sounds are clear in all lobes bilaterally without rales, ronchi, or wheezes.  Abdominal: Abdomen is soft, symmetric, and non-tender without distention. No masses, hepatomegaly, or splenomegaly are noted.  Neurological: The patient is awake, alert and oriented to person, place, and time with normal speech.  Memory is normal and thought processes intact. No gait abnormalities are appreciated.  Psychiatric: Appropriate mood and affect.   Assessment Annual biometrics screen  Plan  Lipid panel pending. Encouraged routine visits with primary care provider.  Nonfasting blood sugar 102 today.

## 2017-11-11 LAB — LIPID PANEL
CHOL/HDL RATIO: 3.6 ratio (ref 0.0–4.4)
CHOLESTEROL TOTAL: 225 mg/dL — AB (ref 100–199)
HDL: 62 mg/dL (ref >39)
LDL CALC: 144 mg/dL — AB (ref 0–99)
Triglycerides: 96 mg/dL (ref 0–149)
VLDL CHOLESTEROL CAL: 19 mg/dL (ref 5–40)

## 2017-11-11 NOTE — Progress Notes (Signed)
Dear Ms. Domingo Cocking, I wanted to let you know that your lipid panel came back.  Your total cholesterol is 225, normal values are between 100 and 199.  Your triglyceride level is 96, normal values are between 0 and 149.  Your VLDL cholesterol is 19, normal values are between 5 and 40. Your HDL cholesterol ("good cholesterol") is 62, normal values are above 39.  Your LDL cholesterol ("bad cholesterol") is 144, normal values are below 99.  Your cholesterol/HDL ratio is 3.6, normal values are between 0 and 4.4 for women or 0 and 5 from men.  The abnormal values increase your risk for cardiovascular disease.  I wanted you to be aware of these results so that you can discuss this with your primary care provider at your next regularly scheduled visit.

## 2017-11-17 ENCOUNTER — Ambulatory Visit: Payer: Self-pay

## 2017-11-18 ENCOUNTER — Ambulatory Visit: Payer: Self-pay | Admitting: Family Medicine

## 2017-11-18 VITALS — BP 113/79 | HR 69 | Resp 16

## 2017-11-18 DIAGNOSIS — M25551 Pain in right hip: Secondary | ICD-10-CM

## 2017-11-18 NOTE — Progress Notes (Signed)
Subjective: Right hip pain    GLENA PHARRIS is a 62 y.o. female who presents with right hip pain. Onset of the symptoms was 3 weeks ago.  Symptoms have not progressed at all.  Inciting event: none.  Patient describes pain as originating at her right hip laterally and radiating to the lateral aspect of her right knee.  No anterior or posterior hip pain.  Denies back or abdominal pain.  Denies mechanical symptoms.  Denies systemic symptoms such as fever, chills, malaise, fatigue, warmth to touch, erythema, or edema.  Denies weakness, numbness, or tingling.  Patient is able to walk without any difficulty or assistive device.  Denies history of malignancy, night sweats, or unexpected weight loss.  Denies night pain or constant pain.  The patient reports the hip pain radiates to her right lateral knee and endorses pain when standing up from a seated position only on occasion.  Denies pain with any other activity, including stair climbing or walking.  Patient has had no prior hip problems. Previous visits for this problem: none. Evaluation to date: none. Treatment to date: none.  Denies any history of osteoporosis, osteopenia, pathologic fractures, or corticosteroid use.  Denies risk factors for osteonecrosis.  Describes symptoms as mild and infrequent.   Review of Systems Pertinent items noted in HPI and remainder of comprehensive ROS otherwise negative.   Objective:    BP 113/79 (BP Location: Left Arm, Patient Position: Sitting, Cuff Size: Normal)   Pulse 69   Resp 16   SpO2 98%  Right hip: full painless range of motion, without tenderness.  Pain is reproduced when standing with knee flexed to 30 degrees.  Otherwise unable to reproduce pain.  Normal strength, sensation.  Left hip: full painless range of motion, without tenderness.  Normal strength normal sensation.  General appearance: alert, cooperative, appears stated age and no distress Back: symmetric, no curvature. ROM normal.  Abdomen: soft,  non-tender; bowel sounds normal; no masses,  no organomegaly Extremities: extremities normal, atraumatic, no cyanosis or edema Pulses: 2+ and symmetric Skin: Skin color, texture, turgor normal. No rashes or lesions Neurologic: Alert and oriented X 3, normal strength and tone. Normal symmetric reflexes. Normal coordination and gait   Assessment:   Right hip pain  Plan:   Natural history and expected course discussed. Questions answered. Discussed relative rest and avoidance of aggravating activity.  Discussed resuming low impact exercise following that. Home exercises/stretches discussed, including Dr. Denice Paradise exercise/stretching videos online. Recommended OTC Aleve for 1 week.  Discussed side/adverse effects, dosage, and education regarding how to take this.  Discussed not taking this regularly long term. RICE modalities. Patient says she is planning to stop taking aspirin, advised her to talk to her primary care provider regarding this.  Advised patient not take aspirin with NSAIDs. Advised patient to follow-up with her primary care provider if symptoms do not improve or worsen within the next 2-3 weeks. Discussed getting an xray and patient decided to wait and see if symptoms improve before imaging due to the mild/intermittent nature of the pain.  Red flag symptoms and indications to seek medical care discussed.

## 2017-11-26 ENCOUNTER — Ambulatory Visit: Payer: Self-pay | Admitting: Family Medicine

## 2017-11-26 VITALS — BP 115/62 | HR 74 | Temp 99.1°F | Resp 16

## 2017-11-26 DIAGNOSIS — J0110 Acute frontal sinusitis, unspecified: Secondary | ICD-10-CM

## 2017-11-26 MED ORDER — AMOXICILLIN-POT CLAVULANATE 875-125 MG PO TABS: 1.0000 | ORAL_TABLET | Freq: Two times a day (BID) | ORAL | 0 refills | Status: AC

## 2017-11-26 MED ORDER — BENZONATATE 100 MG PO CAPS: 100.0000 mg | ORAL_CAPSULE | Freq: Every evening | ORAL | 0 refills | Status: DC | PRN

## 2017-11-26 NOTE — Progress Notes (Signed)
Subjective: Facial pressure     Sarah Delgado is a 62 y.o. female who presents for evaluation of right-sided facial pressure, purulent nasal congestion, fatigue, nonproductive cough, headache, and sneezing for 4 days.  Patient reports significant pressure to the right side of her  face overlying her frontal sinus.  Patient denies any history of allergic rhinitis but has been outside for the last few days intermittently and is unsure if pollen is contributing to her symptoms.  Denies any sick contacts.  Patient reports the cough is keeping her up at night. Treatment to date: Attempted Flonase nasal spray once.  Denies rash, nausea, vomiting, diarrhea, shortness of breath, wheezing, chest or back pain, ear pain, sore throat, difficulty swallowing, confusion, body aches, fever, or chills. History of smoking, asthma, COPD: Negative. History of recurrent sinus and/or lung infections: Negative. Antibiotic use in the last 3 months: Denies.   Review of Systems Pertinent items noted in HPI and remainder of comprehensive ROS otherwise negative.     Objective:   Physical Exam General: Awake, alert, and oriented. No acute distress. Well developed, hydrated and nourished. Appears stated age. Nontoxic appearance.  HEENT:  PND noted.  No erythema to posterior oropharynx.  No edema or exudates of pharynx or tonsils. No erythema or bulging of TM.  Mild erythema/edema to nasal mucosa.  Moderate right frontal sinus tenderness.  Mild right maxillary sinus tenderness.  Remainder of sinuses nontender. Supple neck without adenopathy. Cardiac: Heart rate and rhythm are normal. No murmurs, gallops, or rubs are auscultated. S1 and S2 are heard and are of normal intensity.  Respiratory: No signs of respiratory distress. Lungs clear. No tachypnea. Able to speak in full sentences without dyspnea. Nonlabored respirations.  Skin: Skin is warm, dry and intact. Appropriate color for ethnicity. No cyanosis noted.     Assessment:    sinusitis   Plan:    Discussed the diagnosis and treatment of sinusitis. Suggested symptomatic OTC remedies. Nasal saline spray for congestion.   Augmentin prescribed due to severity of patient's facial pressure unilaterally.  Prescribed Tessalon Perles to use as needed at night for cough. Discussed side/adverse effects of all medications prescribed. Discussed red flag symptoms and circumstances with which to seek medical care.   New Prescriptions   AMOXICILLIN-CLAVULANATE (AUGMENTIN) 875-125 MG TABLET    Take 1 tablet by mouth 2 (two) times daily for 10 days.   BENZONATATE (TESSALON) 100 MG CAPSULE    Take 1 capsule (100 mg total) by mouth at bedtime as needed for cough.

## 2017-12-01 ENCOUNTER — Other Ambulatory Visit: Payer: Self-pay

## 2017-12-01 MED ORDER — ESTROGENS CONJUGATED 0.3 MG PO TABS: ORAL_TABLET | ORAL | 1 refills | Status: DC

## 2017-12-15 ENCOUNTER — Ambulatory Visit: Payer: Self-pay

## 2018-01-18 ENCOUNTER — Other Ambulatory Visit: Payer: Self-pay | Admitting: Internal Medicine

## 2018-02-20 ENCOUNTER — Other Ambulatory Visit: Payer: Self-pay | Admitting: Internal Medicine

## 2018-06-16 ENCOUNTER — Encounter: Payer: Self-pay | Admitting: Internal Medicine

## 2018-06-16 ENCOUNTER — Ambulatory Visit (INDEPENDENT_AMBULATORY_CARE_PROVIDER_SITE_OTHER): Payer: Managed Care, Other (non HMO) | Admitting: Internal Medicine

## 2018-06-16 VITALS — BP 100/68 | HR 64 | Temp 98.1°F | Resp 14 | Ht 67.0 in | Wt 177.0 lb

## 2018-06-16 DIAGNOSIS — Z1239 Encounter for other screening for malignant neoplasm of breast: Secondary | ICD-10-CM | POA: Diagnosis not present

## 2018-06-16 DIAGNOSIS — I10 Essential (primary) hypertension: Secondary | ICD-10-CM | POA: Diagnosis not present

## 2018-06-16 DIAGNOSIS — E785 Hyperlipidemia, unspecified: Secondary | ICD-10-CM | POA: Diagnosis not present

## 2018-06-16 DIAGNOSIS — E663 Overweight: Secondary | ICD-10-CM

## 2018-06-16 DIAGNOSIS — R419 Unspecified symptoms and signs involving cognitive functions and awareness: Secondary | ICD-10-CM

## 2018-06-16 DIAGNOSIS — Z23 Encounter for immunization: Secondary | ICD-10-CM

## 2018-06-16 DIAGNOSIS — Z0000 Encounter for general adult medical examination without abnormal findings: Secondary | ICD-10-CM

## 2018-06-16 LAB — LIPID PANEL
CHOLESTEROL: 212 mg/dL — AB (ref 0–200)
HDL: 73.8 mg/dL (ref >39.00)
LDL CALC: 119 mg/dL — AB (ref 0–99)
NONHDL: 138.37
Total CHOL/HDL Ratio: 3
Triglycerides: 96 mg/dL (ref 0.0–149.0)
VLDL: 19.2 mg/dL (ref 0.0–40.0)

## 2018-06-16 LAB — COMPREHENSIVE METABOLIC PANEL
ALT: 12 U/L (ref 0–35)
AST: 17 U/L (ref 0–37)
Albumin: 4.5 g/dL (ref 3.5–5.2)
Alkaline Phosphatase: 40 U/L (ref 39–117)
BUN: 20 mg/dL (ref 6–23)
CHLORIDE: 100 meq/L (ref 96–112)
CO2: 30 meq/L (ref 19–32)
CREATININE: 1.03 mg/dL (ref 0.40–1.20)
Calcium: 10.2 mg/dL (ref 8.4–10.5)
GFR: 69.48 mL/min (ref >60.00)
GLUCOSE: 82 mg/dL (ref 70–99)
POTASSIUM: 4.4 meq/L (ref 3.5–5.1)
SODIUM: 137 meq/L (ref 135–145)
Total Bilirubin: 0.7 mg/dL (ref 0.2–1.2)
Total Protein: 7.5 g/dL (ref 6.0–8.3)

## 2018-06-16 LAB — TSH: TSH: 0.87 u[IU]/mL (ref 0.35–4.50)

## 2018-06-16 MED ORDER — SAXENDA 18 MG/3ML ~~LOC~~ SOPN: 3.0000 mg | PEN_INJECTOR | Freq: Every day | SUBCUTANEOUS | 0 refills | Status: DC

## 2018-06-16 NOTE — Progress Notes (Signed)
Patient ID: Sarah Delgado, female    DOB: July 15, 1956  Age: 62 y.o. MRN: [id number]  The patient is here for annual preventive  examination and management of other chronic and acute problems, including hyperlipidemia, obesity and  essential hypertension.  Her last OV with me was  Nov 2018 , although she was treated for vaginitis in Jan 2019    The risk factors are reflected in the social history.  The roster of all physicians providing medical care to patient - is listed in the Snapshot section of the chart.  Activities of daily living:  The patient is 100% independent in all ADLs: dressing, toileting, feeding as well as independent mobility  Home safety : The patient has smoke detectors in the home. They wear seatbelts.  There are no firearms at home. There is no violence in the home.   There is no risks for hepatitis, STDs or HIV. There is no   history of blood transfusion. They have no travel history to infectious disease endemic areas of the world.  The patient has seen their dentist in the last six month. They have seen their eye doctor in the last year. They deny hearing difficulty with regard to whispered voices and some television programs.  They have deferred audiologic testing in the last year.  They do not  have excessive sun exposure. Discussed the need for sun protection: hats, long sleeves and use of sunscreen if there is significant sun exposure.   Diet: the importance of a healthy diet is discussed. They do have a healthy diet.  The benefits of regular aerobic exercise were discussed. Shedoes not exercise regularly .   Depression screen: there are no signs or vegative symptoms of depression- irritability, change in appetite, anhedonia, sadness/tearfullness.  The following portions of the patient's history were reviewed and updated as appropriate: allergies, current medications, past family history, past medical history,  past surgical history, past social history  and problem  list.  Visual acuity was not assessed per patient preference since she has regular follow up with her ophthalmologist. Hearing and body mass index were assessed and reviewed.   During the course of the visit the patient was educated and counseled about appropriate screening and preventive services including : fall prevention , diabetes screening, nutrition counseling, colorectal cancer screening, and recommended immunizations.    CC: The primary encounter diagnosis was Hyperlipidemia LDL goal <100. Diagnoses of Need for immunization against influenza, Encounter for preventive health examination, Essential hypertension, benign, Overweight (BMI 25.0-29.9), Breast cancer screening, and Cognitive complaints with normal neuropsychological exam were also pertinent to this visit.  1) Hyperlipidemia:  She has tolerated taking lipitor every other day  2) Hypertension: patient checks blood pressure twice weekly at home.  Readings have been for the most part <120/70 at rest . Patient is following a reduced salt diet most days and is taking medications as prescribed  3) Obesity:  She Has lost 21 lb by today's visit.  She states that her personal best was loss of 30 lbs  ,  Using Saxenda ,  Since last July .  Her goal is 160 lbs.     An additional 15 minutes of face to face time was spent with patient  in counselling about her diet and need for regular exercise.    History Sarah Delgado has a past medical history of Chicken pox, Hyperlipidemia, and Hypertension.   She has a past surgical history that includes Breast surgery (2000); Total abdominal hysterectomy w/ bilateral salpingoophorectomy (  2005); Abdominal hysterectomy; and Reduction mammaplasty (Bilateral).   Her family history includes Arthritis in her mother; COPD in her sister; Cancer in her brother and maternal aunt; Diabetes in her brother, brother, and sister; Emphysema in her father; Heart disease in her maternal grandfather; Hypertension in her  brother, brother, brother, brother, and mother; Kidney disease in her sister; Mental illness in her mother; Stroke in her brother.She reports that she has quit smoking. Her smoking use included cigarettes. She started smoking about 18 months ago. She has never used smokeless tobacco. She reports that she drinks alcohol. She reports that she does not use drugs.  Outpatient Medications Prior to Visit  Medication Sig Dispense Refill  . aspirin EC 81 MG tablet Take 81 mg by mouth daily.    Marland Kitchen atorvastatin (LIPITOR) 10 MG tablet TAKE ONE TABLET BY MOUTH EVERY OTHER DAY 30 tablet 5  . Biotin (PA BIOTIN) 1000 MCG tablet Take 1,000 mcg by mouth daily.     . Cholecalciferol (VITAMIN D-3) 1000 units CAPS Take 1 capsule by mouth daily.    . Coenzyme Q10 10 MG capsule Take 10 mg by mouth daily.    Marland Kitchen triamterene-hydrochlorothiazide (MAXZIDE-25) 37.5-25 MG tablet TAKE ONE TABLET BY MOUTH EVERY DAY 90 tablet 1  . vitamin B-12 (CYANOCOBALAMIN) 500 MCG tablet Take 500 mcg daily by mouth.    . vitamin E (VITAMIN E) 400 UNIT capsule Take 400 Units daily by mouth.    . estrogens, conjugated, (PREMARIN) 0.3 MG tablet TAKE 1 TABLET BY MOUTH EVERY DAY 90 tablet 1  . SAXENDA 18 MG/3ML SOPN   0  . benzonatate (TESSALON) 100 MG capsule Take 1 capsule (100 mg total) by mouth at bedtime as needed for cough. (Patient not taking: Reported on 06/16/2018) 20 capsule 0   No facility-administered medications prior to visit.     Review of Systems   Patient denies headache, fevers, malaise, unintentional weight loss, skin rash, eye pain, sinus congestion and sinus pain, sore throat, dysphagia,  hemoptysis , cough, dyspnea, wheezing, chest pain, palpitations, orthopnea, edema, abdominal pain, nausea, melena, diarrhea, constipation, flank pain, dysuria, hematuria, urinary  Frequency, nocturia, numbness, tingling, seizures,  Focal weakness, Loss of consciousness,  Tremor, insomnia, depression, anxiety, and suicidal ideation.       Objective:  BP 100/68 (BP Location: Left Arm, Patient Position: Sitting, Cuff Size: Normal)   Pulse 64   Temp 98.1 F (36.7 C) (Oral)   Resp 14   Ht 5\' 7"  (1.702 m)   Wt 177 lb (80.3 kg)   SpO2 98%   BMI 27.72 kg/m   Physical Exam  General appearance: alert, cooperative and appears stated age Head: Normocephalic, without obvious abnormality, atraumatic Eyes: conjunctivae/corneas clear. PERRL, EOM's intact. Fundi benign. Ears: normal TM's and external ear canals both ears Nose: Nares normal. Septum midline. Mucosa normal. No drainage or sinus tenderness. Throat: lips, mucosa, and tongue normal; teeth and gums normal Neck: no adenopathy, no carotid bruit, no JVD, supple, symmetrical, trachea midline and thyroid not enlarged, symmetric, no tenderness/mass/nodules Lungs: clear to auscultation bilaterally Breasts: normal appearance, no masses or tenderness Heart: regular rate and rhythm, S1, S2 normal, no murmur, click, rub or gallop Abdomen: soft, non-tender; bowel sounds normal; no masses,  no organomegaly Extremities: extremities normal, atraumatic, no cyanosis or edema Pulses: 2+ and symmetric Skin: Skin color, texture, turgor normal. No rashes or lesions Neurologic: Alert and oriented X 3, normal strength and tone. Normal symmetric reflexes. Normal coordination and gait.  Assessment & Plan:   Problem List Items Addressed This Visit    Cognitive complaints with normal neuropsychological exam    She has had Neurology evaluation (Sarah Delgado)following an MRI done by ENT for evaluation of recurrent vertigo.  She has a family history of AD and is concerned about slight loss of short term memory and word finding difficulty (reported by patient).  Exam was normal.  She was advsied to stop smoking,  Continue aspirin 80 1 mg, B12 1000 mcg daily and Vitamin E  400 Ius daily and  RTC one year.       Encounter for preventive health examination    Annual comprehensive preventive exam  was done as well as an evaluation and management of chronic conditions .  During the course of the visit the patient was educated and counseled about appropriate screening and preventive services including :  diabetes screening, lipid analysis with projected  10 year  risk for CAD , nutrition counseling, breast, cervical and colorectal cancer screening, and recommended immunizations.  Printed recommendations for health maintenance screenings was given      Essential hypertension, benign    With evidence of prior lacunar infarct by head CT done Oct 2016 during ER evaluation of vertigo. BP is borderline low on current regimen. Renal function stable. Discussed suspending maxzide and resuming hctz alone if subsequent home readings are > 120/70.   Lab Results  Component Value Date   CREATININE 1.03 06/16/2018   Lab Results  Component Value Date   NA 137 06/16/2018   K 4.4 06/16/2018   CL 100 06/16/2018   CO2 30 06/16/2018         Hyperlipidemia LDL goal <100 - Primary    Lipid improved compared to last year , using lipitor every other day, and HDL is increased significantly.contineu every other day Lipitor given evidence of cerebrovascular disease on prior CT  .    Lab Results  Component Value Date   CHOL 212 (H) 06/16/2018   HDL 73.80 06/16/2018   LDLCALC 119 (H) 06/16/2018   TRIG 96.0 06/16/2018   CHOLHDL 3 06/16/2018    Lab Results  Component Value Date   ALT 12 06/16/2018   AST 17 06/16/2018   ALKPHOS 40 06/16/2018   BILITOT 0.7 06/16/2018         Relevant Orders   Lipid panel (Completed)   Comprehensive metabolic panel (Completed)   TSH (Completed)   Overweight (BMI 25.0-29.9)    She has lost 21 lbs with Saxenda,presecribed by a [facility].  She is at risk for diabetes based on prior a1c  And is following a low GI diet . She has plateaued and continues to avoid regular exercise which was strongly encouraged,  Both to reach goal of 160 lbs and to  maintain Optimal weight.   Lab Results  Component Value Date   HGBA1C 5.9 06/11/2017   Lab Results  Component Value Date   ALT 12 06/16/2018   AST 17 06/16/2018   ALKPHOS 40 06/16/2018   BILITOT 0.7 06/16/2018          Other Visit Diagnoses    Need for immunization against influenza       Relevant Orders   Flu Vaccine QUAD 36+ mos IM (Completed)   Breast cancer screening       Relevant Orders   MM 3D SCREEN BREAST BILATERAL      I have discontinued Sarah Delgado's benzonatate. I have also changed her  SAXENDA. Additionally, I am having her maintain her Coenzyme Q10, Biotin, Vitamin D-3, aspirin EC, vitamin B-12, vitamin E, atorvastatin, and triamterene-hydrochlorothiazide.  Meds ordered this encounter  Medications  . SAXENDA 18 MG/3ML SOPN    Sig: Inject 3 mg into the skin daily.    Dispense:  5 pen    Refill:  0    Medications Discontinued During This Encounter  Medication Reason  . benzonatate (TESSALON) 100 MG capsule Error  . SAXENDA 18 MG/3ML SOPN Reorder    Follow-up: Return in about 6 months (around 12/15/2018).   Crecencio Mc, MD

## 2018-06-16 NOTE — Patient Instructions (Addendum)
You can Check BP every other day for one week  after stopping medication a few days prior   If your readings are mostly > 120/70,  We should resume just hctz at 25 mg  You can use as little premarin as needed to keep  your symptoms tolerable  Colonoscopy is not due until 2022  START EXERCISING!   DON'T LET ANOTHER YEAR GO BY WITHOUT STARTING!!  Your still need your tetanus-diptheria-pertussis vaccine (TDaP) , so return for this (asearly as next week ) but you can get it for less $$$ at a local pharmacy with the script I have provided you.   Health Maintenance for Postmenopausal Women Menopause is a normal process in which your reproductive ability comes to an end. This process happens gradually over a span of months to years, usually between the ages of 107 and 23. Menopause is complete when you have missed 12 consecutive menstrual periods. It is important to talk with your health care provider about some of the most common conditions that affect postmenopausal women, such as heart disease, cancer, and bone loss (osteoporosis). Adopting a healthy lifestyle and getting preventive care can help to promote your health and wellness. Those actions can also lower your chances of developing some of these common conditions. What should I know about menopause? During menopause, you may experience a number of symptoms, such as:  Moderate-to-severe hot flashes.  Night sweats.  Decrease in sex drive.  Mood swings.  Headaches.  Tiredness.  Irritability.  Memory problems.  Insomnia.  Choosing to treat or not to treat menopausal changes is an individual decision that you make with your health care provider. What should I know about hormone replacement therapy and supplements? Hormone therapy products are effective for treating symptoms that are associated with menopause, such as hot flashes and night sweats. Hormone replacement carries certain risks, especially as you become older. If you are  thinking about using estrogen or estrogen with progestin treatments, discuss the benefits and risks with your health care provider. What should I know about heart disease and stroke? Heart disease, heart attack, and stroke become more likely as you age. This may be due, in part, to the hormonal changes that your body experiences during menopause. These can affect how your body processes dietary fats, triglycerides, and cholesterol. Heart attack and stroke are both medical emergencies. There are many things that you can do to help prevent heart disease and stroke:  Have your blood pressure checked at least every 1-2 years. High blood pressure causes heart disease and increases the risk of stroke.  If you are 44-6 years old, ask your health care provider if you should take aspirin to prevent a heart attack or a stroke.  Do not use any tobacco products, including cigarettes, chewing tobacco, or electronic cigarettes. If you need help quitting, ask your health care provider.  It is important to eat a healthy diet and maintain a healthy weight. ? Be sure to include plenty of vegetables, fruits, low-fat dairy products, and lean protein. ? Avoid eating foods that are high in solid fats, added sugars, or salt (sodium).  Get regular exercise. This is one of the most important things that you can do for your health. ? Try to exercise for at least 150 minutes each week. The type of exercise that you do should increase your heart rate and make you sweat. This is known as moderate-intensity exercise. ? Try to do strengthening exercises at least twice each week. Do these  in addition to the moderate-intensity exercise.  Know your numbers.Ask your health care provider to check your cholesterol and your blood glucose. Continue to have your blood tested as directed by your health care provider.  What should I know about cancer screening? There are several types of cancer. Take the following steps to reduce  your risk and to catch any cancer development as early as possible. Breast Cancer  Practice breast self-awareness. ? This means understanding how your breasts normally appear and feel. ? It also means doing regular breast self-exams. Let your health care provider know about any changes, no matter how small.  If you are 49 or older, have a clinician do a breast exam (clinical breast exam or CBE) every year. Depending on your age, family history, and medical history, it may be recommended that you also have a yearly breast X-ray (mammogram).  If you have a family history of breast cancer, talk with your health care provider about genetic screening.  If you are at high risk for breast cancer, talk with your health care provider about having an MRI and a mammogram every year.  Breast cancer (BRCA) gene test is recommended for women who have family members with BRCA-related cancers. Results of the assessment will determine the need for genetic counseling and BRCA1 and for BRCA2 testing. BRCA-related cancers include these types: ? Breast. This occurs in males or females. ? Ovarian. ? Tubal. This may also be called fallopian tube cancer. ? Cancer of the abdominal or pelvic lining (peritoneal cancer). ? Prostate. ? Pancreatic.  Cervical, Uterine, and Ovarian Cancer Your health care provider may recommend that you be screened regularly for cancer of the pelvic organs. These include your ovaries, uterus, and vagina. This screening involves a pelvic exam, which includes checking for microscopic changes to the surface of your cervix (Pap test).  For women ages 21-65, health care providers may recommend a pelvic exam and a Pap test every three years. For women ages 16-65, they may recommend the Pap test and pelvic exam, combined with testing for human papilloma virus (HPV), every five years. Some types of HPV increase your risk of cervical cancer. Testing for HPV may also be done on women of any age who  have unclear Pap test results.  Other health care providers may not recommend any screening for nonpregnant women who are considered low risk for pelvic cancer and have no symptoms. Ask your health care provider if a screening pelvic exam is right for you.  If you have had past treatment for cervical cancer or a condition that could lead to cancer, you need Pap tests and screening for cancer for at least 20 years after your treatment. If Pap tests have been discontinued for you, your risk factors (such as having a new sexual partner) need to be reassessed to determine if you should start having screenings again. Some women have medical problems that increase the chance of getting cervical cancer. In these cases, your health care provider may recommend that you have screening and Pap tests more often.  If you have a family history of uterine cancer or ovarian cancer, talk with your health care provider about genetic screening.  If you have vaginal bleeding after reaching menopause, tell your health care provider.  There are currently no reliable tests available to screen for ovarian cancer.  Lung Cancer Lung cancer screening is recommended for adults 76-63 years old who are at high risk for lung cancer because of a history of smoking.  A yearly low-dose CT scan of the lungs is recommended if you:  Currently smoke.  Have a history of at least 30 pack-years of smoking and you currently smoke or have quit within the past 15 years. A pack-year is smoking an average of one pack of cigarettes per day for one year.  Yearly screening should:  Continue until it has been 15 years since you quit.  Stop if you develop a health problem that would prevent you from having lung cancer treatment.  Colorectal Cancer  This type of cancer can be detected and can often be prevented.  Routine colorectal cancer screening usually begins at age 55 and continues through age 38.  If you have risk factors for colon  cancer, your health care provider may recommend that you be screened at an earlier age.  If you have a family history of colorectal cancer, talk with your health care provider about genetic screening.  Your health care provider may also recommend using home test kits to check for hidden blood in your stool.  A small camera at the end of a tube can be used to examine your colon directly (sigmoidoscopy or colonoscopy). This is done to check for the earliest forms of colorectal cancer.  Direct examination of the colon should be repeated every 5-10 years until age 12. However, if early forms of precancerous polyps or small growths are found or if you have a family history or genetic risk for colorectal cancer, you may need to be screened more often.  Skin Cancer  Check your skin from head to toe regularly.  Monitor any moles. Be sure to tell your health care provider: ? About any new moles or changes in moles, especially if there is a change in a mole's shape or color. ? If you have a mole that is larger than the size of a pencil eraser.  If any of your family members has a history of skin cancer, especially at a young age, talk with your health care provider about genetic screening.  Always use sunscreen. Apply sunscreen liberally and repeatedly throughout the day.  Whenever you are outside, protect yourself by wearing long sleeves, pants, a wide-brimmed hat, and sunglasses.  What should I know about osteoporosis? Osteoporosis is a condition in which bone destruction happens more quickly than new bone creation. After menopause, you may be at an increased risk for osteoporosis. To help prevent osteoporosis or the bone fractures that can happen because of osteoporosis, the following is recommended:  If you are 62-56 years old, get at least 1,000 mg of calcium and at least 600 mg of vitamin D per day.  If you are older than age 42 but younger than age 61, get at least 1,200 mg of calcium and  at least 600 mg of vitamin D per day.  If you are older than age 11, get at least 1,200 mg of calcium and at least 800 mg of vitamin D per day.  Smoking and excessive alcohol intake increase the risk of osteoporosis. Eat foods that are rich in calcium and vitamin D, and do weight-bearing exercises several times each week as directed by your health care provider. What should I know about how menopause affects my mental health? Depression may occur at any age, but it is more common as you become older. Common symptoms of depression include:  Low or sad mood.  Changes in sleep patterns.  Changes in appetite or eating patterns.  Feeling an overall lack of motivation  or enjoyment of activities that you previously enjoyed.  Frequent crying spells.  Talk with your health care provider if you think that you are experiencing depression. What should I know about immunizations? It is important that you get and maintain your immunizations. These include:  Tetanus, diphtheria, and pertussis (Tdap) booster vaccine.  Influenza every year before the flu season begins.  Pneumonia vaccine.  Shingles vaccine.  Your health care provider may also recommend other immunizations. This information is not intended to replace advice given to you by your health care provider. Make sure you discuss any questions you have with your health care provider. Document Released: 09/12/2005 Document Revised: 02/08/2016 Document Reviewed: 04/24/2015 Elsevier Interactive Patient Education  2018 [employer].

## 2018-06-17 ENCOUNTER — Other Ambulatory Visit: Payer: Self-pay

## 2018-06-17 ENCOUNTER — Other Ambulatory Visit: Payer: Self-pay | Admitting: Internal Medicine

## 2018-06-18 ENCOUNTER — Encounter: Payer: Self-pay | Admitting: Internal Medicine

## 2018-06-18 DIAGNOSIS — R4189 Other symptoms and signs involving cognitive functions and awareness: Secondary | ICD-10-CM | POA: Insufficient documentation

## 2018-06-18 DIAGNOSIS — R419 Unspecified symptoms and signs involving cognitive functions and awareness: Secondary | ICD-10-CM | POA: Insufficient documentation

## 2018-06-18 NOTE — Assessment & Plan Note (Addendum)
She has lost 21 lbs with Saxenda,presecribed by a [facility].  She is at risk for diabetes based on prior a1c  And is following a low GI diet . She has plateaued and continues to avoid regular exercise which was strongly encouraged,  Both to reach goal of 160 lbs and to maintain Optimal weight.   Lab Results  Component Value Date   HGBA1C 5.9 06/11/2017   Lab Results  Component Value Date   ALT 12 06/16/2018   AST 17 06/16/2018   ALKPHOS 40 06/16/2018   BILITOT 0.7 06/16/2018

## 2018-06-18 NOTE — Assessment & Plan Note (Addendum)
With evidence of prior lacunar infarct by head CT done Oct 2016 during ER evaluation of vertigo. BP is borderline low on current regimen. Renal function stable. Discussed suspending maxzide and resuming hctz alone if subsequent home readings are > 120/70.   Lab Results  Component Value Date   CREATININE 1.03 06/16/2018   Lab Results  Component Value Date   NA 137 06/16/2018   K 4.4 06/16/2018   CL 100 06/16/2018   CO2 30 06/16/2018

## 2018-06-18 NOTE — Assessment & Plan Note (Addendum)
Annual comprehensive preventive exam was done as well as an evaluation and management of chronic conditions .  During the course of the visit the patient was educated and counseled about appropriate screening and preventive services including :  diabetes screening, lipid analysis with projected  10 year  risk for CAD , nutrition counseling, breast, cervical and colorectal cancer screening, and recommended immunizations.  Printed recommendations for health maintenance screenings was given 

## 2018-06-18 NOTE — Assessment & Plan Note (Addendum)
Lipid improved compared to last year , using lipitor every other day, and HDL is increased significantly.contineu every other day Lipitor given evidence of cerebrovascular disease on prior CT  .    Lab Results  Component Value Date   CHOL 212 (H) 06/16/2018   HDL 73.80 06/16/2018   LDLCALC 119 (H) 06/16/2018   TRIG 96.0 06/16/2018   CHOLHDL 3 06/16/2018    Lab Results  Component Value Date   ALT 12 06/16/2018   AST 17 06/16/2018   ALKPHOS 40 06/16/2018   BILITOT 0.7 06/16/2018

## 2018-06-18 NOTE — Assessment & Plan Note (Addendum)
She has had Neurology evaluation ([facility])following an MRI done by ENT for evaluation of recurrent vertigo.  She has a family history of AD and is concerned about slight loss of short term memory and word finding difficulty (reported by patient).  Exam was normal.  She was advsied to stop smoking,  Continue aspirin 80 1 mg, B12 1000 mcg daily and Vitamin E  400 Ius daily and  RTC one year.

## 2018-06-19 ENCOUNTER — Telehealth: Payer: Self-pay | Admitting: Internal Medicine

## 2018-06-23 ENCOUNTER — Ambulatory Visit: Payer: Self-pay

## 2018-08-17 ENCOUNTER — Other Ambulatory Visit: Payer: Self-pay | Admitting: Internal Medicine

## 2018-08-24 ENCOUNTER — Telehealth: Payer: Self-pay

## 2018-08-24 NOTE — Telephone Encounter (Signed)
PA for Saxenda has been submitted on covermymeds.  

## 2018-08-30 DIAGNOSIS — E663 Overweight: Secondary | ICD-10-CM

## 2018-08-30 NOTE — Telephone Encounter (Signed)
PA has been approved from 08/24/2018 through 08/25/2019.

## 2018-09-01 ENCOUNTER — Encounter: Payer: Self-pay | Admitting: Internal Medicine

## 2018-09-01 ENCOUNTER — Ambulatory Visit (INDEPENDENT_AMBULATORY_CARE_PROVIDER_SITE_OTHER): Payer: Managed Care, Other (non HMO)

## 2018-09-01 ENCOUNTER — Ambulatory Visit (INDEPENDENT_AMBULATORY_CARE_PROVIDER_SITE_OTHER): Payer: Managed Care, Other (non HMO) | Admitting: Internal Medicine

## 2018-09-01 ENCOUNTER — Other Ambulatory Visit (HOSPITAL_COMMUNITY)
Admission: RE | Admit: 2018-09-01 | Discharge: 2018-09-01 | Disposition: A | Discharge status: Home / Self Care | Payer: Managed Care, Other (non HMO) | Source: Ambulatory Visit | Attending: Internal Medicine | Admitting: Internal Medicine

## 2018-09-01 VITALS — BP 118/72 | HR 66 | Temp 98.3°F | Ht 67.0 in | Wt 181.8 lb

## 2018-09-01 DIAGNOSIS — R011 Cardiac murmur, unspecified: Secondary | ICD-10-CM

## 2018-09-01 DIAGNOSIS — E559 Vitamin D deficiency, unspecified: Secondary | ICD-10-CM

## 2018-09-01 DIAGNOSIS — R42 Dizziness and giddiness: Secondary | ICD-10-CM | POA: Diagnosis not present

## 2018-09-01 DIAGNOSIS — I10 Essential (primary) hypertension: Secondary | ICD-10-CM

## 2018-09-01 DIAGNOSIS — M25511 Pain in right shoulder: Secondary | ICD-10-CM

## 2018-09-01 DIAGNOSIS — M79621 Pain in right upper arm: Secondary | ICD-10-CM

## 2018-09-01 DIAGNOSIS — A599 Trichomoniasis, unspecified: Secondary | ICD-10-CM | POA: Insufficient documentation

## 2018-09-01 DIAGNOSIS — R350 Frequency of micturition: Secondary | ICD-10-CM

## 2018-09-01 MED ORDER — TRIAMTERENE-HCTZ 37.5-25 MG PO TABS: 0.5000 | ORAL_TABLET | Freq: Every day | ORAL | 3 refills | Status: DC

## 2018-09-01 NOTE — Progress Notes (Signed)
Pre visit review using our clinic review tool, if applicable. No additional management support is needed unless otherwise documented below in the visit note. 

## 2018-09-01 NOTE — Patient Instructions (Signed)
Cut triamterene-hctz in 1/2 pill and take daily  Increase water intake   Dizziness Dizziness is a common problem. It is a feeling of unsteadiness or light-headedness. You may feel like you are about to faint. Dizziness can lead to injury if you stumble or fall. Anyone can become dizzy, but dizziness is more common in older adults. This condition can be caused by a number of things, including medicines, dehydration, or illness. Follow these instructions at home: Eating and drinking  Drink enough fluid to keep your urine clear or pale yellow. This helps to keep you from becoming dehydrated. Try to drink more clear fluids, such as water.  Do not drink alcohol.  Limit your caffeine intake if told to do so by your health care provider. Check ingredients and nutrition facts to see if a food or beverage contains caffeine.  Limit your salt (sodium) intake if told to do so by your health care provider. Check ingredients and nutrition facts to see if a food or beverage contains sodium. Activity  Avoid making quick movements. ? Rise slowly from chairs and steady yourself until you feel okay. ? In the morning, first sit up on the side of the bed. When you feel okay, stand slowly while you hold onto something until you know that your balance is fine.  If you need to stand in one place for a long time, move your legs often. Tighten and relax the muscles in your legs while you are standing.  Do not drive or use heavy machinery if you feel dizzy.  Avoid bending down if you feel dizzy. Place items in your home so that they are easy for you to reach without leaning over. Lifestyle  Do not use any products that contain nicotine or tobacco, such as cigarettes and e-cigarettes. If you need help quitting, ask your health care provider.  Try to reduce your stress level by using methods such as yoga or meditation. Talk with your health care provider if you need help to manage your stress. General  instructions  Watch your dizziness for any changes.  Take over-the-counter and prescription medicines only as told by your health care provider. Talk with your health care provider if you think that your dizziness is caused by a medicine that you are taking.  Tell a friend or a family member that you are feeling dizzy. If he or she notices any changes in your behavior, have this person call your health care provider.  Keep all follow-up visits as told by your health care provider. This is important. Contact a health care provider if:  Your dizziness does not go away.  Your dizziness or light-headedness gets worse.  You feel nauseous.  You have reduced hearing.  You have new symptoms.  You are unsteady on your feet or you feel like the room is spinning. Get help right away if:  You vomit or have diarrhea and are unable to eat or drink anything.  You have problems talking, walking, swallowing, or using your arms, hands, or legs.  You feel generally weak.  You are not thinking clearly or you have trouble forming sentences. It may take a friend or family member to notice this.  You have chest pain, abdominal pain, shortness of breath, or sweating.  Your vision changes.  You have any bleeding.  You have a severe headache.  You have neck pain or a stiff neck.  You have a fever. These symptoms may represent a serious problem that is an emergency.  Do not wait to see if the symptoms will go away. Get medical help right away. Call your local emergency services (911 in the [place] Do not drive yourself to the hospital. Summary  Dizziness is a feeling of unsteadiness or light-headedness. This condition can be caused by a number of things, including medicines, dehydration, or illness.  Anyone can become dizzy, but dizziness is more common in older adults.  Drink enough fluid to keep your urine clear or pale yellow. Do not drink alcohol.  Avoid making quick movements if you feel  dizzy. Monitor your dizziness for any changes. This information is not intended to replace advice given to you by your health care provider. Make sure you discuss any questions you have with your health care provider. Document Released: 01/14/2001 Document Revised: 08/23/2016 Document Reviewed: 08/23/2016 Elsevier Interactive Patient Education  2019 [employer].

## 2018-09-02 ENCOUNTER — Telehealth: Payer: Self-pay

## 2018-09-02 ENCOUNTER — Ambulatory Visit: Payer: Self-pay | Admitting: Internal Medicine

## 2018-09-02 ENCOUNTER — Encounter: Payer: Self-pay | Admitting: Internal Medicine

## 2018-09-02 DIAGNOSIS — R42 Dizziness and giddiness: Secondary | ICD-10-CM | POA: Insufficient documentation

## 2018-09-02 LAB — CBC WITH DIFFERENTIAL/PLATELET
Basophils Absolute: 0 10*3/uL (ref 0.0–0.1)
Basophils Relative: 0.2 % (ref 0.0–3.0)
Eosinophils Absolute: 0.3 10*3/uL (ref 0.0–0.7)
Eosinophils Relative: 3.5 % (ref 0.0–5.0)
HCT: 42.8 % (ref 36.0–46.0)
Hemoglobin: 14.2 g/dL (ref 12.0–15.0)
LYMPHS PCT: 41.8 % (ref 12.0–46.0)
Lymphs Abs: 3.1 10*3/uL (ref 0.7–4.0)
MCHC: 33.2 g/dL (ref 30.0–36.0)
MCV: 91.6 fl (ref 78.0–100.0)
Monocytes Absolute: 0.8 10*3/uL (ref 0.1–1.0)
Monocytes Relative: 11 % (ref 3.0–12.0)
Neutro Abs: 3.3 10*3/uL (ref 1.4–7.7)
Neutrophils Relative %: 43.5 % (ref 43.0–77.0)
Platelets: 342 10*3/uL (ref 150.0–400.0)
RBC: 4.68 Mil/uL (ref 3.87–5.11)
RDW: 13.6 % (ref 11.5–15.5)
WBC: 7.5 10*3/uL (ref 4.0–10.5)

## 2018-09-02 LAB — URINALYSIS, ROUTINE W REFLEX MICROSCOPIC
BILIRUBIN URINE: NEGATIVE
GLUCOSE, UA: NEGATIVE
Hgb urine dipstick: NEGATIVE
Ketones, ur: NEGATIVE
LEUKOCYTES UA: NEGATIVE
Nitrite: NEGATIVE
Protein, ur: NEGATIVE
SPECIFIC GRAVITY, URINE: 1.013 (ref 1.001–1.03)
pH: <=5 (ref 5.0–8.0)

## 2018-09-02 LAB — VITAMIN D 25 HYDROXY (VIT D DEFICIENCY, FRACTURES): VITD: 49.19 ng/mL (ref 30.00–100.00)

## 2018-09-02 NOTE — Telephone Encounter (Signed)
See note   [place]

## 2018-09-02 NOTE — Progress Notes (Signed)
Chief Complaint  Patient presents with  . Arm Pain    right upper arm   . Dizziness   F/u  1. HTN BP low normal today c/o dizziness intermittently lasting 5 seconds or less room is not spinning, no n/v/ha.  Orthostatics today lying 134/78 HR 76, sitting 122/88 HR 68, standing 126/88 HR 73  She is concerned due to co worker dropped dead at age of 78, several deaths in her family recently and another coworker died recently.  Dizziness if provoked by turning her head or getting up Dizziness if random since last visit with PCP. She has not tried cutting her maxzide 37.5-25 in 1/2 as prev instructed by PCP  2. C/o right upper ext pain was doing heavy lifting around Xmas  3. Urinary frequency  4. HLD of note she is not taking lipitor qhs (every day) 5. C/o hair loss esp after sew in weave style she is she using hair dye but will stop tension styles for now and possibly try Rogaine as rec by dermatology in the past    Of note PHq9 score 1 and GAD 7 2 today    Review of Systems  Constitutional: Negative for weight loss.  HENT: Negative for hearing loss.   Eyes: Negative for blurred vision.  Respiratory: Negative for shortness of breath.   Cardiovascular: Negative for chest pain and palpitations.  Gastrointestinal: Negative for nausea and vomiting.  Genitourinary: Positive for frequency.  Musculoskeletal: Negative for falls.  Skin: Negative for rash.       +hair loss    Neurological: Positive for dizziness. Negative for headaches.  Psychiatric/Behavioral: Negative for depression. The patient is not nervous/anxious.    Past Medical History:  Diagnosis Date  . Chicken pox   . Hyperlipidemia   . Hypertension    Past Surgical History:  Procedure Laterality Date  . ABDOMINAL HYSTERECTOMY    . BREAST SURGERY  2000   reduction  . REDUCTION MAMMAPLASTY Bilateral    at least 15 years ago  . TOTAL ABDOMINAL HYSTERECTOMY W/ BILATERAL SALPINGOOPHORECTOMY  2005   Family History  Problem  Relation Age of Onset  . Arthritis Mother   . Hypertension Mother   . Mental illness Mother   . Emphysema Father   . COPD Sister   . Diabetes Sister   . Kidney disease Sister   . Diabetes Brother   . Hypertension Brother   . Heart disease Maternal Grandfather   . Diabetes Brother   . Hypertension Brother   . Cancer Brother        prostate  . Hypertension Brother   . Stroke Brother   . Hypertension Brother   . Cancer Maternal Aunt        esophageal ca,  history of etoh and tobacco   . Breast cancer Neg Hx    Social History   Socioeconomic History  . Marital status: Divorced    Spouse name: Not on file  . Number of children: Not on file  . Years of education: 16 plus  . Highest education level: Not on file  Occupational History  . Occupation: Science writer: [employer]  . Financial resource strain: Not on file  . Food insecurity:    Worry: Not on file    Inability: Not on file  . Transportation needs:    Medical: Not on file    Non-medical: Not on file  Tobacco Use  . Smoking status: Former Smoker  Types: Cigarettes    Start date: 11/18/2016  . Smokeless tobacco: Never Used  Substance and Sexual Activity  . Alcohol use: Yes  . Drug use: No  . Sexual activity: Yes    Partners: Male  Lifestyle  . Physical activity:    Days per week: Not on file    Minutes per session: Not on file  . Stress: Not on file  Relationships  . Social connections:    Talks on phone: Not on file    Gets together: Not on file    Attends religious service: Not on file    Active member of club or organization: Not on file    Attends meetings of clubs or organizations: Not on file    Relationship status: Not on file  . Intimate partner violence:    Fear of current or ex partner: Not on file    Emotionally abused: Not on file    Physically abused: Not on file    Forced sexual activity: Not on file  Other Topics Concern  . Not on file  Social  History Narrative   Educator, works in [employer]   Current Meds  Medication Sig  . aspirin EC 81 MG tablet Take 81 mg by mouth daily.  Marland Kitchen atorvastatin (LIPITOR) 10 MG tablet TAKE ONE TABLET EVERY OTHER DAY.  Marland Kitchen Biotin (PA BIOTIN) 1000 MCG tablet Take 1,000 mcg by mouth daily.   . Cholecalciferol (VITAMIN D-3) 1000 units CAPS Take 1 capsule by mouth daily.  . Coenzyme Q10 10 MG capsule Take 10 mg by mouth daily.  Marland Kitchen estrogens, conjugated, (PREMARIN) 0.3 MG tablet TAKE 1 TABLET DAILY.  Marland Kitchen SAXENDA 18 MG/3ML SOPN Inject 3 mg into the skin daily.  Marland Kitchen triamterene-hydrochlorothiazide (MAXZIDE-25) 37.5-25 MG tablet Take 0.5 tablets by mouth daily.  . vitamin B-12 (CYANOCOBALAMIN) 500 MCG tablet Take 500 mcg daily by mouth.  . vitamin E (VITAMIN E) 400 UNIT capsule Take 400 Units daily by mouth.  . [DISCONTINUED] triamterene-hydrochlorothiazide (MAXZIDE-25) 37.5-25 MG tablet TAKE ONE TABLET BY MOUTH DAILY   No Known Allergies Recent Results (from the past 2160 hour(s))  Lipid panel     Status: Abnormal   Collection Time: 06/16/18  8:51 AM  Result Value Ref Range   Cholesterol 212 (H) 0 - 200 mg/dL    Comment: ATP III Classification       Desirable:  < 200 mg/dL               Borderline High:  200 - 239 mg/dL          High:  > = 240 mg/dL   Triglycerides 96.0 0.0 - 149.0 mg/dL    Comment: Normal:  <150 mg/dLBorderline High:  150 - 199 mg/dL   HDL 73.80 >39.00 mg/dL   VLDL 19.2 0.0 - 40.0 mg/dL   LDL Cholesterol 119 (H) 0 - 99 mg/dL   Total CHOL/HDL Ratio 3     Comment:                Men          Women1/2 Average Risk     3.4          3.3Average Risk          5.0          4.42X Average Risk          9.6          7.13X Average Risk  15.0          11.0                       NonHDL 138.37     Comment: NOTE:  Non-HDL goal should be 30 mg/dL higher than patient's LDL goal (i.e. LDL goal of < 70 mg/dL, would have non-HDL goal of < 100 mg/dL)  Comprehensive metabolic panel      Status: None   Collection Time: 06/16/18  8:51 AM  Result Value Ref Range   Sodium 137 135 - 145 mEq/L   Potassium 4.4 3.5 - 5.1 mEq/L   Chloride 100 96 - 112 mEq/L   CO2 30 19 - 32 mEq/L   Glucose, Bld 82 70 - 99 mg/dL   BUN 20 6 - 23 mg/dL   Creatinine, Ser 1.03 0.40 - 1.20 mg/dL   Total Bilirubin 0.7 0.2 - 1.2 mg/dL   Alkaline Phosphatase 40 39 - 117 U/L   AST 17 0 - 37 U/L   ALT 12 0 - 35 U/L   Total Protein 7.5 6.0 - 8.3 g/dL   Albumin 4.5 3.5 - 5.2 g/dL   Calcium 10.2 8.4 - 10.5 mg/dL   GFR 69.48 >60.00 mL/min  TSH     Status: None   Collection Time: 06/16/18  8:51 AM  Result Value Ref Range   TSH 0.87 0.35 - 4.50 uIU/mL  Urinalysis, Routine w reflex microscopic     Status: None   Collection Time: 09/01/18  4:18 PM  Result Value Ref Range   Color, Urine YELLOW YELLOW   APPearance CLEAR CLEAR   Specific Gravity, Urine 1.013 1.001 - 1.03   pH < OR = 5.0 5.0 - 8.0   Glucose, UA NEGATIVE NEGATIVE   Bilirubin Urine NEGATIVE NEGATIVE   Ketones, ur NEGATIVE NEGATIVE   Hgb urine dipstick NEGATIVE NEGATIVE   Protein, ur NEGATIVE NEGATIVE   Nitrite NEGATIVE NEGATIVE   Leukocytes, UA NEGATIVE NEGATIVE   Objective  Body mass index is 28.47 kg/m. Wt Readings from Last 3 Encounters:  09/01/18 181 lb 12.8 oz (82.5 kg)  06/16/18 177 lb (80.3 kg)  11/10/17 174 lb (78.9 kg)   Temp Readings from Last 3 Encounters:  09/01/18 98.3 F (36.8 C) (Oral)  06/16/18 98.1 F (36.7 C) (Oral)  11/26/17 99.1 F (37.3 C)   BP Readings from Last 3 Encounters:  09/01/18 118/72  06/16/18 100/68  11/26/17 115/62   Pulse Readings from Last 3 Encounters:  09/01/18 66  06/16/18 64  11/26/17 74    Physical Exam Vitals signs and nursing note reviewed.  Constitutional:      Appearance: Normal appearance. She is well-developed and well-groomed.  HENT:     Head: Normocephalic and atraumatic.     Nose: Nose normal.     Mouth/Throat:     Mouth: Mucous membranes are moist.     Pharynx:  Oropharynx is clear.  Eyes:     Conjunctiva/sclera: Conjunctivae normal.     Pupils: Pupils are equal, round, and reactive to light.  Cardiovascular:     Rate and Rhythm: Normal rate and regular rhythm.     Heart sounds: Murmur present.     Comments: Soft murmur on exam  Neg leg edema b/l  Skin:    General: Skin is warm and dry.     Comments: +alopecia ? 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  Neurological:     General: No focal deficit present.     Mental Status: She is alert and oriented to person, place, and time. Mental status is at baseline.     Gait: Gait normal.  Psychiatric:        Attention and Perception: Attention and perception normal.        Mood and Affect: Mood and affect normal.        Speech: Speech normal.        Behavior: Behavior normal. Behavior is cooperative.        Thought Content: Thought content normal.        Cognition and Memory: Cognition and memory normal.        Judgment: Judgment normal.     Assessment   1. HTN with low normal readings  2. Dizziness could be related to hypotension less likely vertigo 3. Right upper arm pain likely MSK r/o shoulder arthritis as cause RUE pain  4. Urinary freq and h/o trichomonas  5. HLD  6. Alopecia  7. HM 8. Dizziness/HLD/cardiac murmur  Plan  1.  Cut maxzide 37.5-25 in 1/2  2. Orthostatics neg see #1  Check CBC r/o anemia  3. Xray right shoulder  4. UA today also check urine due to h/o trich 5. rec take cholesterol medication qhs not doing currently  6. Consider rogaine as before less heat/traction styles/processing  7.  Flu shot utd  Tdap overdue consider at f/u with PCP  Consider shingrix  mammo rescheduled  Colonoscopy due 2021  Pap possibly due as well in future if h/o abnormal in the past s/p hysterectomy  DEXA consider age 65  HCV neg   Check vitamin D  Reviewed labs CMET, TSH, lipid 06/16/18 with PCP    8.  Check echo further w/u  Refer cards cards risk assessment consider stress test as well  Provider: Dr. Olivia Mackie McLean-Scocuzza-Internal Medicine

## 2018-09-02 NOTE — Telephone Encounter (Signed)
Mary called back. Per Judson Roch ok to have [facility] discard the specimen that she has to reject (no need to return to the office) and informed her that another specimen will be sent.

## 2018-09-02 NOTE — Telephone Encounter (Signed)
Pt urine still in office and will be resent for testing.

## 2018-09-02 NOTE — Telephone Encounter (Signed)
Copied from [place] [phone]. Topic: General - Inquiry >> Sep 02, 2018 10:35 AM Rayann Heman wrote: Reason for CRM: Stanton Kidney calling from [facility] called and stated that she will have to reject the urine specimen because it was filled over line. She would like to know if we have any additional urine to use. Please advise.

## 2018-09-02 NOTE — Telephone Encounter (Signed)
This pt saw Dr. Aundra Dubin yesterday.

## 2018-09-03 LAB — URINE CYTOLOGY ANCILLARY ONLY: Trichomonas: NEGATIVE

## 2018-09-13 ENCOUNTER — Ambulatory Visit
Admission: RE | Admit: 2018-09-13 | Discharge: 2018-09-13 | Disposition: A | Discharge status: Home / Self Care | Payer: Managed Care, Other (non HMO) | Source: Ambulatory Visit | Attending: Internal Medicine | Admitting: Internal Medicine

## 2018-09-13 DIAGNOSIS — Z1239 Encounter for other screening for malignant neoplasm of breast: Secondary | ICD-10-CM

## 2018-09-14 ENCOUNTER — Other Ambulatory Visit: Payer: Self-pay

## 2018-09-14 MED ORDER — SAXENDA 18 MG/3ML ~~LOC~~ SOPN: 3.0000 mg | PEN_INJECTOR | Freq: Every day | SUBCUTANEOUS | 0 refills | Status: DC

## 2018-09-16 ENCOUNTER — Encounter: Payer: Self-pay | Admitting: Internal Medicine

## 2018-09-17 NOTE — Telephone Encounter (Signed)
Cardiology has called pt to schedule. They are waiting on her to rtc. Also sent to [place] for ECHO.  Thanks, [employer]

## 2018-09-28 ENCOUNTER — Ambulatory Visit
Admission: RE | Admit: 2018-09-28 | Discharge: 2018-09-28 | Disposition: A | Discharge status: Home / Self Care | Payer: Managed Care, Other (non HMO) | Source: Ambulatory Visit | Attending: Internal Medicine | Admitting: Internal Medicine

## 2018-09-28 DIAGNOSIS — R42 Dizziness and giddiness: Secondary | ICD-10-CM | POA: Diagnosis not present

## 2018-09-28 DIAGNOSIS — E785 Hyperlipidemia, unspecified: Secondary | ICD-10-CM | POA: Diagnosis not present

## 2018-09-28 DIAGNOSIS — R011 Cardiac murmur, unspecified: Secondary | ICD-10-CM | POA: Diagnosis present

## 2018-09-28 DIAGNOSIS — I10 Essential (primary) hypertension: Secondary | ICD-10-CM | POA: Insufficient documentation

## 2018-09-28 NOTE — Progress Notes (Signed)
*  PRELIMINARY RESULTS* Echocardiogram 2D Echocardiogram has been performed.  Sarah Delgado 09/28/2018, 11:54 AM

## 2018-12-02 ENCOUNTER — Ambulatory Visit: Payer: Self-pay | Admitting: Internal Medicine

## 2018-12-03 ENCOUNTER — Other Ambulatory Visit: Payer: Self-pay | Admitting: Internal Medicine

## 2018-12-17 ENCOUNTER — Ambulatory Visit: Payer: Self-pay | Admitting: Internal Medicine

## 2018-12-22 ENCOUNTER — Ambulatory Visit (INDEPENDENT_AMBULATORY_CARE_PROVIDER_SITE_OTHER): Payer: Managed Care, Other (non HMO) | Admitting: Internal Medicine

## 2018-12-22 ENCOUNTER — Other Ambulatory Visit: Payer: Self-pay

## 2018-12-22 ENCOUNTER — Encounter: Payer: Self-pay | Admitting: Internal Medicine

## 2018-12-22 DIAGNOSIS — R7303 Prediabetes: Secondary | ICD-10-CM | POA: Diagnosis not present

## 2018-12-22 DIAGNOSIS — Z716 Tobacco abuse counseling: Secondary | ICD-10-CM | POA: Diagnosis not present

## 2018-12-22 DIAGNOSIS — E785 Hyperlipidemia, unspecified: Secondary | ICD-10-CM | POA: Diagnosis not present

## 2018-12-22 DIAGNOSIS — E663 Overweight: Secondary | ICD-10-CM

## 2018-12-22 NOTE — Progress Notes (Signed)
Virtual Visit via [email or web address]  This visit type was conducted due to national recommendations for restrictions regarding the COVID-19 pandemic (e.g. social distancing).  This format is felt to be most appropriate for this patient at this time.  All issues noted in this document were discussed and addressed.  No physical exam was performed (except for noted visual exam findings with Video Visits).   I connected with@ on 12/22/18 at  1:30 PM EDT by a video enabled telemedicine application or telephone and verified that I am speaking with the correct person using two identifiers. Location patient: home Location provider: work or home office Persons participating in the virtual visit: patient, provider  I discussed the limitations, risks, security and privacy concerns of performing an evaluation and management service by telephone and the availability of in person appointments. I also discussed with the patient that there may be a patient responsible charge related to this service. The patient expressed understanding and agreed to proceed.  I  Reason for visit: 6 month follow up.   HPI:  The patient has no signs or symptoms of COVID 19 infection (fever, cough, sore throat  or shortness of breath beyond what is typical for patient).  Patient denies contact with other persons with the above mentioned symptoms or with anyone confirmed to have COVID 61   63 yr old female with prediabetes, overweight managed with Saxenda since May 2018. Achieved a weight loss of 22 lbs,  Current weight is 176,  Walking daily most days for exercise. 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  Patient is following a reduce salt diet most days and is taking her  medications as prescribed  Mammogram normal Feb 2020 Colonoscopy normal 2011 at [facility]   Still smoking. Spent 3 minutes discussing risk of continued tobacco  abuse, including but not limited to CAD, PAD, hypertension, and CA.  she is not interested in pharmacotherapy at this time.  ROS: See pertinent positives and negatives per HPI.  Past Medical History:  Diagnosis Date  . Chicken pox   . Hyperlipidemia   . Hypertension     Past Surgical History:  Procedure Laterality Date  . ABDOMINAL HYSTERECTOMY    . BREAST SURGERY  2000   reduction  . REDUCTION MAMMAPLASTY Bilateral    at least 15 years ago  . TOTAL ABDOMINAL HYSTERECTOMY W/ BILATERAL SALPINGOOPHORECTOMY  2005    Family History  Problem Relation Age of Onset  . Arthritis Mother   . Hypertension Mother   . Mental illness Mother   . Emphysema Father   . COPD Sister   . Diabetes Sister   . Kidney disease Sister   . Diabetes Brother   . Hypertension Brother   . Heart disease Maternal Grandfather   . Diabetes Brother   . Hypertension Brother   . Cancer Brother        prostate  . Hypertension Brother   . Stroke Brother   . Hypertension Brother   . Cancer Maternal Aunt        esophageal ca,  history of etoh and tobacco   . Breast cancer Neg Hx     SOCIAL HX: working from home 4 days /week,  In office 1/week .    Current Outpatient Medications:  .  aspirin EC 81 MG tablet, Take 81 mg by mouth daily., Disp: , Rfl:  .  atorvastatin (LIPITOR) 10 MG tablet,  TAKE ONE TABLET EVERY OTHER DAY., Disp: 45 tablet, Rfl: 1 .  Cholecalciferol (VITAMIN D-3) 1000 units CAPS, Take 1 capsule by mouth daily., Disp: , Rfl:  .  Coenzyme Q10 10 MG capsule, Take 10 mg by mouth daily., Disp: , Rfl:  .  estrogens, conjugated, (PREMARIN) 0.3 MG tablet, TAKE 1 TABLET DAILY., Disp: 90 tablet, Rfl: 1 .  SAXENDA 18 MG/3ML SOPN, Inject 3 mg into the skin daily., Disp: 15 mL, Rfl: 0 .  triamterene-hydrochlorothiazide (MAXZIDE-25) 37.5-25 MG tablet, Take 0.5 tablets by mouth daily., Disp: 45 tablet, Rfl: 3 .  vitamin B-12 (CYANOCOBALAMIN) 500 MCG tablet, Take 500 mcg daily by mouth., Disp: , Rfl:  .   vitamin E (VITAMIN E) 400 UNIT capsule, Take 400 Units daily by mouth., Disp: , Rfl:  .  Biotin (PA BIOTIN) 1000 MCG tablet, Take 1,000 mcg by mouth daily. , Disp: , Rfl:   EXAM:  VITALS per patient if applicable:  GENERAL: alert, oriented, appears well and in no acute distress  HEENT: atraumatic, conjunttiva clear, no obvious abnormalities on inspection of external nose and ears  NECK: normal movements of the head and neck  LUNGS: on inspection no signs of respiratory distress, breathing rate appears normal, no obvious gross SOB, gasping or wheezing  CV: no obvious cyanosis  MS: moves all visible extremities without noticeable abnormality  PSYCH/NEURO: pleasant and cooperative, no obvious depression or anxiety, speech and thought processing grossly intact  ASSESSMENT AND PLAN:  Discussed the following assessment and plan:  Prediabetes - Plan: Comprehensive metabolic panel, Hemoglobin A1c  Tobacco abuse counseling  Hyperlipidemia LDL goal <100  Overweight (BMI 25.0-29.9)  Tobacco abuse counseling Risks of continued tobacco use were discussed. She is not currently interested in tobacco cessation.   Hyperlipidemia LDL goal <100 Managed with  Lipitor every other day, and HDL is increased significantly.continue every other day Lipitor given evidence of cerebrovascular disease on prior CT  .  She is due for repeat labs   Lab Results  Component Value Date   CHOL 212 (H) 06/16/2018   HDL 73.80 06/16/2018   LDLCALC 119 (H) 06/16/2018   TRIG 96.0 06/16/2018   CHOLHDL 3 06/16/2018    Lab Results  Component Value Date   ALT 12 06/16/2018   AST 17 06/16/2018   ALKPHOS 40 06/16/2018   BILITOT 0.7 06/16/2018     Overweight (BMI 25.0-29.9) She has lost 22 lbs with Saxenda,presecribed by a [facility].  She is at risk for diabetes based on prior a1c  And is following a low GI diet . She has plateaued and continues to avoid regular exercise which was  strongly encouraged,  Both to reach goal of 160 lbs and to maintain Optimal weight.   Lab Results  Component Value Date   HGBA1C 5.9 06/11/2017   Lab Results  Component Value Date   ALT 12 06/16/2018   AST 17 06/16/2018   ALKPHOS 40 06/16/2018   BILITOT 0.7 06/16/2018     Prediabetes Her a1c had dropped from 6.3 to 5.9 and her fasting glucoses remain nondiagnostic. Recommended low GI diet and regular participation in  exercise and repeat testing Lab Results  Component Value Date   HGBA1C 5.9 06/11/2017        I discussed the assessment and treatment plan with the patient. The patient was provided an opportunity to ask questions and all were answered. The patient agreed with the plan and demonstrated an understanding of the instructions.  The patient was advised to call back or seek an in-person evaluation if the symptoms worsen or if the condition fails to improve as anticipated.  I provided 25 minutes of non-face-to-face time during this encounter.   Crecencio Mc, MD

## 2018-12-25 NOTE — Assessment & Plan Note (Signed)
Managed with  Lipitor every other day, and HDL is increased significantly.continue every other day Lipitor given evidence of cerebrovascular disease on prior CT  .  She is due for repeat labs   Lab Results  Component Value Date   CHOL 212 (H) 06/16/2018   HDL 73.80 06/16/2018   LDLCALC 119 (H) 06/16/2018   TRIG 96.0 06/16/2018   CHOLHDL 3 06/16/2018    Lab Results  Component Value Date   ALT 12 06/16/2018   AST 17 06/16/2018   ALKPHOS 40 06/16/2018   BILITOT 0.7 06/16/2018

## 2018-12-25 NOTE — Assessment & Plan Note (Signed)
Risks of continued tobacco use were discussed. She is not currently interested in tobacco cessation.    

## 2018-12-25 NOTE — Assessment & Plan Note (Signed)
Her a1c had dropped from 6.3 to 5.9 and her fasting glucoses remain nondiagnostic. Recommended low GI diet and regular participation in  exercise and repeat testing Lab Results  Component Value Date   HGBA1C 5.9 06/11/2017

## 2018-12-25 NOTE — Assessment & Plan Note (Signed)
She has lost 22 lbs with Saxenda,presecribed by a [facility].  She is at risk for diabetes based on prior a1c  And is following a low GI diet . She has plateaued and continues to avoid regular exercise which was strongly encouraged,  Both to reach goal of 160 lbs and to maintain Optimal weight.   Lab Results  Component Value Date   HGBA1C 5.9 06/11/2017   Lab Results  Component Value Date   ALT 12 06/16/2018   AST 17 06/16/2018   ALKPHOS 40 06/16/2018   BILITOT 0.7 06/16/2018

## 2018-12-30 ENCOUNTER — Other Ambulatory Visit: Payer: Self-pay | Admitting: Internal Medicine

## 2019-01-06 ENCOUNTER — Other Ambulatory Visit: Payer: Managed Care, Other (non HMO)

## 2019-01-13 ENCOUNTER — Other Ambulatory Visit: Payer: Managed Care, Other (non HMO)

## 2019-01-13 ENCOUNTER — Telehealth: Payer: Self-pay

## 2019-01-13 NOTE — Telephone Encounter (Signed)
Mailbox is full.   Copied from [place] [phone]. Topic: Appointment Scheduling - Scheduling Inquiry for Clinic >> Jan 13, 2019  1:08 PM Margot Ables wrote: Reason for CRM: pt called to [facility] lab appt. Hoping to come in today. [facility] line stating disconnected (system outage?). Please call back.

## 2019-01-18 NOTE — Telephone Encounter (Signed)
Lab appt scheduled for 01/21/2019.

## 2019-01-21 ENCOUNTER — Other Ambulatory Visit (INDEPENDENT_AMBULATORY_CARE_PROVIDER_SITE_OTHER): Payer: Managed Care, Other (non HMO)

## 2019-01-21 ENCOUNTER — Other Ambulatory Visit: Payer: Self-pay

## 2019-01-21 DIAGNOSIS — R7303 Prediabetes: Secondary | ICD-10-CM | POA: Diagnosis not present

## 2019-01-21 LAB — COMPREHENSIVE METABOLIC PANEL
ALT: 14 U/L (ref 0–35)
AST: 17 U/L (ref 0–37)
Albumin: 4.6 g/dL (ref 3.5–5.2)
Alkaline Phosphatase: 48 U/L (ref 39–117)
BUN: 15 mg/dL (ref 6–23)
CO2: 30 mEq/L (ref 19–32)
Calcium: 10.2 mg/dL (ref 8.4–10.5)
Chloride: 98 mEq/L (ref 96–112)
Creatinine, Ser: 1.03 mg/dL (ref 0.40–1.20)
GFR: 65.24 mL/min (ref >60.00)
Glucose, Bld: 79 mg/dL (ref 70–99)
Potassium: 5 mEq/L (ref 3.5–5.1)
Sodium: 136 mEq/L (ref 135–145)
Total Bilirubin: 0.6 mg/dL (ref 0.2–1.2)
Total Protein: 7 g/dL (ref 6.0–8.3)

## 2019-01-24 LAB — HEMOGLOBIN A1C: Hgb A1c MFr Bld: 5.8 % (ref 4.6–6.5)

## 2019-02-14 ENCOUNTER — Other Ambulatory Visit: Payer: Self-pay | Admitting: Internal Medicine

## 2019-03-02 ENCOUNTER — Other Ambulatory Visit: Payer: Self-pay | Admitting: Internal Medicine

## 2019-04-19 ENCOUNTER — Other Ambulatory Visit: Payer: Self-pay | Admitting: Internal Medicine

## 2019-07-21 ENCOUNTER — Other Ambulatory Visit: Payer: Self-pay | Admitting: Internal Medicine

## 2019-07-21 DIAGNOSIS — E785 Hyperlipidemia, unspecified: Secondary | ICD-10-CM

## 2019-07-21 DIAGNOSIS — R7303 Prediabetes: Secondary | ICD-10-CM

## 2019-07-21 DIAGNOSIS — I10 Essential (primary) hypertension: Secondary | ICD-10-CM

## 2019-07-21 NOTE — Telephone Encounter (Signed)
Medication has been Refilled for 30 days.  She needs labs and an  OV prior to any more refills.

## 2019-07-21 NOTE — Telephone Encounter (Signed)
Refilled: 04/20/2019 Last OV: 12/22/2018 Next OV: not scheduled

## 2019-08-15 ENCOUNTER — Other Ambulatory Visit: Payer: Self-pay | Admitting: Internal Medicine

## 2019-08-15 NOTE — Telephone Encounter (Signed)
Mailbox is full.

## 2019-08-19 ENCOUNTER — Other Ambulatory Visit: Payer: Self-pay

## 2019-08-19 ENCOUNTER — Other Ambulatory Visit: Payer: Self-pay | Admitting: Internal Medicine

## 2019-08-19 DIAGNOSIS — Z1231 Encounter for screening mammogram for malignant neoplasm of breast: Secondary | ICD-10-CM

## 2019-08-24 ENCOUNTER — Other Ambulatory Visit: Payer: Self-pay | Admitting: Internal Medicine

## 2019-09-09 ENCOUNTER — Telehealth: Payer: Self-pay

## 2019-09-09 MED ORDER — ESTRADIOL 1 MG PO TABS: 1.0000 mg | ORAL_TABLET | Freq: Every day | ORAL | 5 refills | Status: DC

## 2019-09-09 NOTE — Addendum Note (Signed)
Addended by: Crecencio Mc on: 09/09/2019 01:04 PM   Modules accepted: Orders

## 2019-09-09 NOTE — Telephone Encounter (Signed)
Estradiol sent to pharmacy

## 2019-09-09 NOTE — Telephone Encounter (Signed)
Patients insurance is wanting pt to try the estradiol 1mg  tablet before they will approve the premarin. The estradiol is a formulary drug on her insurance.

## 2019-09-09 NOTE — Telephone Encounter (Signed)
Spoke with Sarah Delgado to let her know that Dr. Derrel Nip sent in the estradiol tablet for her to take since her insurance denied the Premarin. Sarah Delgado gave a verbal understanding.

## 2019-09-21 ENCOUNTER — Ambulatory Visit
Admission: RE | Admit: 2019-09-21 | Discharge: 2019-09-21 | Disposition: A | Discharge status: Home / Self Care | Payer: Managed Care, Other (non HMO) | Source: Ambulatory Visit | Attending: Internal Medicine | Admitting: Internal Medicine

## 2019-09-21 ENCOUNTER — Telehealth: Payer: Self-pay | Admitting: Internal Medicine

## 2019-09-21 DIAGNOSIS — I10 Essential (primary) hypertension: Secondary | ICD-10-CM

## 2019-09-21 DIAGNOSIS — Z1231 Encounter for screening mammogram for malignant neoplasm of breast: Secondary | ICD-10-CM | POA: Insufficient documentation

## 2019-09-21 DIAGNOSIS — R7303 Prediabetes: Secondary | ICD-10-CM

## 2019-09-21 DIAGNOSIS — E785 Hyperlipidemia, unspecified: Secondary | ICD-10-CM

## 2019-09-21 NOTE — Telephone Encounter (Signed)
Pt requesting refill of triamterene hctz 37.5-25 1/2 pill daily #45  received in my box today  Will CC PCP and CMA

## 2019-09-22 MED ORDER — TRIAMTERENE-HCTZ 37.5-25 MG PO TABS: 0.5000 | ORAL_TABLET | Freq: Every day | ORAL | 0 refills | Status: DC

## 2019-09-22 NOTE — Addendum Note (Signed)
Addended by: Crecencio Mc on: 09/22/2019 08:07 AM   Modules accepted: Orders

## 2019-09-27 ENCOUNTER — Encounter: Payer: Self-pay | Admitting: Internal Medicine

## 2019-09-27 ENCOUNTER — Telehealth: Payer: Self-pay

## 2019-09-27 DIAGNOSIS — I10 Essential (primary) hypertension: Secondary | ICD-10-CM

## 2019-09-27 MED ORDER — TRIAMTERENE-HCTZ 37.5-25 MG PO TABS: 0.5000 | ORAL_TABLET | Freq: Every day | ORAL | 0 refills | Status: DC

## 2019-09-27 MED ORDER — SAXENDA 18 MG/3ML ~~LOC~~ SOPN: 3.0000 mg | PEN_INJECTOR | Freq: Every day | SUBCUTANEOUS | 0 refills | Status: DC

## 2019-09-27 NOTE — Telephone Encounter (Signed)
Refilled medication for 30 days only. Had to move pt's appt out to 10/10/2019. She is having labs done on 10/07/2019. Labs have been ordered.

## 2019-09-28 ENCOUNTER — Encounter: Payer: Managed Care, Other (non HMO) | Admitting: Internal Medicine

## 2019-10-03 ENCOUNTER — Telehealth: Payer: Self-pay

## 2019-10-03 NOTE — Telephone Encounter (Signed)
PA for saxenda has been submitted on covermymeds.  

## 2019-10-07 ENCOUNTER — Other Ambulatory Visit: Payer: Self-pay

## 2019-10-07 ENCOUNTER — Other Ambulatory Visit (INDEPENDENT_AMBULATORY_CARE_PROVIDER_SITE_OTHER): Payer: Managed Care, Other (non HMO)

## 2019-10-07 DIAGNOSIS — R7303 Prediabetes: Secondary | ICD-10-CM | POA: Diagnosis not present

## 2019-10-07 DIAGNOSIS — E785 Hyperlipidemia, unspecified: Secondary | ICD-10-CM

## 2019-10-07 LAB — COMPREHENSIVE METABOLIC PANEL
ALT: 11 U/L (ref 0–35)
AST: 15 U/L (ref 0–37)
Albumin: 4.2 g/dL (ref 3.5–5.2)
Alkaline Phosphatase: 47 U/L (ref 39–117)
BUN: 16 mg/dL (ref 6–23)
CO2: 27 mEq/L (ref 19–32)
Calcium: 10 mg/dL (ref 8.4–10.5)
Chloride: 101 mEq/L (ref 96–112)
Creatinine, Ser: 1.08 mg/dL (ref 0.40–1.20)
GFR: 61.63 mL/min (ref >60.00)
Glucose, Bld: 90 mg/dL (ref 70–99)
Potassium: 4.4 mEq/L (ref 3.5–5.1)
Sodium: 135 mEq/L (ref 135–145)
Total Bilirubin: 0.4 mg/dL (ref 0.2–1.2)
Total Protein: 6.9 g/dL (ref 6.0–8.3)

## 2019-10-07 LAB — LIPID PANEL
Cholesterol: 205 mg/dL — ABNORMAL HIGH (ref 0–200)
HDL: 63.4 mg/dL (ref >39.00)
LDL Cholesterol: 124 mg/dL — ABNORMAL HIGH (ref 0–99)
NonHDL: 141.66
Total CHOL/HDL Ratio: 3
Triglycerides: 86 mg/dL (ref 0.0–149.0)
VLDL: 17.2 mg/dL (ref 0.0–40.0)

## 2019-10-07 LAB — HEMOGLOBIN A1C: Hgb A1c MFr Bld: 5.6 % (ref 4.6–6.5)

## 2019-10-07 NOTE — Telephone Encounter (Signed)
PA for Kirke Shaggy has been approved through 10/02/2020.

## 2019-10-10 ENCOUNTER — Telehealth: Payer: Managed Care, Other (non HMO) | Admitting: Internal Medicine

## 2019-10-21 ENCOUNTER — Encounter: Payer: Self-pay | Admitting: Internal Medicine

## 2019-10-21 ENCOUNTER — Telehealth (INDEPENDENT_AMBULATORY_CARE_PROVIDER_SITE_OTHER): Payer: Managed Care, Other (non HMO) | Admitting: Internal Medicine

## 2019-10-21 VITALS — Ht 67.0 in | Wt 178.0 lb

## 2019-10-21 DIAGNOSIS — E785 Hyperlipidemia, unspecified: Secondary | ICD-10-CM | POA: Diagnosis not present

## 2019-10-21 DIAGNOSIS — I10 Essential (primary) hypertension: Secondary | ICD-10-CM

## 2019-10-21 DIAGNOSIS — E663 Overweight: Secondary | ICD-10-CM | POA: Diagnosis not present

## 2019-10-21 DIAGNOSIS — Z1211 Encounter for screening for malignant neoplasm of colon: Secondary | ICD-10-CM | POA: Diagnosis not present

## 2019-10-21 MED ORDER — ESTRADIOL 1 MG PO TABS: 1.0000 mg | ORAL_TABLET | Freq: Every day | ORAL | 3 refills | Status: DC

## 2019-10-21 MED ORDER — SAXENDA 18 MG/3ML ~~LOC~~ SOPN: 3.0000 mg | PEN_INJECTOR | Freq: Every day | SUBCUTANEOUS | 0 refills | Status: DC

## 2019-10-21 MED ORDER — ATORVASTATIN CALCIUM 10 MG PO TABS: ORAL_TABLET | ORAL | 0 refills | Status: DC

## 2019-10-21 MED ORDER — TRIAMTERENE-HCTZ 37.5-25 MG PO TABS: 0.5000 | ORAL_TABLET | Freq: Every day | ORAL | 1 refills | Status: DC

## 2019-10-21 NOTE — Progress Notes (Signed)
Virtual Visit via [place]  This visit type was conducted due to national recommendations for restrictions regarding the COVID-19 pandemic (e.g. social distancing).  This format is felt to be most appropriate for this patient at this time.  All issues noted in this document were discussed and addressed.  No physical exam was performed (except for noted visual exam findings with Video Visits).   I connected with@ on 10/21/19 at  1:00 PM EDT by a video enabled telemedicine application  and verified that I am speaking with the correct person using two identifiers. Location patient: home Location provider: work or home office Persons participating in the virtual visit: patient, provider  I discussed the limitations, risks, security and privacy concerns of performing an evaluation and management service by telephone and the availability of in person appointments. I also discussed with the patient that there may be a patient responsible charge related to this service. The patient expressed understanding and agreed to proceed.  Reason for visit: medication refill   HPI:  64 yr old female with prediabetes , hypertension,  Overweight .  Overweight:  managed with Saxenda since 2018 , started initially by an outside weight loss center . Goal wt is 160 lbs has not been exercising regularly.  No change in weight.   HTN/hyperlipidemia:  Patient is taking her medications as prescribed and notes no adverse effects.  Home BP readings have been done about once per week and are  generally < 130/80 .  She is avoiding added salt in her diet.    Patient has received both doses of the available COVID 19 vaccine without complications.  Patient continues to mask when outside of the home except when walking in yard or at safe distances from others .  Patient denies any change in mood or development of unhealthy behaviors resuting from the pandemic's restriction of activities and socialization.     ROS: See pertinent  positives and negatives per HPI.  Past Medical History:  Diagnosis Date  . Chicken pox   . Hyperlipidemia   . Hypertension   . Tobacco abuse 09/16/2012   Social smoker on weekends.      Past Surgical History:  Procedure Laterality Date  . ABDOMINAL HYSTERECTOMY    . BREAST SURGERY  2000   reduction  . REDUCTION MAMMAPLASTY Bilateral    at least 15 years ago  . TOTAL ABDOMINAL HYSTERECTOMY W/ BILATERAL SALPINGOOPHORECTOMY  2005    Family History  Problem Relation Age of Onset  . Arthritis Mother   . Hypertension Mother   . Mental illness Mother   . Emphysema Father   . COPD Sister   . Diabetes Sister   . Kidney disease Sister   . Diabetes Brother   . Hypertension Brother   . Heart disease Maternal Grandfather   . Diabetes Brother   . Hypertension Brother   . Cancer Brother        prostate  . Hypertension Brother   . Stroke Brother   . Hypertension Brother   . Cancer Maternal Aunt        esophageal ca,  history of etoh and tobacco   . Breast cancer Neg Hx     SOCIAL HX:  reports that she has quit smoking. Her smoking use included cigarettes. She started smoking about 2 years ago. She has never used smokeless tobacco. She reports current alcohol use. She reports that she does not use drugs.   Current Outpatient Medications:  .  aspirin EC 81 MG  tablet, Take 81 mg by mouth daily., Disp: , Rfl:  .  atorvastatin (LIPITOR) 10 MG tablet, TAKE ONE TABLET EVERY OTHER DAY., Disp: 45 tablet, Rfl: 0 .  Biotin (PA BIOTIN) 1000 MCG tablet, Take 1,000 mcg by mouth daily. , Disp: , Rfl:  .  Cholecalciferol (VITAMIN D-3) 1000 units CAPS, Take 1 capsule by mouth daily., Disp: , Rfl:  .  Coenzyme Q10 10 MG capsule, Take 10 mg by mouth daily., Disp: , Rfl:  .  estradiol (ESTRACE) 1 MG tablet, Take 1 tablet (1 mg total) by mouth daily., Disp: 90 tablet, Rfl: 3 .  SAXENDA 18 MG/3ML SOPN, Inject 0.5 mLs (3 mg total) into the skin daily., Disp: 45 mL, Rfl: 0 .   triamterene-hydrochlorothiazide (MAXZIDE-25) 37.5-25 MG tablet, Take 0.5 tablets by mouth daily., Disp: 45 tablet, Rfl: 1 .  vitamin B-12 (CYANOCOBALAMIN) 500 MCG tablet, Take 500 mcg daily by mouth., Disp: , Rfl:  .  vitamin E (VITAMIN E) 400 UNIT capsule, Take 400 Units daily by mouth., Disp: , Rfl:   EXAM:  VITALS per patient if applicable:  GENERAL: alert, oriented, appears well and in no acute distress  HEENT: atraumatic, conjunttiva clear, no obvious abnormalities on inspection of external nose and ears  NECK: normal movements of the head and neck  LUNGS: on inspection no signs of respiratory distress, breathing rate appears normal, no obvious gross SOB, gasping or wheezing  CV: no obvious cyanosis  MS: moves all visible extremities without noticeable abnormality  PSYCH/NEURO: pleasant and cooperative, no obvious depression or anxiety, speech and thought processing grossly intact  ASSESSMENT AND PLAN:  Discussed the following assessment and plan:  Colon cancer screening - Plan: Ambulatory referral to Gastroenterology  Essential hypertension - Plan: triamterene-hydrochlorothiazide (MAXZIDE-25) 37.5-25 MG tablet  Overweight (BMI 25.0-29.9)  Essential hypertension, benign  Hyperlipidemia LDL goal <100  Overweight (BMI 25.0-29.9) She initially lost 22 lbs with Saxenda,, but her weight has plateaued  Due to lack of exercise.  Encouraged to start exercising daily  to reach goal of 160 lbs and to maintain Optimal weight.   Lab Results  Component Value Date   HGBA1C 5.6 10/07/2019   Lab Results  Component Value Date   ALT 11 10/07/2019   AST 15 10/07/2019   ALKPHOS 47 10/07/2019   BILITOT 0.4 10/07/2019     Essential hypertension, benign Well controlled on current regimen. Renal function stable, no changes today.  Hyperlipidemia LDL goal <100 Managed with  Lipitor every other day, and HDL is increased significantly.continue every other day Lipitor given evidence  of cerebrovascular disease on prior CT  .  Lab Results  Component Value Date   CHOL 205 (H) 10/07/2019   HDL 63.40 10/07/2019   LDLCALC 124 (H) 10/07/2019   TRIG 86.0 10/07/2019   CHOLHDL 3 10/07/2019    Lab Results  Component Value Date   ALT 11 10/07/2019   AST 15 10/07/2019   ALKPHOS 47 10/07/2019   BILITOT 0.4 10/07/2019       I discussed the assessment and treatment plan with the patient. The patient was provided an opportunity to ask questions and all were answered. The patient agreed with the plan and demonstrated an understanding of the instructions.   The patient was advised to call back or seek an in-person evaluation if the symptoms worsen or if the condition fails to improve as anticipated.  I provided  25 minutes of non-face-to-face time during this encounter reviewing patient's current problems and past procedures/imaging  studies, providing counseling on the above mentioned problems , and coordination  of care .   Crecencio Mc, MD

## 2019-10-23 ENCOUNTER — Encounter: Payer: Self-pay | Admitting: Internal Medicine

## 2019-10-23 NOTE — Assessment & Plan Note (Addendum)
She initially lost 22 lbs with Saxenda,, but her weight has plateaued  Due to lack of exercise.  Encouraged to start exercising daily  to reach goal of 160 lbs and to maintain Optimal weight.   Lab Results  Component Value Date   HGBA1C 5.6 10/07/2019   Lab Results  Component Value Date   ALT 11 10/07/2019   AST 15 10/07/2019   ALKPHOS 47 10/07/2019   BILITOT 0.4 10/07/2019

## 2019-10-23 NOTE — Assessment & Plan Note (Signed)
Well controlled on current regimen. Renal function stable, no changes today. 

## 2019-10-23 NOTE — Assessment & Plan Note (Signed)
Managed with  Lipitor every other day, and HDL is increased significantly.continue every other day Lipitor given evidence of cerebrovascular disease on prior CT  .  Lab Results  Component Value Date   CHOL 205 (H) 10/07/2019   HDL 63.40 10/07/2019   LDLCALC 124 (H) 10/07/2019   TRIG 86.0 10/07/2019   CHOLHDL 3 10/07/2019    Lab Results  Component Value Date   ALT 11 10/07/2019   AST 15 10/07/2019   ALKPHOS 47 10/07/2019   BILITOT 0.4 10/07/2019

## 2019-10-28 ENCOUNTER — Telehealth (INDEPENDENT_AMBULATORY_CARE_PROVIDER_SITE_OTHER): Payer: Self-pay | Admitting: Gastroenterology

## 2019-10-28 VITALS — Ht 67.0 in | Wt 181.0 lb

## 2019-10-28 DIAGNOSIS — Z1211 Encounter for screening for malignant neoplasm of colon: Secondary | ICD-10-CM

## 2019-10-28 MED ORDER — NA SULFATE-K SULFATE-MG SULF 17.5-3.13-1.6 GM/177ML PO SOLN: 1.0000 | Freq: Once | ORAL | 0 refills | Status: AC

## 2019-10-28 NOTE — Progress Notes (Signed)
Gastroenterology Pre-Procedure Review  Request Date: May 7th Requesting Physician: Dr. Vicente Males  PATIENT REVIEW QUESTIONS: The patient responded to the following health history questions as indicated:    1. Are you having any GI issues? no 2. Do you have a personal history of Polyps? no 3. Do you have a family history of Colon Cancer or Polyps? no 4. Diabetes Mellitus? no 5. Joint replacements in the past 12 months?no 6. Major health problems in the past 3 months?no 7. Any artificial heart valves, MVP, or defibrillator?no    MEDICATIONS & ALLERGIES:    Patient reports the following regarding taking any anticoagulation/antiplatelet therapy:   Plavix, Coumadin, Eliquis, Xarelto, Lovenox, Pradaxa, Brilinta, or Effient? no Aspirin? Yes 81 mg daily  Patient confirms/reports the following medications:  Current Outpatient Medications  Medication Sig Dispense Refill  . aspirin EC 81 MG tablet Take 81 mg by mouth daily.    Marland Kitchen atorvastatin (LIPITOR) 10 MG tablet TAKE ONE TABLET EVERY OTHER DAY. 45 tablet 0  . Biotin (PA BIOTIN) 1000 MCG tablet Take 1,000 mcg by mouth daily.     . Cholecalciferol (VITAMIN D-3) 1000 units CAPS Take 1 capsule by mouth daily.    . Coenzyme Q10 10 MG capsule Take 10 mg by mouth daily.    Marland Kitchen estradiol (ESTRACE) 1 MG tablet Take 1 tablet (1 mg total) by mouth daily. 90 tablet 3  . SAXENDA 18 MG/3ML SOPN Inject 0.5 mLs (3 mg total) into the skin daily. 45 mL 0  . triamterene-hydrochlorothiazide (MAXZIDE-25) 37.5-25 MG tablet Take 0.5 tablets by mouth daily. 45 tablet 1  . vitamin B-12 (CYANOCOBALAMIN) 500 MCG tablet Take 500 mcg daily by mouth.    . vitamin E (VITAMIN E) 400 UNIT capsule Take 400 Units daily by mouth.     No current facility-administered medications for this visit.    Patient confirms/reports the following allergies:  No Known Allergies  No orders of the defined types were placed in this encounter.   AUTHORIZATION INFORMATION Primary  Insurance: 1D#: Group #:  Secondary Insurance: 1D#: Group #:  SCHEDULE INFORMATION: Date: May 7th Time: Location:[facility]

## 2019-10-28 NOTE — Patient Instructions (Signed)
Colonoscopy has been scheduled for Friday May 7th at [facility] with Dr. Alfredia Ferguson.  Call Thursday May 6th to Endoscopy Dept at 1pm for arrival time.  [phone]  Mandatory COVID Testing Wed May 5th [place].  Arrive between 8am-1pm for drive up testing.  Rx for Suprep has been sent to [facility].  Your colonoscopy instructions can be viewed under the letters tab in mychart, and a copy of instructions will be mailed to you.  If you have any questions, please the office.  Sharyn Lull, [facility] [phone]

## 2019-11-01 ENCOUNTER — Other Ambulatory Visit: Payer: Self-pay

## 2019-11-01 DIAGNOSIS — Z1211 Encounter for screening for malignant neoplasm of colon: Secondary | ICD-10-CM

## 2019-11-01 MED ORDER — PEG 3350-KCL-NA BICARB-NACL 420 G PO SOLR: 4000.0000 mL | Freq: Once | ORAL | 0 refills | Status: AC

## 2019-11-23 ENCOUNTER — Telehealth: Payer: Self-pay

## 2019-11-23 DIAGNOSIS — Z1211 Encounter for screening for malignant neoplasm of colon: Secondary | ICD-10-CM

## 2019-11-23 NOTE — Telephone Encounter (Signed)
Patients colonoscopy has been rescheduled per her request from 12/09/19 to 12/27/19 with Dr. Vicente Males still.  Pt has been advised of her new COVID Test date 05/21, and instructions will be updated and sent via mychart.  Referral updated to reflect new date.  Thanks,  [place], [place]

## 2019-12-22 ENCOUNTER — Other Ambulatory Visit: Payer: Self-pay

## 2019-12-22 MED ORDER — PEG 3350-KCL-NA BICARB-NACL 420 G PO SOLR: 4000.0000 mL | Freq: Once | ORAL | 0 refills | Status: AC

## 2019-12-23 ENCOUNTER — Other Ambulatory Visit
Admission: RE | Admit: 2019-12-23 | Discharge: 2019-12-23 | Disposition: A | Discharge status: Home / Self Care | Payer: Managed Care, Other (non HMO) | Source: Ambulatory Visit | Attending: Gastroenterology | Admitting: Gastroenterology

## 2019-12-23 DIAGNOSIS — Z01812 Encounter for preprocedural laboratory examination: Secondary | ICD-10-CM | POA: Insufficient documentation

## 2019-12-23 DIAGNOSIS — Z20822 Contact with and (suspected) exposure to covid-19: Secondary | ICD-10-CM | POA: Insufficient documentation

## 2019-12-23 LAB — SARS CORONAVIRUS 2 (TAT 6-24 HRS): SARS Coronavirus 2: NEGATIVE

## 2019-12-27 ENCOUNTER — Ambulatory Visit: Payer: Managed Care, Other (non HMO) | Admitting: Anesthesiology

## 2019-12-27 ENCOUNTER — Encounter: Payer: Self-pay | Admitting: Gastroenterology

## 2019-12-27 ENCOUNTER — Other Ambulatory Visit: Payer: Self-pay

## 2019-12-27 ENCOUNTER — Encounter
Admission: RE | Disposition: A | Discharge status: Home / Self Care | Payer: Self-pay | Source: Home / Self Care | Attending: Gastroenterology

## 2019-12-27 ENCOUNTER — Ambulatory Visit
Admission: RE | Admit: 2019-12-27 | Discharge: 2019-12-27 | Disposition: A | Discharge status: Home / Self Care | Payer: Managed Care, Other (non HMO) | Attending: Gastroenterology | Admitting: Gastroenterology

## 2019-12-27 DIAGNOSIS — D122 Benign neoplasm of ascending colon: Secondary | ICD-10-CM | POA: Insufficient documentation

## 2019-12-27 DIAGNOSIS — Z8261 Family history of arthritis: Secondary | ICD-10-CM | POA: Diagnosis not present

## 2019-12-27 DIAGNOSIS — Z8042 Family history of malignant neoplasm of prostate: Secondary | ICD-10-CM | POA: Diagnosis not present

## 2019-12-27 DIAGNOSIS — Z841 Family history of disorders of kidney and ureter: Secondary | ICD-10-CM | POA: Diagnosis not present

## 2019-12-27 DIAGNOSIS — D125 Benign neoplasm of sigmoid colon: Secondary | ICD-10-CM | POA: Insufficient documentation

## 2019-12-27 DIAGNOSIS — Z7982 Long term (current) use of aspirin: Secondary | ICD-10-CM | POA: Diagnosis not present

## 2019-12-27 DIAGNOSIS — Z90710 Acquired absence of both cervix and uterus: Secondary | ICD-10-CM | POA: Insufficient documentation

## 2019-12-27 DIAGNOSIS — Z833 Family history of diabetes mellitus: Secondary | ICD-10-CM | POA: Insufficient documentation

## 2019-12-27 DIAGNOSIS — Z1211 Encounter for screening for malignant neoplasm of colon: Secondary | ICD-10-CM | POA: Diagnosis present

## 2019-12-27 DIAGNOSIS — Z79899 Other long term (current) drug therapy: Secondary | ICD-10-CM | POA: Insufficient documentation

## 2019-12-27 DIAGNOSIS — Z825 Family history of asthma and other chronic lower respiratory diseases: Secondary | ICD-10-CM | POA: Diagnosis not present

## 2019-12-27 DIAGNOSIS — F17200 Nicotine dependence, unspecified, uncomplicated: Secondary | ICD-10-CM | POA: Diagnosis not present

## 2019-12-27 DIAGNOSIS — Z8249 Family history of ischemic heart disease and other diseases of the circulatory system: Secondary | ICD-10-CM | POA: Insufficient documentation

## 2019-12-27 DIAGNOSIS — I10 Essential (primary) hypertension: Secondary | ICD-10-CM | POA: Insufficient documentation

## 2019-12-27 DIAGNOSIS — Z800 Family history of malignant neoplasm of digestive organs: Secondary | ICD-10-CM | POA: Insufficient documentation

## 2019-12-27 DIAGNOSIS — Z823 Family history of stroke: Secondary | ICD-10-CM | POA: Diagnosis not present

## 2019-12-27 DIAGNOSIS — E785 Hyperlipidemia, unspecified: Secondary | ICD-10-CM | POA: Insufficient documentation

## 2019-12-27 DIAGNOSIS — D126 Benign neoplasm of colon, unspecified: Secondary | ICD-10-CM

## 2019-12-27 HISTORY — PX: COLONOSCOPY WITH PROPOFOL: SHX5780

## 2019-12-27 SURGERY — COLONOSCOPY WITH PROPOFOL
Anesthesia: General

## 2019-12-27 MED ORDER — SODIUM CHLORIDE 0.9 % IV SOLN
INTRAVENOUS | Status: DC
  Administered 2019-12-27: 1000 mL via INTRAVENOUS

## 2019-12-27 MED ORDER — PROPOFOL 500 MG/50ML IV EMUL
INTRAVENOUS | Status: AC
  Filled 2019-12-27: qty 100

## 2019-12-27 MED ORDER — PROPOFOL 500 MG/50ML IV EMUL
INTRAVENOUS | Status: DC | PRN
  Administered 2019-12-27: 120 ug/kg/min via INTRAVENOUS

## 2019-12-27 NOTE — Anesthesia Procedure Notes (Signed)
Performed by: Cook-Martin, Zahki Hoogendoorn Pre-anesthesia Checklist: Patient identified, Emergency Drugs available, Suction available, Patient being monitored and Timeout performed Patient Re-evaluated:Patient Re-evaluated prior to induction Oxygen Delivery Method: Nasal cannula Preoxygenation: Pre-oxygenation with 100% oxygen Induction Type: IV induction Placement Confirmation: CO2 detector and positive ETCO2       

## 2019-12-27 NOTE — H&P (Signed)
Jonathon Bellows, MD [address] [place], [place], [place], [address] [address], [place], [place], [place], [address] Phone: [phone]  Fax: [phone]  Primary Care Physician:  Crecencio Mc, MD   Pre-Procedure History & Physical: HPI:  Sarah Delgado is a 64 y.o. female is here for an colonoscopy.   Past Medical History:  Diagnosis Date  . Chicken pox   . Hyperlipidemia   . Hypertension   . Tobacco abuse 09/16/2012   Social smoker on weekends.      Past Surgical History:  Procedure Laterality Date  . ABDOMINAL HYSTERECTOMY    . BREAST SURGERY  2000   reduction  . REDUCTION MAMMAPLASTY Bilateral    at least 15 years ago  . TOTAL ABDOMINAL HYSTERECTOMY W/ BILATERAL SALPINGOOPHORECTOMY  2005    Prior to Admission medications   Medication Sig Start Date End Date Taking? Authorizing Provider  aspirin EC 81 MG tablet Take 81 mg by mouth daily.   Yes [provider]  atorvastatin (LIPITOR) 10 MG tablet TAKE ONE TABLET EVERY OTHER DAY. 10/21/19  Yes Crecencio Mc, MD  Biotin (PA BIOTIN) 1000 MCG tablet Take 1,000 mcg by mouth daily.    Yes [provider]  Cholecalciferol (VITAMIN D-3) 1000 units CAPS Take 1 capsule by mouth daily.   Yes [provider]  Coenzyme Q10 10 MG capsule Take 10 mg by mouth daily.   Yes [provider]  estradiol (ESTRACE) 1 MG tablet Take 1 tablet (1 mg total) by mouth daily. 10/21/19  Yes Crecencio Mc, MD  SAXENDA 18 MG/3ML SOPN Inject 0.5 mLs (3 mg total) into the skin daily. 10/21/19  Yes Crecencio Mc, MD  triamterene-hydrochlorothiazide (MAXZIDE-25) 37.5-25 MG tablet Take 0.5 tablets by mouth daily. 10/21/19  Yes Crecencio Mc, MD  vitamin B-12 (CYANOCOBALAMIN) 500 MCG tablet Take 500 mcg daily by mouth.   Yes [provider]  vitamin E (VITAMIN E) 400 UNIT capsule Take 400 Units daily by mouth.   Yes [provider]    Allergies as of 10/28/2019  . (No Known  Allergies)    Family History  Problem Relation Age of Onset  . Arthritis Mother   . Hypertension Mother   . Mental illness Mother   . Emphysema Father   . COPD Sister   . Diabetes Sister   . Kidney disease Sister   . Diabetes Brother   . Hypertension Brother   . Heart disease Maternal Grandfather   . Diabetes Brother   . Hypertension Brother   . Cancer Brother        prostate  . Hypertension Brother   . Stroke Brother   . Hypertension Brother   . Cancer Maternal Aunt        esophageal ca,  history of etoh and tobacco   . Breast cancer Neg Hx     Social History   Socioeconomic History  . Marital status: Divorced    Spouse name: Not on file  . Number of children: Not on file  . Years of education: 16 plus  . Highest education level: Not on file  Occupational History  . Occupation: Science writer: [employer]  Tobacco Use  . Smoking status: Former Smoker    Types: Cigarettes    Start date: 11/18/2016  . Smokeless tobacco: Never Used  Substance and Sexual Activity  . Alcohol use: Yes  . Drug use: No  . Sexual  activity: Yes    Partners: Male  Other Topics Concern  . Not on file  Social History Narrative   Tourist information centre manager, works in [place] Strain:   . Difficulty of Paying Living Expenses:   Food Insecurity:   . Worried About Charity fundraiser in the Last Year:   . Arboriculturist in the Last Year:   Transportation Needs:   . Film/video editor (Medical):   Marland Kitchen Lack of Transportation (Non-Medical):   Physical Activity:   . Days of Exercise per Week:   . Minutes of Exercise per Session:   Stress:   . Feeling of Stress :   Social Connections:   . Frequency of Communication with Friends and Family:   . Frequency of Social Gatherings with Friends and Family:   . Attends Religious Services:   . Active Member of Clubs or Organizations:   . Attends Archivist  Meetings:   Marland Kitchen Marital Status:   Intimate Partner Violence:   . Fear of Current or Ex-Partner:   . Emotionally Abused:   Marland Kitchen Physically Abused:   . Sexually Abused:     Review of Systems: See HPI, otherwise negative ROS  Physical Exam: BP 121/75   Pulse 65   Temp (!) 97 F (36.1 C) (Temporal)   Resp 18   Ht 5\' 7"  (1.702 m)   Wt 81.8 kg   SpO2 100%   BMI 28.25 kg/m  General:   Alert,  pleasant and cooperative in NAD Head:  Normocephalic and atraumatic. Neck:  Supple; no masses or thyromegaly. Lungs:  Clear throughout to auscultation, normal respiratory effort.    Heart:  +S1, +S2, Regular rate and rhythm, No edema. Abdomen:  Soft, nontender and nondistended. Normal bowel sounds, without guarding, and without rebound.   Neurologic:  Alert and  oriented x4;  grossly normal neurologically.  Impression/Plan: Sarah Delgado is here for an colonoscopy to be performed for Screening colonoscopy average risk   Risks, benefits, limitations, and alternatives regarding  colonoscopy have been reviewed with the patient.  Questions have been answered.  All parties agreeable.   Jonathon Bellows, MD  12/27/2019, 8:50 AM

## 2019-12-27 NOTE — Anesthesia Postprocedure Evaluation (Signed)
Anesthesia Post Note  Patient: Sarah Delgado  Procedure(s) Performed: COLONOSCOPY WITH PROPOFOL (N/A )  Patient location during evaluation: Endoscopy Anesthesia Type: General Level of consciousness: awake and alert Pain management: pain level controlled Vital Signs Assessment: post-procedure vital signs reviewed and stable Respiratory status: spontaneous breathing, nonlabored ventilation, respiratory function stable and patient connected to nasal cannula oxygen Cardiovascular status: blood pressure returned to baseline and stable Postop Assessment: no apparent nausea or vomiting Anesthetic complications: no     Last Vitals:  Vitals:   12/27/19 0950 12/27/19 1000  BP: 99/65 112/60  Pulse: (!) 58 (!) 54  Resp: 14 11  Temp:    SpO2: 100% 100%    Last Pain:  Vitals:   12/27/19 0933  TempSrc: Temporal  PainSc:                  [name]

## 2019-12-27 NOTE — Op Note (Signed)
[facility] Patient Name: Sarah Delgado Procedure Date: 12/27/2019 9:02 AM MRN: [id number] Account #: [id number] Date of Birth: 1955-10-06 Admit Type: Outpatient Age: 64 Room: [facility] ENDO ROOM 1 Gender: Female Note Status: Finalized Procedure:             Colonoscopy Indications:           Screening for colorectal malignant neoplasm Providers:             Jonathon Bellows MD, MD Referring MD:          Deborra Medina, MD (Referring MD) Medicines:             Monitored Anesthesia Care Complications:         No immediate complications. Procedure:             Pre-Anesthesia Assessment:                        - Prior to the procedure, a History and Physical was                         performed, and patient medications, allergies and                         sensitivities were reviewed. The patient's tolerance                         of previous anesthesia was reviewed.                        - The risks and benefits of the procedure and the                         sedation options and risks were discussed with the                         patient. All questions were answered and informed                         consent was obtained.                        - ASA Grade Assessment: II - A patient with mild                         systemic disease.                        After obtaining informed consent, the colonoscope was                         passed under direct vision. Throughout the procedure,                         the patient's blood pressure, pulse, and oxygen                         saturations were monitored continuously. The                         Colonoscope was introduced through the anus and  advanced to the the cecum, identified by the                         appendiceal orifice. The colonoscopy was performed                         with ease. The patient tolerated the procedure well.                         The quality of  the bowel preparation was excellent. Findings:      The perianal and digital rectal examinations were normal.      A 10 mm polyp was found in the ascending colon. The polyp was sessile.       The polyp was removed with a cold snare. Resection and retrieval were       complete. To prevent bleeding after the polypectomy, one hemostatic clip       was successfully placed. There was no bleeding at the end of the       procedure.      A 4 mm polyp was found in the sigmoid colon. The polyp was sessile. The       polyp was removed with a cold biopsy forceps. Resection and retrieval       were complete.      The exam was otherwise without abnormality on direct and retroflexion       views. Impression:            - One 10 mm polyp in the ascending colon, removed with                         a cold snare. Resected and retrieved. Clip was placed.                        - One 4 mm polyp in the sigmoid colon, removed with a                         cold biopsy forceps. Resected and retrieved.                        - The examination was otherwise normal on direct and                         retroflexion views. Recommendation:        - Discharge patient to home (with escort).                        - Resume previous diet.                        - Continue present medications.                        - Await pathology results.                        - Repeat colonoscopy for surveillance based on                         pathology results. Procedure Code(s):     ---  Professional ---                        [phone], Colonoscopy, flexible; with removal of                         tumor(s), polyp(s), or other lesion(s) by snare                         technique                        45380, 59, Colonoscopy, flexible; with biopsy, single                         or multiple Diagnosis Code(s):     --- Professional ---                        Z12.11, Encounter for screening for malignant neoplasm                          of colon                        K63.5, Polyp of colon CPT copyright 2019 American Medical Association. All rights reserved. The codes documented in this report are preliminary and upon coder review may  be revised to meet current compliance requirements. Jonathon Bellows, MD Jonathon Bellows MD, MD 12/27/2019 9:30:10 AM This report has been signed electronically. Number of Addenda: 0 Note Initiated On: 12/27/2019 9:02 AM Scope Withdrawal Time: 0 hours 13 minutes 15 seconds  Total Procedure Duration: 0 hours 19 minutes 56 seconds  Estimated Blood Loss:  Estimated blood loss: none.      [facility]

## 2019-12-27 NOTE — Anesthesia Preprocedure Evaluation (Signed)
Anesthesia Evaluation  Patient identified by MRN, date of birth, ID band Patient awake    Reviewed: Allergy & Precautions, H&P , NPO status , Patient's Chart, lab work & pertinent test results, reviewed documented beta blocker date and time   History of Anesthesia Complications Negative for: history of anesthetic complications  Airway Mallampati: I  TM Distance: >3 FB Neck ROM: full    Dental  (+) Dental Advidsory Given, Teeth Intact   Pulmonary neg pulmonary ROS, former smoker,    Pulmonary exam normal breath sounds clear to auscultation       Cardiovascular Exercise Tolerance: Good hypertension, (-) angina(-) Past MI and (-) Cardiac Stents Normal cardiovascular exam(-) dysrhythmias + Valvular Problems/Murmurs  Rhythm:regular Rate:Normal     Neuro/Psych negative neurological ROS  negative psych ROS   GI/Hepatic negative GI ROS, Neg liver ROS,   Endo/Other  negative endocrine ROS  Renal/GU negative Renal ROS  negative genitourinary   Musculoskeletal   Abdominal   Peds  Hematology negative hematology ROS (+)   Anesthesia Other Findings Past Medical History: No date: Chicken pox No date: Hyperlipidemia No date: Hypertension 09/16/2012: Tobacco abuse     Comment:  Social smoker on weekends.     Reproductive/Obstetrics negative OB ROS                             Anesthesia Physical Anesthesia Plan  ASA: II  Anesthesia Plan: General   Post-op Pain Management:    Induction: Intravenous  PONV Risk Score and Plan: 3 and Propofol infusion and TIVA  Airway Management Planned: Nasal Cannula and Natural Airway  Additional Equipment:   Intra-op Plan:   Post-operative Plan:   Informed Consent: I have reviewed the patients History and Physical, chart, labs and discussed the procedure including the risks, benefits and alternatives for the proposed anesthesia with the patient or  authorized representative who has indicated his/her understanding and acceptance.     Dental Advisory Given  Plan Discussed with: Anesthesiologist, CRNA and Surgeon  Anesthesia Plan Comments:         Anesthesia Quick Evaluation

## 2019-12-27 NOTE — Transfer of Care (Signed)
Immediate Anesthesia Transfer of Care Note  Patient: Sarah Delgado  Procedure(s) Performed: COLONOSCOPY WITH PROPOFOL (N/A )  Patient Location: PACU  Anesthesia Type:General  Level of Consciousness: awake and sedated  Airway & Oxygen Therapy: Patient Spontanous Breathing and Patient connected to nasal cannula oxygen  Post-op Assessment: Report given to RN and Post -op Vital signs reviewed and stable  Post vital signs: Reviewed and stable  Last Vitals:  Vitals Value Taken Time  BP 98/61 12/27/19 0931  Temp    Pulse 61 12/27/19 0932  Resp 18 12/27/19 0932  SpO2 100 % 12/27/19 0932  Vitals shown include unvalidated device data.  Last Pain:  Vitals:   12/27/19 0840  TempSrc: Temporal  PainSc: 0-No pain         Complications: No apparent anesthesia complications

## 2019-12-28 ENCOUNTER — Encounter: Payer: Self-pay | Admitting: *Deleted

## 2019-12-28 LAB — SURGICAL PATHOLOGY

## 2020-01-03 ENCOUNTER — Encounter: Payer: Self-pay | Admitting: Gastroenterology

## 2020-03-03 ENCOUNTER — Other Ambulatory Visit: Payer: Self-pay | Admitting: Internal Medicine

## 2020-03-08 ENCOUNTER — Other Ambulatory Visit: Payer: Self-pay

## 2020-03-08 MED ORDER — ATORVASTATIN CALCIUM 10 MG PO TABS: ORAL_TABLET | ORAL | 1 refills | Status: DC

## 2020-03-19 ENCOUNTER — Other Ambulatory Visit: Payer: Self-pay | Admitting: Internal Medicine

## 2020-03-25 ENCOUNTER — Telehealth: Payer: Managed Care, Other (non HMO) | Admitting: Family

## 2020-03-25 DIAGNOSIS — M545 Low back pain, unspecified: Secondary | ICD-10-CM

## 2020-03-25 MED ORDER — NAPROXEN 500 MG PO TABS: 500.0000 mg | ORAL_TABLET | Freq: Two times a day (BID) | ORAL | 0 refills | Status: DC

## 2020-03-25 MED ORDER — BACLOFEN 10 MG PO TABS: 10.0000 mg | ORAL_TABLET | Freq: Three times a day (TID) | ORAL | 0 refills | Status: DC

## 2020-03-25 NOTE — Progress Notes (Signed)

## 2020-05-12 ENCOUNTER — Other Ambulatory Visit: Payer: Self-pay | Admitting: Internal Medicine

## 2020-05-14 ENCOUNTER — Other Ambulatory Visit: Payer: Self-pay

## 2020-05-14 MED ORDER — SAXENDA 18 MG/3ML ~~LOC~~ SOPN: 3.0000 mg | PEN_INJECTOR | Freq: Every day | SUBCUTANEOUS | 0 refills | Status: DC

## 2020-05-19 ENCOUNTER — Other Ambulatory Visit: Payer: Self-pay | Admitting: Internal Medicine

## 2020-06-08 ENCOUNTER — Other Ambulatory Visit: Payer: Self-pay | Admitting: Internal Medicine

## 2020-06-08 DIAGNOSIS — I10 Essential (primary) hypertension: Secondary | ICD-10-CM

## 2020-07-16 ENCOUNTER — Other Ambulatory Visit: Payer: Self-pay | Admitting: Internal Medicine

## 2020-07-16 DIAGNOSIS — I10 Essential (primary) hypertension: Secondary | ICD-10-CM

## 2020-08-22 ENCOUNTER — Other Ambulatory Visit: Payer: Self-pay | Admitting: Internal Medicine

## 2020-08-22 DIAGNOSIS — I10 Essential (primary) hypertension: Secondary | ICD-10-CM

## 2020-09-10 ENCOUNTER — Other Ambulatory Visit: Payer: Self-pay | Admitting: Internal Medicine

## 2020-09-21 ENCOUNTER — Other Ambulatory Visit: Payer: Self-pay | Admitting: Internal Medicine

## 2020-09-21 DIAGNOSIS — I10 Essential (primary) hypertension: Secondary | ICD-10-CM

## 2020-10-05 ENCOUNTER — Telehealth: Payer: Self-pay | Admitting: Internal Medicine

## 2020-10-05 NOTE — Telephone Encounter (Signed)
PA submitted through [employer] for Saxenda.

## 2020-10-10 ENCOUNTER — Telehealth: Payer: Self-pay | Admitting: Internal Medicine

## 2020-10-10 NOTE — Telephone Encounter (Signed)
[employer] called about prior appeal for patient [phone]

## 2020-10-18 NOTE — Telephone Encounter (Signed)
Spoke with pt to let her know that her saxenda has been denied by insurance because we do not have an up to date weight. Appeal can not be done until pt has been seen in the office. Pt has scheduled an appt.

## 2020-10-22 ENCOUNTER — Other Ambulatory Visit: Payer: Self-pay | Admitting: Internal Medicine

## 2020-10-22 DIAGNOSIS — I10 Essential (primary) hypertension: Secondary | ICD-10-CM

## 2020-10-24 ENCOUNTER — Other Ambulatory Visit: Payer: Self-pay

## 2020-10-29 ENCOUNTER — Ambulatory Visit: Payer: Managed Care, Other (non HMO) | Admitting: Internal Medicine

## 2020-10-29 NOTE — Telephone Encounter (Signed)
Noted, appointment changed to 11/12/20

## 2020-11-06 ENCOUNTER — Other Ambulatory Visit: Payer: Self-pay | Admitting: Internal Medicine

## 2020-11-12 ENCOUNTER — Encounter: Payer: Self-pay | Admitting: Internal Medicine

## 2020-11-12 ENCOUNTER — Ambulatory Visit: Payer: Managed Care, Other (non HMO) | Admitting: Internal Medicine

## 2020-11-12 ENCOUNTER — Other Ambulatory Visit: Payer: Self-pay

## 2020-11-12 VITALS — BP 122/78 | HR 66 | Temp 98.4°F | Resp 14 | Ht 67.0 in | Wt 181.0 lb

## 2020-11-12 DIAGNOSIS — R7303 Prediabetes: Secondary | ICD-10-CM | POA: Diagnosis not present

## 2020-11-12 DIAGNOSIS — E785 Hyperlipidemia, unspecified: Secondary | ICD-10-CM

## 2020-11-12 DIAGNOSIS — E663 Overweight: Secondary | ICD-10-CM | POA: Diagnosis not present

## 2020-11-12 DIAGNOSIS — Z1231 Encounter for screening mammogram for malignant neoplasm of breast: Secondary | ICD-10-CM | POA: Diagnosis not present

## 2020-11-12 MED ORDER — ZOSTER VAC RECOMB ADJUVANTED 50 MCG/0.5ML IM SUSR
0.5000 mL | Freq: Once | INTRAMUSCULAR | 1 refills | Status: AC
Start: 2020-11-12 — End: 2020-11-12

## 2020-11-12 MED ORDER — EPINEPHRINE 0.3 MG/0.3ML IJ SOAJ: 0.3000 mg | INTRAMUSCULAR | 1 refills | Status: DC | PRN

## 2020-11-12 NOTE — Progress Notes (Signed)
Subjective:  Patient ID: Genelle Bal, female    DOB: 1956/02/18  Age: 65 y.o. MRN: [id number]  CC: The primary encounter diagnosis was Encounter for screening mammogram for malignant neoplasm of breast. Diagnoses of Hyperlipidemia LDL goal <100, Prediabetes, and Overweight (BMI 25.0-29.9) were also pertinent to this visit.  HPI AMEILIA RATTAN presents for follow up on weight management. Last seen march 2021   This visit occurred during the SARS-CoV-2 public health emergency.  Safety protocols were in place, including screening questions prior to the visit, additional usage of staff PPE, and extensive cleaning of exam room while observing appropriate contact time as indicated for disinfecting solutions.   History:  She was initially prescribed Saxenda in 2018 by a [place] [facility] and lost 22 lbs but then plateaued due to lack of exercise  Personal best 174,  Personal worst 198 She presented in March 2021 with a request to continue  Saxenda and encouraged to start an exercise program.  Recently her insurance denied coverage due to lack of follow up and lack of benefit. She is requesting an alternative medication .  She does not exercise regularly or follow a prescribed diet plan.  She is struggling currently with the abrupt end of a romantic relationship of 12 years that ended with a text from her boyfriend 3 weeks ago.  She has developed strong emotional ties with her ex boyfriend's family but has been financially independent of him since the beginning of the relationship and remains angry and frustrated at his cowardly management of their breakup.  She has been having some difficulty sleeping but is managing this with benadryl.  Lab Results  Component Value Date   HGBA1C 5.6 10/07/2019      Outpatient Medications Prior to Visit  Medication Sig Dispense Refill  . aspirin EC 81 MG tablet Take 81 mg by mouth daily.    Marland Kitchen atorvastatin (LIPITOR) 10 MG tablet Take one  tablet every other day. 15 tablet 0  . Biotin 1000 MCG tablet Take 1,000 mcg by mouth daily.     . Cholecalciferol (VITAMIN D-3) 1000 units CAPS Take 1 capsule by mouth daily.    . Coenzyme Q10 10 MG capsule Take 10 mg by mouth daily.    Marland Kitchen estradiol (ESTRACE) 1 MG tablet Take 1 tablet (1 mg total) by mouth daily. 90 tablet 3  . triamterene-hydrochlorothiazide (MAXZIDE-25) 37.5-25 MG tablet Take 0.5 tablets by mouth daily. 15 tablet 0  . vitamin B-12 (CYANOCOBALAMIN) 500 MCG tablet Take 500 mcg daily by mouth.    . vitamin E 180 MG (400 UNITS) capsule Take 400 Units daily by mouth.    . baclofen (LIORESAL) 10 MG tablet Take 1 tablet (10 mg total) by mouth 3 (three) times daily. (Patient not taking: Reported on 11/12/2020) 30 each 0  . naproxen (NAPROSYN) 500 MG tablet Take 1 tablet (500 mg total) by mouth 2 (two) times daily with a meal. (Patient not taking: Reported on 11/12/2020) 30 tablet 0  . SAXENDA 18 MG/3ML SOPN Inject 3 mg into the skin daily. (Patient not taking: Reported on 11/12/2020) 15 mL 0   No facility-administered medications prior to visit.    Review of Systems;  Patient denies headache, fevers, malaise, unintentional weight loss, skin rash, eye pain, sinus congestion and sinus pain, sore throat, dysphagia,  hemoptysis , cough, dyspnea, wheezing, chest pain, palpitations, orthopnea, edema, abdominal pain, nausea, melena, diarrhea, constipation, flank pain, dysuria, hematuria, urinary  Frequency, nocturia, numbness, tingling,  seizures,  Focal weakness, Loss of consciousness,  Tremor, insomnia, depression, anxiety, and suicidal ideation.      Objective:  BP 122/78 (BP Location: Left Arm, Patient Position: Sitting, Cuff Size: Normal)   Pulse 66   Temp 98.4 F (36.9 C) (Oral)   Resp 14   Ht 5\' 7"  (1.702 m)   Wt 181 lb (82.1 kg)   SpO2 99%   BMI 28.35 kg/m   BP Readings from Last 3 Encounters:  11/12/20 122/78  12/27/19 112/60  09/01/18 118/72    Wt Readings from Last 3  Encounters:  11/12/20 181 lb (82.1 kg)  12/27/19 180 lb 5.7 oz (81.8 kg)  10/28/19 181 lb (82.1 kg)    General appearance: alert, cooperative and appears stated age Ears: normal TM's and external ear canals both ears Throat: lips, mucosa, and tongue normal; teeth and gums normal Neck: no adenopathy, no carotid bruit, supple, symmetrical, trachea midline and thyroid not enlarged, symmetric, no tenderness/mass/nodules Back: symmetric, no curvature. ROM normal. No CVA tenderness. Lungs: clear to auscultation bilaterally Heart: regular rate and rhythm, S1, S2 normal, no murmur, click, rub or gallop Abdomen: soft, non-tender; bowel sounds normal; no masses,  no organomegaly Pulses: 2+ and symmetric Skin: Skin color, texture, turgor normal. No rashes or lesions Lymph nodes: Cervical, supraclavicular, and axillary nodes normal.  Lab Results  Component Value Date   HGBA1C 5.6 10/07/2019   HGBA1C 5.8 01/21/2019   HGBA1C 5.9 06/11/2017    Lab Results  Component Value Date   CREATININE 1.08 10/07/2019   CREATININE 1.03 01/21/2019   CREATININE 1.03 06/16/2018    Lab Results  Component Value Date   WBC 7.5 09/01/2018   HGB 14.2 09/01/2018   HCT 42.8 09/01/2018   PLT 342.0 09/01/2018   GLUCOSE 90 10/07/2019   CHOL 205 (H) 10/07/2019   TRIG 86.0 10/07/2019   HDL 63.40 10/07/2019   LDLCALC 124 (H) 10/07/2019   ALT 11 10/07/2019   AST 15 10/07/2019   NA 135 10/07/2019   K 4.4 10/07/2019   CL 101 10/07/2019   CREATININE 1.08 10/07/2019   BUN 16 10/07/2019   CO2 27 10/07/2019   TSH 0.87 06/16/2018   HGBA1C 5.6 10/07/2019   MICROALBUR <0.7 06/11/2017    No results found.  Assessment & Plan:   Problem List Items Addressed This Visit      Unprioritized   Hyperlipidemia LDL goal <100   Relevant Medications   EPINEPHrine 0.3 mg/0.3 mL IJ SOAJ injection   Other Relevant Orders   Lipid panel   TSH   Overweight (BMI 25.0-29.9)    I spent  30 minutes addressing her approach  to weight loss, her current emotional state, and  Recommended a review of the [place] and  participation in a regular exercise a minimum of 5 days per week. She has had difficulty losing weight due to increased appetite.  She would like to resume a medication for appetite suppression.  We discussed Wegovy/Ozempic, depending on insurance coverage.       Prediabetes   Relevant Orders   Hemoglobin A1c   Comprehensive metabolic panel    Other Visit Diagnoses    Encounter for screening mammogram for malignant neoplasm of breast    -  Primary   Relevant Orders   MM 3D SCREEN BREAST BILATERAL      I have discontinued Derry L. Brigance's naproxen, baclofen, and Saxenda. I am also having her start on EPINEPHrine, Zoster Vaccine Adjuvanted, and Semaglutide-Weight Management.  Additionally, I am having her maintain her Coenzyme Q10, Biotin, Vitamin D-3, aspirin EC, vitamin B-12, vitamin E, estradiol, atorvastatin, and triamterene-hydrochlorothiazide.  Meds ordered this encounter  Medications  . EPINEPHrine 0.3 mg/0.3 mL IJ SOAJ injection    Sig: Inject 0.3 mg into the muscle as needed for anaphylaxis.    Dispense:  1 each    Refill:  1  . Zoster Vaccine Adjuvanted [facility]) injection    Sig: Inject 0.5 mLs into the muscle once for 1 dose.    Dispense:  1 each    Refill:  1  . Semaglutide-Weight Management 0.25 MG/0.5ML SOAJ    Sig: Inject 0.25 mg into the skin once a week.    Dispense:  2 mL    Refill:  0    Medications Discontinued During This Encounter  Medication Reason  . baclofen (LIORESAL) 10 MG tablet   . naproxen (NAPROSYN) 500 MG tablet   . SAXENDA 18 MG/3ML SOPN     Follow-up: Return in about 3 months (around 02/11/2021).   Crecencio Mc, MD

## 2020-11-12 NOTE — Patient Instructions (Signed)
I recommend taking an Allegra 180 mg (OTC antihistamine) and 20 mg famotidine before your next dinner that may include seafood  Do not use the EPI pen unless you develop chest tightness,  Thick tongue or swelling of lipids     the [employer] diet works for anybody.  The diet utilizes 5 feedings per day which are 100 calorie meals chosen and purchased from their menu,  and one "lean and green meal"  that you supply on your own for dinner.  The cost , per my patients,  is $400 month for the food you order through them.  (one month , 30 x 5 = 150 meals)  . This would double if both of you  and your husband did it.  Many people are finding the cost prohibitive and creating their own menu from locally available prepared foods,, shakes and protein bars.       We will try to get [facility] or [employer] through your insurance   You MUST ADD EXERCISE DAILY TO YOUR PLAN.  30 MINUTES  Semaglutide injection solution What is this medicine? SEMAGLUTIDE (Sem a GLOO tide) is used to improve blood sugar control in adults with type 2 diabetes. This medicine may be used with other diabetes medicines. This drug may also reduce the risk of heart attack or stroke if you have type 2 diabetes and risk factors for heart disease. This medicine may be used for other purposes; ask your health care provider or pharmacist if you have questions. COMMON BRAND NAME(S): OZEMPIC What should I tell my health care provider before I take this medicine? They need to know if you have any of these conditions:  endocrine tumors (MEN 2) or if someone in your family had these tumors  eye disease, vision problems  history of pancreatitis  kidney disease  stomach problems  thyroid cancer or if someone in your family had thyroid cancer  an unusual or allergic reaction to semaglutide, other medicines, foods, dyes, or preservatives  pregnant or trying to get pregnant  breast-feeding How should I use this medicine? This medicine is for  injection under the skin of your upper leg (thigh), stomach area, or upper arm. It is given once every week (every 7 days). You will be taught how to prepare and give this medicine. Use exactly as directed. Take your medicine at regular intervals. Do not take it more often than directed. If you use this medicine with insulin, you should inject this medicine and the insulin separately. Do not mix them together. Do not give the injections right next to each other. Change (rotate) injection sites with each injection. It is important that you put your used needles and syringes in a special sharps container. Do not put them in a trash can. If you do not have a sharps container, call your pharmacist or healthcare provider to get one. A special MedGuide will be given to you by the pharmacist with each prescription and refill. Be sure to read this information carefully each time. This drug comes with INSTRUCTIONS FOR USE. Ask your pharmacist for directions on how to use this drug. Read the information carefully. Talk to your pharmacist or health care provider if you have questions. Talk to your pediatrician regarding the use of this medicine in children. Special care may be needed. Overdosage: If you think you have taken too much of this medicine contact a poison control center or emergency room at once. NOTE: This medicine is only for you. Do not share  this medicine with others. What if I miss a dose? If you miss a dose, take it as soon as you can within 5 days after the missed dose. Then take your next dose at your regular weekly time. If it has been longer than 5 days after the missed dose, do not take the missed dose. Take the next dose at your regular time. Do not take double or extra doses. If you have questions about a missed dose, contact your health care provider for advice. What may interact with this medicine?  other medicines for diabetes Many medications may cause changes in blood sugar, these  include:  alcohol containing beverages  antiviral medicines for HIV or AIDS  aspirin and aspirin-like drugs  certain medicines for blood pressure, heart disease, irregular heart beat  chromium  diuretics  female hormones, such as estrogens or progestins, birth control pills  fenofibrate  gemfibrozil  isoniazid  lanreotide  female hormones or anabolic steroids  MAOIs like Carbex, Eldepryl, Marplan, Nardil, and Parnate  medicines for weight loss  medicines for allergies, asthma, cold, or cough  medicines for depression, anxiety, or psychotic disturbances  niacin  nicotine  NSAIDs, medicines for pain and inflammation, like ibuprofen or naproxen  octreotide  pasireotide  pentamidine  phenytoin  probenecid  quinolone antibiotics such as ciprofloxacin, levofloxacin, ofloxacin  some herbal dietary supplements  steroid medicines such as prednisone or cortisone  sulfamethoxazole; trimethoprim  thyroid hormones Some medications can hide the warning symptoms of low blood sugar (hypoglycemia). You may need to monitor your blood sugar more closely if you are taking one of these medications. These include:  beta-blockers, often used for high blood pressure or heart problems (examples include atenolol, metoprolol, propranolol)  clonidine  guanethidine  reserpine This list may not describe all possible interactions. Give your health care provider a list of all the medicines, herbs, non-prescription drugs, or dietary supplements you use. Also tell them if you smoke, drink alcohol, or use illegal drugs. Some items may interact with your medicine. What should I watch for while using this medicine? Visit your doctor or health care professional for regular checks on your progress. Drink plenty of fluids while taking this medicine. Check with your doctor or health care professional if you get an attack of severe diarrhea, nausea, and vomiting. The loss of too much body  fluid can make it dangerous for you to take this medicine. A test called the HbA1C (A1C) will be monitored. This is a simple blood test. It measures your blood sugar control over the last 2 to 3 months. You will receive this test every 3 to 6 months. Learn how to check your blood sugar. Learn the symptoms of low and high blood sugar and how to manage them. Always carry a quick-source of sugar with you in case you have symptoms of low blood sugar. Examples include hard sugar candy or glucose tablets. Make sure others know that you can choke if you eat or drink when you develop serious symptoms of low blood sugar, such as seizures or unconsciousness. They must get medical help at once. Tell your doctor or health care professional if you have high blood sugar. You might need to change the dose of your medicine. If you are sick or exercising more than usual, you might need to change the dose of your medicine. Do not skip meals. Ask your doctor or health care professional if you should avoid alcohol. Many nonprescription cough and cold products contain sugar or alcohol.  These can affect blood sugar. Pens should never be shared. Even if the needle is changed, sharing may result in passing of viruses like hepatitis or HIV. Wear a medical ID bracelet or chain, and carry a card that describes your disease and details of your medicine and dosage times. Do not become pregnant while taking this medicine. Women should inform their doctor if they wish to become pregnant or think they might be pregnant. There is a potential for serious side effects to an unborn child. Talk to your health care professional or pharmacist for more information. What side effects may I notice from receiving this medicine? Side effects that you should report to your doctor or health care professional as soon as possible:  allergic reactions like skin rash, itching or hives, swelling of the face, lips, or tongue  breathing  problems  changes in vision  diarrhea that continues or is severe  lump or swelling on the neck  severe nausea  signs and symptoms of infection like fever or chills; cough; sore throat; pain or trouble passing urine  signs and symptoms of low blood sugar such as feeling anxious, confusion, dizziness, increased hunger, unusually weak or tired, sweating, shakiness, cold, irritable, headache, blurred vision, fast heartbeat, loss of consciousness  signs and symptoms of kidney injury like trouble passing urine or change in the amount of urine  trouble swallowing  unusual stomach upset or pain  vomiting Side effects that usually do not require medical attention (report to your doctor or health care professional if they continue or are bothersome):  constipation  diarrhea  nausea  pain, redness, or irritation at site where injected  stomach upset This list may not describe all possible side effects. Call your doctor for medical advice about side effects. You may report side effects to FDA at 1-800-FDA-1088. Where should I keep my medicine? Keep out of the reach of children. Store unopened pens in a refrigerator between 2 and 8 degrees C (36 and 46 degrees F). Do not freeze. Protect from light and heat. After you first use the pen, it can be stored for 56 days at room temperature between 15 and 30 degrees C (59 and 86 degrees F) or in a refrigerator. Throw away your used pen after 56 days or after the expiration date, whichever comes first. Do not store your pen with the needle attached. If the needle is left on, medicine may leak from the pen. NOTE: This sheet is a summary. It may not cover all possible information. If you have questions about this medicine, talk to your doctor, pharmacist, or health care provider.  2021 Elsevier/Gold Standard (2019-04-05 09:41:51)

## 2020-11-13 LAB — COMPREHENSIVE METABOLIC PANEL
ALT: 11 U/L (ref 0–35)
AST: 14 U/L (ref 0–37)
Albumin: 4.4 g/dL (ref 3.5–5.2)
Alkaline Phosphatase: 41 U/L (ref 39–117)
BUN: 12 mg/dL (ref 6–23)
CO2: 27 mEq/L (ref 19–32)
Calcium: 10.2 mg/dL (ref 8.4–10.5)
Chloride: 98 mEq/L (ref 96–112)
Creatinine, Ser: 0.97 mg/dL (ref 0.40–1.20)
GFR: 61.12 mL/min (ref >60.00)
Glucose, Bld: 79 mg/dL (ref 70–99)
Potassium: 4.5 mEq/L (ref 3.5–5.1)
Sodium: 136 mEq/L (ref 135–145)
Total Bilirubin: 0.5 mg/dL (ref 0.2–1.2)
Total Protein: 7.4 g/dL (ref 6.0–8.3)

## 2020-11-13 LAB — LIPID PANEL
Cholesterol: 193 mg/dL (ref 0–200)
HDL: 64.9 mg/dL (ref >39.00)
LDL Cholesterol: 112 mg/dL — ABNORMAL HIGH (ref 0–99)
NonHDL: 128
Total CHOL/HDL Ratio: 3
Triglycerides: 81 mg/dL (ref 0.0–149.0)
VLDL: 16.2 mg/dL (ref 0.0–40.0)

## 2020-11-13 LAB — TSH: TSH: 0.87 u[IU]/mL (ref 0.35–4.50)

## 2020-11-13 LAB — HEMOGLOBIN A1C: Hgb A1c MFr Bld: 5.9 % (ref 4.6–6.5)

## 2020-11-13 MED ORDER — SEMAGLUTIDE-WEIGHT MANAGEMENT 0.25 MG/0.5ML ~~LOC~~ SOAJ: 0.2500 mg | SUBCUTANEOUS | 0 refills | Status: DC

## 2020-11-13 NOTE — Assessment & Plan Note (Signed)
I spent  30 minutes addressing her approach to weight loss, her current emotional state, and  Recommended a review of the [place] and  participation in a regular exercise a minimum of 5 days per week. She has had difficulty losing weight due to increased appetite.  She would like to resume a medication for appetite suppression.  We discussed Wegovy/Ozempic, depending on insurance coverage.

## 2020-11-17 ENCOUNTER — Other Ambulatory Visit: Payer: Self-pay | Admitting: Internal Medicine

## 2020-11-17 DIAGNOSIS — I10 Essential (primary) hypertension: Secondary | ICD-10-CM

## 2020-11-30 ENCOUNTER — Other Ambulatory Visit: Payer: Self-pay

## 2020-11-30 ENCOUNTER — Ambulatory Visit
Admission: RE | Admit: 2020-11-30 | Discharge: 2020-11-30 | Disposition: A | Discharge status: Home / Self Care | Payer: Managed Care, Other (non HMO) | Source: Ambulatory Visit | Attending: Internal Medicine | Admitting: Internal Medicine

## 2020-11-30 DIAGNOSIS — Z1231 Encounter for screening mammogram for malignant neoplasm of breast: Secondary | ICD-10-CM | POA: Insufficient documentation

## 2020-12-03 ENCOUNTER — Other Ambulatory Visit: Payer: Self-pay | Admitting: Internal Medicine

## 2020-12-03 DIAGNOSIS — R928 Other abnormal and inconclusive findings on diagnostic imaging of breast: Secondary | ICD-10-CM

## 2020-12-03 DIAGNOSIS — N6489 Other specified disorders of breast: Secondary | ICD-10-CM

## 2020-12-04 NOTE — Telephone Encounter (Signed)
PA for Wegovy has been submitted on covermymeds.  °

## 2020-12-07 ENCOUNTER — Ambulatory Visit
Admission: RE | Admit: 2020-12-07 | Discharge: 2020-12-07 | Disposition: A | Discharge status: Home / Self Care | Payer: Managed Care, Other (non HMO) | Source: Ambulatory Visit | Attending: Internal Medicine | Admitting: Internal Medicine

## 2020-12-07 ENCOUNTER — Ambulatory Visit: Payer: Managed Care, Other (non HMO)

## 2020-12-07 ENCOUNTER — Other Ambulatory Visit: Payer: Managed Care, Other (non HMO)

## 2020-12-07 ENCOUNTER — Other Ambulatory Visit: Payer: Self-pay

## 2020-12-07 DIAGNOSIS — N6489 Other specified disorders of breast: Secondary | ICD-10-CM

## 2020-12-07 DIAGNOSIS — R928 Other abnormal and inconclusive findings on diagnostic imaging of breast: Secondary | ICD-10-CM

## 2020-12-11 ENCOUNTER — Other Ambulatory Visit: Payer: Self-pay | Admitting: Internal Medicine

## 2020-12-11 DIAGNOSIS — N6320 Unspecified lump in the left breast, unspecified quadrant: Secondary | ICD-10-CM

## 2020-12-11 DIAGNOSIS — R928 Other abnormal and inconclusive findings on diagnostic imaging of breast: Secondary | ICD-10-CM

## 2020-12-12 NOTE — Telephone Encounter (Signed)
PA has been approved through 07/06/2021. Mychart message letting pt know.

## 2020-12-12 NOTE — Telephone Encounter (Signed)
Ms Butters,  If the radiologist has offered to perform your biopsy,  I would l et them do it for you.  Regards,   Sarah Medina, MD

## 2020-12-14 ENCOUNTER — Other Ambulatory Visit: Payer: Self-pay

## 2020-12-14 ENCOUNTER — Ambulatory Visit
Admission: RE | Admit: 2020-12-14 | Discharge: 2020-12-14 | Disposition: A | Discharge status: Home / Self Care | Payer: Managed Care, Other (non HMO) | Source: Ambulatory Visit | Attending: Internal Medicine | Admitting: Internal Medicine

## 2020-12-14 DIAGNOSIS — R928 Other abnormal and inconclusive findings on diagnostic imaging of breast: Secondary | ICD-10-CM | POA: Diagnosis present

## 2020-12-14 DIAGNOSIS — N6320 Unspecified lump in the left breast, unspecified quadrant: Secondary | ICD-10-CM

## 2020-12-14 HISTORY — PX: BREAST BIOPSY: SHX20

## 2020-12-15 ENCOUNTER — Other Ambulatory Visit: Payer: Self-pay | Admitting: Internal Medicine

## 2020-12-15 DIAGNOSIS — I10 Essential (primary) hypertension: Secondary | ICD-10-CM

## 2020-12-17 ENCOUNTER — Other Ambulatory Visit: Payer: Self-pay | Admitting: Internal Medicine

## 2020-12-17 DIAGNOSIS — N6320 Unspecified lump in the left breast, unspecified quadrant: Secondary | ICD-10-CM

## 2020-12-17 DIAGNOSIS — R928 Other abnormal and inconclusive findings on diagnostic imaging of breast: Secondary | ICD-10-CM

## 2020-12-17 LAB — SURGICAL PATHOLOGY

## 2020-12-19 ENCOUNTER — Ambulatory Visit
Admission: RE | Admit: 2020-12-19 | Discharge: 2020-12-19 | Disposition: A | Discharge status: Home / Self Care | Payer: Managed Care, Other (non HMO) | Source: Ambulatory Visit | Attending: Internal Medicine | Admitting: Internal Medicine

## 2020-12-19 ENCOUNTER — Other Ambulatory Visit: Payer: Self-pay

## 2020-12-19 DIAGNOSIS — R928 Other abnormal and inconclusive findings on diagnostic imaging of breast: Secondary | ICD-10-CM | POA: Diagnosis not present

## 2020-12-19 DIAGNOSIS — N6320 Unspecified lump in the left breast, unspecified quadrant: Secondary | ICD-10-CM | POA: Diagnosis present

## 2020-12-20 ENCOUNTER — Encounter: Payer: Self-pay | Admitting: Internal Medicine

## 2020-12-20 DIAGNOSIS — N6320 Unspecified lump in the left breast, unspecified quadrant: Secondary | ICD-10-CM | POA: Insufficient documentation

## 2020-12-20 LAB — SURGICAL PATHOLOGY

## 2021-01-16 ENCOUNTER — Other Ambulatory Visit: Payer: Self-pay

## 2021-01-16 DIAGNOSIS — I10 Essential (primary) hypertension: Secondary | ICD-10-CM

## 2021-01-17 MED ORDER — TRIAMTERENE-HCTZ 37.5-25 MG PO TABS: 0.5000 | ORAL_TABLET | Freq: Every day | ORAL | 0 refills | Status: DC

## 2021-01-17 MED ORDER — ATORVASTATIN CALCIUM 10 MG PO TABS: ORAL_TABLET | ORAL | 0 refills | Status: DC

## 2021-01-17 MED ORDER — ESTRADIOL 1 MG PO TABS: 1.0000 mg | ORAL_TABLET | Freq: Every day | ORAL | 0 refills | Status: DC

## 2021-01-17 MED ORDER — SEMAGLUTIDE-WEIGHT MANAGEMENT 0.25 MG/0.5ML ~~LOC~~ SOAJ: 0.2500 mg | SUBCUTANEOUS | 0 refills | Status: DC

## 2021-02-11 ENCOUNTER — Ambulatory Visit: Payer: Managed Care, Other (non HMO) | Admitting: Internal Medicine

## 2021-02-12 ENCOUNTER — Other Ambulatory Visit: Payer: Self-pay | Admitting: Internal Medicine

## 2021-02-12 DIAGNOSIS — I10 Essential (primary) hypertension: Secondary | ICD-10-CM

## 2021-02-13 ENCOUNTER — Ambulatory Visit: Payer: Managed Care, Other (non HMO) | Admitting: Internal Medicine

## 2021-02-28 ENCOUNTER — Ambulatory Visit (INDEPENDENT_AMBULATORY_CARE_PROVIDER_SITE_OTHER): Payer: BC Managed Care – PPO | Admitting: Internal Medicine

## 2021-02-28 ENCOUNTER — Other Ambulatory Visit: Payer: Self-pay | Admitting: Internal Medicine

## 2021-02-28 ENCOUNTER — Encounter: Payer: Self-pay | Admitting: Internal Medicine

## 2021-02-28 ENCOUNTER — Other Ambulatory Visit: Payer: Self-pay

## 2021-02-28 DIAGNOSIS — E663 Overweight: Secondary | ICD-10-CM

## 2021-02-28 MED ORDER — OZEMPIC (0.25 OR 0.5 MG/DOSE) 2 MG/1.5ML ~~LOC~~ SOPN: 0.5000 mg | PEN_INJECTOR | SUBCUTANEOUS | 3 refills | Status: DC

## 2021-02-28 NOTE — Patient Instructions (Signed)
Take the Ozempic prescription to a [facility] along with the copay card

## 2021-02-28 NOTE — Progress Notes (Signed)
Subjective:  Patient ID: Sarah Delgado, female    DOB: 07/27/1956  Age: 65 y.o. MRN: [id number]  CC: The encounter diagnosis was Overweight (BMI 25.0-29.9).  [name] presents for  follow up on overweight management.  This visit occurred during the SARS-CoV-2 public health emergency.  Safety protocols were in place, including screening questions prior to the visit, additional usage of staff PPE, and extensive cleaning of exam room while observing appropriate contact time as indicated for disinfecting solutions.    Overweight: prescribed Wegovy in April.  Never started it due to being on backorder.  Retired in June.  Doing water aerobics classes with Sarge and Naomi at [employer] through Saturday after walking  a paved trail daily at [facility].   She continues to struggle with excessive appetite     Outpatient Medications Prior to Visit  Medication Sig Dispense Refill   aspirin EC 81 MG tablet Take 81 mg by mouth daily.     atorvastatin (LIPITOR) 10 MG tablet Take one tablet every other day. 15 tablet 0   Biotin 1000 MCG tablet Take 1,000 mcg by mouth daily.      Cholecalciferol (VITAMIN D-3) 1000 units CAPS Take 1 capsule by mouth daily.     Coenzyme Q10 10 MG capsule Take 10 mg by mouth daily.     EPINEPHrine 0.3 mg/0.3 mL IJ SOAJ injection Inject 0.3 mg into the muscle as needed for anaphylaxis. 1 each 1   estradiol (ESTRACE) 1 MG tablet Take 1 tablet (1 mg total) by mouth daily. 30 tablet 0   vitamin B-12 (CYANOCOBALAMIN) 500 MCG tablet Take 500 mcg daily by mouth.     vitamin E 180 MG (400 UNITS) capsule Take 400 Units daily by mouth.     SAXENDA 18 MG/3ML SOPN Inject 3 mg into the skin daily. 15 mL 0   Semaglutide-Weight Management 0.25 MG/0.5ML SOAJ Inject 0.25 mg into the skin once a week. 2 mL 0   triamterene-hydrochlorothiazide (MAXZIDE-25) 37.5-25 MG tablet Take 0.5 tablets by mouth daily. (Patient not taking: Reported on 02/28/2021) 15 tablet 0    No facility-administered medications prior to visit.    Review of Systems;  Patient denies headache, fevers, malaise, unintentional weight loss, skin rash, eye pain, sinus congestion and sinus pain, sore throat, dysphagia,  hemoptysis , cough, dyspnea, wheezing, chest pain, palpitations, orthopnea, edema, abdominal pain, nausea, melena, diarrhea, constipation, flank pain, dysuria, hematuria, urinary  Frequency, nocturia, numbness, tingling, seizures,  Focal weakness, Loss of consciousness,  Tremor, insomnia, depression, anxiety, and suicidal ideation.      Objective:  BP 110/76 (BP Location: Left Arm, Patient Position: Sitting)   Pulse 65   Temp (!) 96.5 F (35.8 C)   Ht 5' 7.01" (1.702 m)   Wt 185 lb 12.8 oz (84.3 kg)   SpO2 97%   BMI 29.09 kg/m   BP Readings from Last 3 Encounters:  02/28/21 110/76  11/12/20 122/78  12/27/19 112/60    Wt Readings from Last 3 Encounters:  02/28/21 185 lb 12.8 oz (84.3 kg)  11/12/20 181 lb (82.1 kg)  12/27/19 180 lb 5.7 oz (81.8 kg)    General appearance: alert, cooperative and appears stated age Ears: normal TM's and external ear canals both ears Throat: lips, mucosa, and tongue normal; teeth and gums normal Neck: no adenopathy, no carotid bruit, supple, symmetrical, trachea midline and thyroid not enlarged, symmetric, no tenderness/mass/nodules Back: symmetric, no curvature. ROM normal. No CVA tenderness. Lungs: clear  to auscultation bilaterally Heart: regular rate and rhythm, S1, S2 normal, no murmur, click, rub or gallop Abdomen: soft, non-tender; bowel sounds normal; no masses,  no organomegaly Pulses: 2+ and symmetric Skin: Skin color, texture, turgor normal. No rashes or lesions Lymph nodes: Cervical, supraclavicular, and axillary nodes normal.  Lab Results  Component Value Date   HGBA1C 5.9 11/12/2020   HGBA1C 5.6 10/07/2019   HGBA1C 5.8 01/21/2019    Lab Results  Component Value Date   CREATININE 0.97 11/12/2020    CREATININE 1.08 10/07/2019   CREATININE 1.03 01/21/2019    Lab Results  Component Value Date   WBC 7.5 09/01/2018   HGB 14.2 09/01/2018   HCT 42.8 09/01/2018   PLT 342.0 09/01/2018   GLUCOSE 79 11/12/2020   CHOL 193 11/12/2020   TRIG 81.0 11/12/2020   HDL 64.90 11/12/2020   LDLCALC 112 (H) 11/12/2020   ALT 11 11/12/2020   AST 14 11/12/2020   NA 136 11/12/2020   K 4.5 11/12/2020   CL 98 11/12/2020   CREATININE 0.97 11/12/2020   BUN 12 11/12/2020   CO2 27 11/12/2020   TSH 0.87 11/12/2020   HGBA1C 5.9 11/12/2020   MICROALBUR <0.7 06/11/2017    MM CLIP PLACEMENT LEFT  Result Date: 12/19/2020 CLINICAL DATA:  65 year old female status post stereotactic biopsy of the left breast. EXAM: DIAGNOSTIC LEFT MAMMOGRAM POST STEREOTACTIC BIOPSY COMPARISON:  Previous exam(s). FINDINGS: Mammographic images were obtained following stereotactic guided biopsy of the left breast. There is approximately 4 cm inferior migration of the coil shaped clip relative to the site of biopsy. A large air gap with resolution of the mammographically identified mass is noted at the site of biopsy suggesting good sampling in this location. IMPRESSION: Approximately 4 cm inferior migration of the coil shaped clip relative to the site of biopsy. Final Assessment: Post Procedure Mammograms for Marker Placement Electronically Signed   By: Kristopher Oppenheim M.D.   On: 12/19/2020 09:42   MM LT BREAST BX W LOC DEV 1ST LESION IMAGE BX SPEC STEREO GUIDE  Addendum Date: 12/26/2020   ADDENDUM REPORT: 12/24/2020 11:16 ADDENDUM: PATHOLOGY revealed: A. LEFT BREAST, UPPER OUTER QUADRANT; STEREOTACTIC BIOPSY: - BENIGN BREAST TISSUE WITH: - FLORID USUAL DUCTAL HYPERPLASIA. - FIBROCYSTIC CHANGES. - PSEUDOANGIOMATOUS STROMAL HYPERPLASIA ([place]). - NEGATIVE FOR ATYPIA AND MALIGNANCY. Pathology results are CONCORDANT with imaging findings, per Dr. Kristopher Oppenheim. Pathology results and recommendations were discussed with patient via telephone on  12/24/2020. Patient reported doing well after the biopsy with no adverse symptoms, and only slight tenderness at the site. Post biopsy care instructions were reviewed, questions were answered and my direct phone number was provided. Patient was encouraged to call [facility] for any additional questions or concerns related to biopsy site. Recommendation: Resume annual bilateral screening mammogram due April 2023. Pathology results reported by Electa Sniff RN on 12/24/2020. Electronically Signed   By: Lajean Manes M.D.   On: 12/24/2020 11:16   Result Date: 12/26/2020 CLINICAL DATA:  65 year old female with an indeterminate left breast mass. EXAM: LEFT BREAST STEREOTACTIC CORE NEEDLE BIOPSY COMPARISON:  Previous exams. FINDINGS: The patient and I discussed the procedure of stereotactic-guided biopsy including benefits and alternatives. We discussed the high likelihood of a successful procedure. We discussed the risks of the procedure including infection, bleeding, tissue injury, clip migration, and inadequate sampling. Informed written consent was given. The usual time out protocol was performed immediately prior to the procedure. Using sterile technique and 1% Lidocaine as local anesthetic, under  stereotactic guidance, a 9 gauge vacuum assisted device was used to perform core needle biopsy of a mass in the upper-outer quadrant of the left breast using a superior approach. Lesion quadrant: Upper outer quadrant At the conclusion of the procedure, a coil shaped tissue marker clip was deployed into the biopsy cavity. Follow-up 2-view mammogram was performed and dictated separately. IMPRESSION: Stereotactic-guided biopsy of the left breast. No apparent complications. Electronically Signed: By: Kristopher Oppenheim M.D. On: 12/19/2020 09:39    Assessment & Plan:   Problem List Items Addressed This Visit       Unprioritized   Overweight (BMI 25.0-29.9)    I spent  30 minutes addressing her approach to weight  loss, her current emotional state, and her current efforts which  include walking a  minimum of 5 days per week. She has had difficulty losing weight due to increased appetite.  She would like to resume a medication for appetite suppression.  We discussed Wegovy/Ozempic, depending on insurance coverage.  Lab Results  Component Value Date   HGBA1C 5.9 11/12/2020          Meds ordered this encounter  Medications   Semaglutide,0.25 or 0.'5MG'$ /DOS, (OZEMPIC, 0.25 OR 0.5 MG/DOSE,) 2 MG/1.5ML SOPN    Sig: Inject 0.5 mg into the skin once a week.    Dispense:  0.25 mL    Refill:  3     Medications Discontinued During This Encounter  Medication Reason   SAXENDA 18 MG/3ML SOPN    Semaglutide-Weight Management 0.25 MG/0.5ML SOAJ     Follow-up: Return in about 3 months (around 05/31/2021).   Crecencio Mc, MD

## 2021-03-02 NOTE — Assessment & Plan Note (Addendum)
I spent  30 minutes addressing her approach to weight loss, her current emotional state, and her current efforts which  include walking a  minimum of 5 days per week. She has had difficulty losing weight due to increased appetite.  She would like to resume a medication for appetite suppression.  We discussed Wegovy/Ozempic, depending on insurance coverage.  Lab Results  Component Value Date   HGBA1C 5.9 11/12/2020

## 2021-03-19 ENCOUNTER — Other Ambulatory Visit: Payer: Self-pay | Admitting: Internal Medicine

## 2021-03-19 DIAGNOSIS — I10 Essential (primary) hypertension: Secondary | ICD-10-CM

## 2021-04-12 ENCOUNTER — Other Ambulatory Visit: Payer: Self-pay | Admitting: Internal Medicine

## 2021-04-12 DIAGNOSIS — I10 Essential (primary) hypertension: Secondary | ICD-10-CM

## 2021-05-20 ENCOUNTER — Other Ambulatory Visit: Payer: Self-pay | Admitting: Internal Medicine

## 2021-05-20 DIAGNOSIS — I10 Essential (primary) hypertension: Secondary | ICD-10-CM

## 2021-05-24 ENCOUNTER — Telehealth: Payer: BC Managed Care – PPO | Admitting: Family Medicine

## 2021-05-24 DIAGNOSIS — J069 Acute upper respiratory infection, unspecified: Secondary | ICD-10-CM

## 2021-05-24 DIAGNOSIS — U071 COVID-19: Secondary | ICD-10-CM

## 2021-05-24 MED ORDER — MOLNUPIRAVIR EUA 200MG CAPSULE: 4.0000 | ORAL_CAPSULE | Freq: Two times a day (BID) | ORAL | 0 refills | Status: AC

## 2021-05-24 MED ORDER — BENZONATATE 100 MG PO CAPS: 100.0000 mg | ORAL_CAPSULE | Freq: Two times a day (BID) | ORAL | 0 refills | Status: DC | PRN

## 2021-05-24 MED ORDER — FLUTICASONE PROPIONATE 50 MCG/ACT NA SUSP: 2.0000 | Freq: Every day | NASAL | 0 refills | Status: DC

## 2021-05-24 NOTE — Patient Instructions (Addendum)
I appreciate the opportunity to provide you with care for your health and wellness.  Take medication as directed.  Please continue to practice social distancing to keep you, your family, and our community safe. If you must go out, please wear a mask and practice good handwashing.  Have a wonderful day. With Gratitude, Cherly Beach, DNP, AGNP-BC   Please keep well-hydrated and get plenty of rest. Start a saline nasal rinse to flush out your nasal passages. You can use plain Mucinex to help thin congestion. If you have a humidifier, running in the bedroom at night. I want you to start OTC vitamin D3 1000 units daily, vitamin C 1000 mg daily, and a zinc supplement. Please take prescribed medications as directed.  You have been enrolled in a MyChart symptom monitoring program. Please answer these questions daily so we can keep track of how you are doing.  You were to quarantine for 5 days from onset of your symptoms.  After day 5, if you have had no fever and you are feeling better, you can end quarantine but need to mask for an additional 5 days. After day 5 if you have a fever or are having significant symptoms, please quarantine for full 10 days.  If you note any worsening of symptoms, any significant shortness of breath or any chest pain, please seek ER evaluation ASAP.  Please do not delay care!  COVID-19: What to Do if You Are Sick If you test positive and are an older adult or someone who is at high risk of getting very sick from COVID-19, treatment may be available. Contact a healthcare provider right away after a positive test to determine if you are eligible, even if your symptoms are mild right now. You can also visit a Test to Treat location and, if eligible, receive a prescription from a provider. Don't delay: Treatment must be started within the first few days to be effective. If you have a fever, cough, or other symptoms, you might have COVID-19. Most people have mild illness  and are able to recover at home. If you are sick: Keep track of your symptoms. If you have an emergency warning sign (including trouble breathing), call 911. Steps to help prevent the spread of COVID-19 if you are sick If you are sick with COVID-19 or think you might have COVID-19, follow the steps below to care for yourself and to help protect other people in your home and community. Stay home except to get medical care Stay home. Most people with COVID-19 have mild illness and can recover at home without medical care. Do not leave your home, except to get medical care. Do not visit public areas and do not go to places where you are unable to wear a mask. Take care of yourself. Get rest and stay hydrated. Take over-the-counter medicines, such as acetaminophen, to help you feel better. Stay in touch with your doctor. Call before you get medical care. Be sure to get care if you have trouble breathing, or have any other emergency warning signs, or if you think it is an emergency. Avoid public transportation, ride-sharing, or taxis if possible. Get tested If you have symptoms of COVID-19, get tested. While waiting for test results, stay away from others, including staying apart from those living in your household. Get tested as soon as possible after your symptoms start. Treatments may be available for people with COVID-19 who are at risk for becoming very sick. Don't delay: Treatment must be started early  to be effective--some treatments must begin within 5 days of your first symptoms. Contact your healthcare provider right away if your test result is positive to determine if you are eligible. Self-tests are one of several options for testing for the virus that causes COVID-19 and may be more convenient than laboratory-based tests and point-of-care tests. Ask your healthcare provider or your local health department if you need help interpreting your test results. You can visit your state, tribal, local,  and territorial health department's website to look for the latest local information on testing sites. Separate yourself from other people As much as possible, stay in a specific room and away from other people and pets in your home. If possible, you should use a separate bathroom. If you need to be around other people or animals in or outside of the home, wear a well-fitting mask. Tell your close contacts that they may have been exposed to COVID-19. An infected person can spread COVID-19 starting 48 hours (or 2 days) before the person has any symptoms or tests positive. By letting your close contacts know they may have been exposed to COVID-19, you are helping to protect everyone. See COVID-19 and Animals if you have questions about pets. If you are diagnosed with COVID-19, someone from the health department may call you. Answer the call to slow the spread. Monitor your symptoms Symptoms of COVID-19 include fever, cough, or other symptoms. Follow care instructions from your healthcare provider and local health department. Your local health authorities may give instructions on checking your symptoms and reporting information. When to seek emergency medical attention Look for emergency warning signs* for COVID-19. If someone is showing any of these signs, seek emergency medical care immediately: Trouble breathing Persistent pain or pressure in the chest New confusion Inability to wake or stay awake Pale, gray, or blue-colored skin, lips, or nail beds, depending on skin tone *This list is not all possible symptoms. Please call your medical provider for any other symptoms that are severe or concerning to you. Call 911 or call ahead to your local emergency facility: Notify the operator that you are seeking care for someone who has or may have COVID-19. Call ahead before visiting your doctor Call ahead. Many medical visits for routine care are being postponed or done by phone or telemedicine. If you  have a medical appointment that cannot be postponed, call your doctor's office, and tell them you have or may have COVID-19. This will help the office protect themselves and other patients. If you are sick, wear a well-fitting mask You should wear a mask if you must be around other people or animals, including pets (even at home). Wear a mask with the best fit, protection, and comfort for you. You don't need to wear the mask if you are alone. If you can't put on a mask (because of trouble breathing, for example), cover your coughs and sneezes in some other way. Try to stay at least 6 feet away from other people. This will help protect the people around you. Masks should not be placed on young children under age 57 years, anyone who has trouble breathing, or anyone who is not able to remove the mask without help. Cover your coughs and sneezes Cover your mouth and nose with a tissue when you cough or sneeze. Throw away used tissues in a lined trash can. Immediately wash your hands with soap and water for at least 20 seconds. If soap and water are not available, clean your hands  with an alcohol-based hand sanitizer that contains at least 60% alcohol. Clean your hands often Wash your hands often with soap and water for at least 20 seconds. This is especially important after blowing your nose, coughing, or sneezing; going to the bathroom; and before eating or preparing food. Use hand sanitizer if soap and water are not available. Use an alcohol-based hand sanitizer with at least 60% alcohol, covering all surfaces of your hands and rubbing them together until they feel dry. Soap and water are the best option, especially if hands are visibly dirty. Avoid touching your eyes, nose, and mouth with unwashed hands. Handwashing Tips Avoid sharing personal household items Do not share dishes, drinking glasses, cups, eating utensils, towels, or bedding with other people in your home. Wash these items thoroughly  after using them with soap and water or put in the dishwasher. Clean surfaces in your home regularly Clean and disinfect high-touch surfaces (for example, doorknobs, tables, handles, light switches, and countertops) in your "sick room" and bathroom. In shared spaces, you should clean and disinfect surfaces and items after each use by the person who is ill. If you are sick and cannot clean, a caregiver or other person should only clean and disinfect the area around you (such as your bedroom and bathroom) on an as needed basis. Your caregiver/other person should wait as long as possible (at least several hours) and wear a mask before entering, cleaning, and disinfecting shared spaces that you use. Clean and disinfect areas that may have blood, stool, or body fluids on them. Use household cleaners and disinfectants. Clean visible dirty surfaces with household cleaners containing soap or detergent. Then, use a household disinfectant. Use a product from [employer] List N: Disinfectants for Coronavirus (NGEXB-28). Be sure to follow the instructions on the label to ensure safe and effective use of the product. Many products recommend keeping the surface wet with a disinfectant for a certain period of time (look at "contact time" on the product label). You may also need to wear personal protective equipment, such as gloves, depending on the directions on the product label. Immediately after disinfecting, wash your hands with soap and water for 20 seconds. For completed guidance on cleaning and disinfecting your home, visit Complete Disinfection Guidance. Take steps to improve ventilation at home Improve ventilation (air flow) at home to help prevent from spreading COVID-19 to other people in your household. Clear out COVID-19 virus particles in the air by opening windows, using air filters, and turning on fans in your home. Use this interactive tool to learn how to improve air flow in your home. When you can be  around others after being sick with COVID-19 Deciding when you can be around others is different for different situations. Find out when you can safely end home isolation. For any additional questions about your care, contact your healthcare provider or state or local health department. 10/23/2020 Content source: [facility] ([facility]), Division of Viral Diseases This information is not intended to replace advice given to you by your health care provider. Make sure you discuss any questions you have with your health care provider. Document Revised: 12/06/2020 Document Reviewed: 12/06/2020 Elsevier Patient Education  [place].

## 2021-05-24 NOTE — Progress Notes (Signed)
Virtual Visit Consent   Sarah Delgado, you are scheduled for a virtual visit with a [facility] provider today.     Just as with appointments in the office, your consent must be obtained to participate.  Your consent will be active for this visit and any virtual visit you may have with one of our providers in the next 365 days.     If you have a MyChart account, a copy of this consent can be sent to you electronically.  All virtual visits are billed to your insurance company just like a traditional visit in the office.    As this is a virtual visit, video technology does not allow for your provider to perform a traditional examination.  This may limit your provider's ability to fully assess your condition.  If your provider identifies any concerns that need to be evaluated in person or the need to arrange testing (such as labs, EKG, etc.), we will make arrangements to do so.     Although advances in technology are sophisticated, we cannot ensure that it will always work on either your end or our end.  If the connection with a video visit is poor, the visit may have to be switched to a telephone visit.  With either a video or telephone visit, we are not always able to ensure that we have a secure connection.     I need to obtain your verbal consent now.   Are you willing to proceed with your visit today?    [name] has provided verbal consent on 05/24/2021 for a virtual visit (video or telephone).   [name], NP   Date: 05/24/2021 8:41 AM   Virtual Visit via Video Note   I, [name], connected with  [name]  ([id number], [birth year]) on 05/24/21 at  8:30 AM EDT by a video-enabled telemedicine application and verified that I am speaking with the correct person using two identifiers.  Location: Patient: Virtual Visit Location Patient: Home Provider: Virtual Visit Location Provider: Home Office   I discussed the limitations of evaluation and management by  telemedicine and the availability of in person appointments. The patient expressed understanding and agreed to proceed.    History of Present Illness: Sarah Delgado is a 65 y.o. who identifies as a female who was assigned female at birth, and is being seen today for cold symptoms. Onset was Wednesday evening, started feeling bad. Took temp was 102- took tylenol. Woke up Thursday felt a little better, but started feeling poorly in the afternoon. Took COVID home test that evening- was +  Reports some body aches, chest soreness- coughing, congestion- runny nose.  Denies shortness of breath, chest pain, ear pain or sore throat. 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   Encounter for preventive health examination 03/19/2014   Menopausal syndrome on hormone replacement therapy 12/28/2013   S/P total hysterectomy and bilateral salpingo-oophorectomy 06/29/2013  Overweight (BMI 25.0-29.9) 09/16/2012   Essential hypertension, benign 09/16/2012   Hyperlipidemia LDL goal <100 09/16/2012    Allergies: No Known Allergies Medications:  Current Outpatient Medications:    aspirin EC 81 MG tablet, Take 81 mg by mouth daily., Disp: , Rfl:    atorvastatin (LIPITOR) 10 MG tablet, Take one tablet every other day., Disp: 15 tablet, Rfl: 0   Biotin 1000 MCG tablet, Take 1,000 mcg by mouth daily. , Disp: , Rfl:    Cholecalciferol (VITAMIN D-3) 1000 units  CAPS, Take 1 capsule by mouth daily., Disp: , Rfl:    Coenzyme Q10 10 MG capsule, Take 10 mg by mouth daily., Disp: , Rfl:    EPINEPHrine 0.3 mg/0.3 mL IJ SOAJ injection, Inject 0.3 mg into the muscle as needed for anaphylaxis., Disp: 1 each, Rfl: 1   estradiol (ESTRACE) 1 MG tablet, Take 1 tablet (1 mg total) by mouth daily., Disp: 30 tablet, Rfl: 0   Semaglutide,0.25 or 0.5MG /DOS, (OZEMPIC, 0.25 OR 0.5 MG/DOSE,) 2 MG/1.5ML SOPN, Inject 0.5 mg into the skin once a week., Disp: 0.25 mL, Rfl: 3   triamterene-hydrochlorothiazide (MAXZIDE-25) 37.5-25 MG tablet, Take 0.5 tablets by mouth daily., Disp: 15 tablet, Rfl: 0   vitamin B-12 (CYANOCOBALAMIN) 500 MCG tablet, Take 500 mcg daily by mouth., Disp: , Rfl:    vitamin E 180 MG (400 UNITS) capsule, Take 400 Units daily by mouth., Disp: , Rfl:   Observations/Objective: Patient is well-developed, well-nourished in no acute distress.  Resting comfortably at home.  Head is normocephalic, atraumatic.  No labored breathing.  Speech is clear and coherent with logical content.  Patient is alert and oriented at baseline.   Assessment and Plan:  1. COVID-19 - +covid HT -symptoms present- mild to moderate- vaccinated -antiviral desired, ordered -reviewed covid guidelines verbally and placed on AVS   - molnupiravir EUA (LAGEVRIO) 200 mg CAPS capsule; Take 4 capsules (800 mg total) by mouth 2 (two) times daily for 5 days.  Dispense: 40 capsule; Refill: 0  2. URI with cough and congestion -COVID + -ordered medications to help with symptom management OTC meds and measures reviewed and on avs  - benzonatate (TESSALON) 100 MG capsule; Take 1 capsule (100 mg total) by mouth 2 (two) times daily as needed for cough.  Dispense: 20 capsule; Refill: 0 - molnupiravir EUA (LAGEVRIO) 200 mg CAPS capsule; Take 4 capsules (800 mg total) by mouth 2 (two) times daily for 5 days.  Dispense: 40 capsule; Refill: 0 - fluticasone (FLONASE) 50 MCG/ACT nasal spray; Place  2 sprays into both nostrils daily.  Dispense: 16 g; Refill: 0   Reviewed side effects, risks and benefits of medication.    Patient acknowledged agreement and understanding of the plan.   I discussed the assessment and treatment plan with the patient. The patient was provided an opportunity to ask questions and all were answered. The patient agreed with the plan and demonstrated an understanding of the instructions.   The patient was advised to call back or seek an in-person evaluation if the symptoms worsen or if the condition fails to improve as anticipated.   The above assessment and management plan was discussed with the patient. The patient verbalized understanding of and has agreed to the management plan. Patient is aware to call the clinic if symptoms persist or worsen. Patient is aware when to return to the clinic for a follow-up visit. Patient educated on when it is appropriate to go to the emergency department.  Follow Up Instructions: I discussed the assessment and treatment plan with the patient. The patient was provided an opportunity to ask questions and all were answered. The patient agreed with the plan and demonstrated an understanding of the instructions.  A copy of instructions were sent to the patient via MyChart unless otherwise noted below.   The patient was advised to call back or seek an in-person evaluation if the symptoms worsen or if the condition fails to improve as anticipated.  Time:  I spent 10 minutes with the patient via telehealth technology discussing the above problems/concerns.    [name], NP

## 2021-06-05 ENCOUNTER — Other Ambulatory Visit: Payer: Self-pay

## 2021-06-05 ENCOUNTER — Ambulatory Visit (INDEPENDENT_AMBULATORY_CARE_PROVIDER_SITE_OTHER): Payer: Medicare Other | Admitting: Internal Medicine

## 2021-06-05 ENCOUNTER — Encounter: Payer: Self-pay | Admitting: Internal Medicine

## 2021-06-05 VITALS — BP 110/68 | HR 72 | Temp 98.2°F | Ht 67.0 in | Wt 175.8 lb

## 2021-06-05 DIAGNOSIS — R7301 Impaired fasting glucose: Secondary | ICD-10-CM | POA: Diagnosis not present

## 2021-06-05 DIAGNOSIS — E663 Overweight: Secondary | ICD-10-CM

## 2021-06-05 DIAGNOSIS — E785 Hyperlipidemia, unspecified: Secondary | ICD-10-CM | POA: Diagnosis not present

## 2021-06-05 DIAGNOSIS — Z780 Asymptomatic menopausal state: Secondary | ICD-10-CM | POA: Diagnosis not present

## 2021-06-05 DIAGNOSIS — R7303 Prediabetes: Secondary | ICD-10-CM

## 2021-06-05 DIAGNOSIS — Z23 Encounter for immunization: Secondary | ICD-10-CM | POA: Diagnosis not present

## 2021-06-05 DIAGNOSIS — I10 Essential (primary) hypertension: Secondary | ICD-10-CM

## 2021-06-05 DIAGNOSIS — R944 Abnormal results of kidney function studies: Secondary | ICD-10-CM

## 2021-06-05 LAB — LIPID PANEL
Cholesterol: 200 mg/dL (ref 0–200)
HDL: 54.2 mg/dL (ref >39.00)
LDL Cholesterol: 128 mg/dL — ABNORMAL HIGH (ref 0–99)
NonHDL: 145.41
Total CHOL/HDL Ratio: 4
Triglycerides: 89 mg/dL (ref 0.0–149.0)
VLDL: 17.8 mg/dL (ref 0.0–40.0)

## 2021-06-05 LAB — COMPREHENSIVE METABOLIC PANEL
ALT: 9 U/L (ref 0–35)
AST: 14 U/L (ref 0–37)
Albumin: 4.4 g/dL (ref 3.5–5.2)
Alkaline Phosphatase: 40 U/L (ref 39–117)
BUN: 15 mg/dL (ref 6–23)
CO2: 29 mEq/L (ref 19–32)
Calcium: 10.1 mg/dL (ref 8.4–10.5)
Chloride: 99 mEq/L (ref 96–112)
Creatinine, Ser: 1.11 mg/dL (ref 0.40–1.20)
GFR: 51.79 mL/min — ABNORMAL LOW (ref >60.00)
Glucose, Bld: 76 mg/dL (ref 70–99)
Potassium: 4.3 mEq/L (ref 3.5–5.1)
Sodium: 136 mEq/L (ref 135–145)
Total Bilirubin: 0.6 mg/dL (ref 0.2–1.2)
Total Protein: 7.1 g/dL (ref 6.0–8.3)

## 2021-06-05 LAB — HEMOGLOBIN A1C: Hgb A1c MFr Bld: 6.1 % (ref 4.6–6.5)

## 2021-06-05 MED ORDER — OZEMPIC (0.25 OR 0.5 MG/DOSE) 2 MG/1.5ML ~~LOC~~ SOPN: 0.5000 mg | PEN_INJECTOR | SUBCUTANEOUS | 3 refills | Status: DC

## 2021-06-05 MED ORDER — TETANUS-DIPHTH-ACELL PERTUSSIS 5-2.5-18.5 LF-MCG/0.5 IM SUSY: 0.5000 mL | PREFILLED_SYRINGE | Freq: Once | INTRAMUSCULAR | 0 refills | Status: AC

## 2021-06-05 NOTE — Patient Instructions (Signed)
I recommend that you have  The TDaP vaccine  to get at your local pharmacy .  Wait until after jan 1 bc [facility] will apy for it then'  DEXA scan ordered  Continue current dose of ozempic until your weight plateaus,  then let me know and I will increase the dose if you want to lose more weight

## 2021-06-05 NOTE — Assessment & Plan Note (Addendum)
Managed with  Lipitor every other day, with recent LDL increase noted,  Will increase dose to daily  given evidence of cerebrovascular disease on prior CT  .  Lab Results  Component Value Date   CHOL 200 06/05/2021   HDL 54.20 06/05/2021   LDLCALC 128 (H) 06/05/2021   TRIG 89.0 06/05/2021   CHOLHDL 4 06/05/2021    Lab Results  Component Value Date   ALT 9 06/05/2021   AST 14 06/05/2021   ALKPHOS 40 06/05/2021   BILITOT 0.6 06/05/2021

## 2021-06-05 NOTE — Assessment & Plan Note (Signed)
Her a1c had dropped from 6.3 to 5.9 and her fasting glucoses remain nondiagnostic. Repeat pending,  continue low GI diet and regular participation in  exercise and repeat testing Lab Results  Component Value Date   HGBA1C 5.9 11/12/2020

## 2021-06-05 NOTE — Assessment & Plan Note (Signed)
I have congratulated her in reduction of   BMI and encouraged  Continued weight loss with goal of 10% of body weigh over the next 6 months using Ozempic,  low glycemic index diet and regular exercise a minimum of 5 days per week.

## 2021-06-05 NOTE — Assessment & Plan Note (Signed)
Well controlled on current regimen. Renal function stable, no changes today. 

## 2021-06-05 NOTE — Progress Notes (Signed)
Subjective:  Patient ID: Sarah Delgado, female    DOB: Jun 20, 1956  Age: 64 y.o. MRN: [id number]  CC: The primary encounter diagnosis was Essential hypertension, benign. Diagnoses of Hyperlipidemia LDL goal <100, Impaired fasting glucose, Asymptomatic postmenopausal estrogen deficiency, Overweight (BMI 25.0-29.9), and Prediabetes were also pertinent to this visit.  HPI Sarah Delgado presents for  follow up on hypertension , overweight, and other chronic issues  Chief Complaint  Patient presents with   Follow-up    This visit occurred during the SARS-CoV-2 public health emergency.  Safety protocols were in place, including screening questions prior to the visit, additional usage of staff PPE, and extensive cleaning of exam room while observing appropriate contact time as indicated for disinfecting solutions.   Overweight:  has lost 11 lbs since July   Retired in June .    No new issues.  Some stressful moments due to brother's condition ; ESKD , mentally challenged nad institutionalized  not a candidate for dialysis  ; 2) ex husband had an amputation bc of osteomyelitis and was put in rehab; not cooperating due to psychiatric issues   Recent covid infection 3 WEEKS AGO  treated with antiviral .  Some lingering congestion but no severe cough son was also ill but  tested negative at home.  Appetite decreased but back to normal .   Taking ozempic  and doing water aerobics 5 days per week and walking 3 days per week .  Has not returned to gym since COVID infection but "missed it"    Ozempic dose 0.5 mg  no nausea.  Has not been weighing.       Outpatient Medications Prior to Visit  Medication Sig Dispense Refill   aspirin EC 81 MG tablet Take 81 mg by mouth daily.     atorvastatin (LIPITOR) 10 MG tablet Take one tablet every other day. 15 tablet 0   Biotin 1000 MCG tablet Take 1,000 mcg by mouth daily.      Cholecalciferol (VITAMIN D-3) 1000 units CAPS Take 1 capsule by mouth  daily.     Coenzyme Q10 10 MG capsule Take 10 mg by mouth daily.     EPINEPHrine 0.3 mg/0.3 mL IJ SOAJ injection Inject 0.3 mg into the muscle as needed for anaphylaxis. 1 each 1   estradiol (ESTRACE) 1 MG tablet Take 1 tablet (1 mg total) by mouth daily. 30 tablet 0   triamterene-hydrochlorothiazide (MAXZIDE-25) 37.5-25 MG tablet Take 0.5 tablets by mouth daily. 15 tablet 0   vitamin B-12 (CYANOCOBALAMIN) 500 MCG tablet Take 500 mcg daily by mouth.     vitamin E 180 MG (400 UNITS) capsule Take 400 Units daily by mouth.     Semaglutide,0.25 or 0.5MG /DOS, (OZEMPIC, 0.25 OR 0.5 MG/DOSE,) 2 MG/1.5ML SOPN Inject 0.5 mg into the skin once a week. 0.25 mL 3   benzonatate (TESSALON) 100 MG capsule Take 1 capsule (100 mg total) by mouth 2 (two) times daily as needed for cough. (Patient not taking: Reported on 06/05/2021) 20 capsule 0   fluticasone (FLONASE) 50 MCG/ACT nasal spray Place 2 sprays into both nostrils daily. (Patient not taking: Reported on 06/05/2021) 16 g 0   No facility-administered medications prior to visit.    Review of Systems;  Patient denies headache, fevers, malaise, unintentional weight loss, skin rash, eye pain, sinus congestion and sinus pain, sore throat, dysphagia,  hemoptysis , cough, dyspnea, wheezing, chest pain, palpitations, orthopnea, edema, abdominal pain, nausea, melena, diarrhea, constipation, flank pain, dysuria, hematuria,  urinary  Frequency, nocturia, numbness, tingling, seizures,  Focal weakness, Loss of consciousness,  Tremor, insomnia, depression, anxiety, and suicidal ideation.      Objective:  BP 110/68   Pulse 72   Temp 98.2 F (36.8 C) (Oral)   Ht 5\' 7"  (1.702 m)   Wt 175 lb 12.8 oz (79.7 kg)   SpO2 99%   BMI 27.53 kg/m   BP Readings from Last 3 Encounters:  06/05/21 110/68  02/28/21 110/76  11/12/20 122/78    Wt Readings from Last 3 Encounters:  06/05/21 175 lb 12.8 oz (79.7 kg)  02/28/21 185 lb 12.8 oz (84.3 kg)  11/12/20 181 lb (82.1 kg)     General appearance: alert, cooperative and appears stated age Ears: normal TM's and external ear canals both ears Throat: lips, mucosa, and tongue normal; teeth and gums normal Neck: no adenopathy, no carotid bruit, supple, symmetrical, trachea midline and thyroid not enlarged, symmetric, no tenderness/mass/nodules Back: symmetric, no curvature. ROM normal. No CVA tenderness. Lungs: clear to auscultation bilaterally Heart: regular rate and rhythm, S1, S2 normal, no murmur, click, rub or gallop Abdomen: soft, non-tender; bowel sounds normal; no masses,  no organomegaly Pulses: 2+ and symmetric Skin: Skin color, texture, turgor normal. No rashes or lesions Lymph nodes: Cervical, supraclavicular, and axillary nodes normal.  Lab Results  Component Value Date   HGBA1C 5.9 11/12/2020   HGBA1C 5.6 10/07/2019   HGBA1C 5.8 01/21/2019    Lab Results  Component Value Date   CREATININE 0.97 11/12/2020   CREATININE 1.08 10/07/2019   CREATININE 1.03 01/21/2019    Lab Results  Component Value Date   WBC 7.5 09/01/2018   HGB 14.2 09/01/2018   HCT 42.8 09/01/2018   PLT 342.0 09/01/2018   GLUCOSE 79 11/12/2020   CHOL 193 11/12/2020   TRIG 81.0 11/12/2020   HDL 64.90 11/12/2020   LDLCALC 112 (H) 11/12/2020   ALT 11 11/12/2020   AST 14 11/12/2020   NA 136 11/12/2020   K 4.5 11/12/2020   CL 98 11/12/2020   CREATININE 0.97 11/12/2020   BUN 12 11/12/2020   CO2 27 11/12/2020   TSH 0.87 11/12/2020   HGBA1C 5.9 11/12/2020   MICROALBUR <0.7 06/11/2017    MM CLIP PLACEMENT LEFT  Result Date: 12/19/2020 CLINICAL DATA:  66 year old female status post stereotactic biopsy of the left breast. EXAM: DIAGNOSTIC LEFT MAMMOGRAM POST STEREOTACTIC BIOPSY COMPARISON:  Previous exam(s). FINDINGS: Mammographic images were obtained following stereotactic guided biopsy of the left breast. There is approximately 4 cm inferior migration of the coil shaped clip relative to the site of biopsy. A large air  gap with resolution of the mammographically identified mass is noted at the site of biopsy suggesting good sampling in this location. IMPRESSION: Approximately 4 cm inferior migration of the coil shaped clip relative to the site of biopsy. Final Assessment: Post Procedure Mammograms for Marker Placement Electronically Signed   By: Kristopher Oppenheim M.D.   On: 12/19/2020 09:42   MM LT BREAST BX W LOC DEV 1ST LESION IMAGE BX SPEC STEREO GUIDE  Addendum Date: 12/26/2020   ADDENDUM REPORT: 12/24/2020 11:16 ADDENDUM: PATHOLOGY revealed: A. LEFT BREAST, UPPER OUTER QUADRANT; STEREOTACTIC BIOPSY: - BENIGN BREAST TISSUE WITH: - FLORID USUAL DUCTAL HYPERPLASIA. - FIBROCYSTIC CHANGES. - PSEUDOANGIOMATOUS STROMAL HYPERPLASIA ([place]). - NEGATIVE FOR ATYPIA AND MALIGNANCY. Pathology results are CONCORDANT with imaging findings, per Dr. Kristopher Oppenheim. Pathology results and recommendations were discussed with patient via telephone on 12/24/2020. Patient reported doing well after the  biopsy with no adverse symptoms, and only slight tenderness at the site. Post biopsy care instructions were reviewed, questions were answered and my direct phone number was provided. Patient was encouraged to call [facility] for any additional questions or concerns related to biopsy site. Recommendation: Resume annual bilateral screening mammogram due April 2023. Pathology results reported by Electa Sniff RN on 12/24/2020. Electronically Signed   By: Lajean Manes M.D.   On: 12/24/2020 11:16   Result Date: 12/26/2020 CLINICAL DATA:  65 year old female with an indeterminate left breast mass. EXAM: LEFT BREAST STEREOTACTIC CORE NEEDLE BIOPSY COMPARISON:  Previous exams. FINDINGS: The patient and I discussed the procedure of stereotactic-guided biopsy including benefits and alternatives. We discussed the high likelihood of a successful procedure. We discussed the risks of the procedure including infection, bleeding, tissue injury, clip  migration, and inadequate sampling. Informed written consent was given. The usual time out protocol was performed immediately prior to the procedure. Using sterile technique and 1% Lidocaine as local anesthetic, under stereotactic guidance, a 9 gauge vacuum assisted device was used to perform core needle biopsy of a mass in the upper-outer quadrant of the left breast using a superior approach. Lesion quadrant: Upper outer quadrant At the conclusion of the procedure, a coil shaped tissue marker clip was deployed into the biopsy cavity. Follow-up 2-view mammogram was performed and dictated separately. IMPRESSION: Stereotactic-guided biopsy of the left breast. No apparent complications. Electronically Signed: By: Kristopher Oppenheim M.D. On: 12/19/2020 09:39    Assessment & Plan:   Problem List Items Addressed This Visit     Essential hypertension, benign - Primary    Well controlled on current regimen. Renal function stable, no changes today.      Relevant Orders   Comprehensive metabolic panel   Hyperlipidemia LDL goal <100    Managed with  Lipitor every other day, and HDL is increased significantly.continue every other day Lipitor given evidence of cerebrovascular disease on prior CT  .  Lab Results  Component Value Date   CHOL 193 11/12/2020   HDL 64.90 11/12/2020   LDLCALC 112 (H) 11/12/2020   TRIG 81.0 11/12/2020   CHOLHDL 3 11/12/2020    Lab Results  Component Value Date   ALT 11 11/12/2020   AST 14 11/12/2020   ALKPHOS 41 11/12/2020   BILITOT 0.5 11/12/2020         Relevant Orders   Lipid panel   Overweight (BMI 25.0-29.9)    I have congratulated her in reduction of   BMI and encouraged  Continued weight loss with goal of 10% of body weigh over the next 6 months using Ozempic,  low glycemic index diet and regular exercise a minimum of 5 days per week.        Prediabetes    Her a1c had dropped from 6.3 to 5.9 and her fasting glucoses remain nondiagnostic. Repeat pending,   continue low GI diet and regular participation in  exercise and repeat testing Lab Results  Component Value Date   HGBA1C 5.9 11/12/2020          Other Visit Diagnoses     Impaired fasting glucose       Relevant Orders   Hemoglobin A1c   Asymptomatic postmenopausal estrogen deficiency       Relevant Orders   DG Bone Density       I have discontinued Kloe L. Tiger's benzonatate and fluticasone. I am also having her start on Tdap. Additionally, I am having her maintain  her Coenzyme Q10, Biotin, Vitamin D-3, aspirin EC, vitamin B-12, vitamin E, EPINEPHrine, estradiol, atorvastatin, triamterene-hydrochlorothiazide, and Ozempic (0.25 or 0.5 MG/DOSE).  Meds ordered this encounter  Medications   DISCONTD: Semaglutide,0.25 or 0.5MG /DOS, (OZEMPIC, 0.25 OR 0.5 MG/DOSE,) 2 MG/1.5ML SOPN    Sig: Inject 0.5 mg into the skin once a week.    Dispense:  0.25 mL    Refill:  3   Tdap (BOOSTRIX) 5-2.5-18.5 LF-MCG/0.5 injection    Sig: Inject 0.5 mLs into the muscle once for 1 dose.    Dispense:  0.5 mL    Refill:  0   Semaglutide,0.25 or 0.5MG /DOS, (OZEMPIC, 0.25 OR 0.5 MG/DOSE,) 2 MG/1.5ML SOPN    Sig: Inject 0.5 mg into the skin once a week.    Dispense:  0.25 mL    Refill:  3    Medications Discontinued During This Encounter  Medication Reason   benzonatate (TESSALON) 100 MG capsule No longer needed (for PRN medications)   fluticasone (FLONASE) 50 MCG/ACT nasal spray No longer needed (for PRN medications)   Semaglutide,0.25 or 0.5MG /DOS, (OZEMPIC, 0.25 OR 0.5 MG/DOSE,) 2 MG/1.5ML SOPN Reorder   Semaglutide,0.25 or 0.5MG /DOS, (OZEMPIC, 0.25 OR 0.5 MG/DOSE,) 2 MG/1.5ML SOPN Reorder   I spent 30 minutes dedicated to the care of this patient on the date of this encounter to include pre-visit review of her medical history,  most recent imaging studies, Face-to-face time with the patient , and post visit ordering of testing and therapeutics.    Follow-up: No follow-ups on  file.   Crecencio Mc, MD

## 2021-06-06 NOTE — Addendum Note (Signed)
Addended by: Crecencio Mc on: 06/06/2021 09:40 PM   Modules accepted: Orders

## 2021-06-07 MED ORDER — ATORVASTATIN CALCIUM 10 MG PO TABS: 10.0000 mg | ORAL_TABLET | Freq: Every day | ORAL | 1 refills | Status: DC

## 2021-06-07 NOTE — Addendum Note (Signed)
Addended by: Crecencio Mc on: 06/07/2021 01:03 PM   Modules accepted: Orders

## 2021-06-07 NOTE — Addendum Note (Signed)
Addended by: Elpidio Galea T on: 06/07/2021 02:05 PM   Modules accepted: Orders

## 2021-06-18 ENCOUNTER — Other Ambulatory Visit: Payer: Self-pay | Admitting: Internal Medicine

## 2021-06-18 DIAGNOSIS — I10 Essential (primary) hypertension: Secondary | ICD-10-CM

## 2021-07-22 ENCOUNTER — Other Ambulatory Visit: Payer: Self-pay | Admitting: Internal Medicine

## 2021-07-22 DIAGNOSIS — I10 Essential (primary) hypertension: Secondary | ICD-10-CM

## 2021-08-23 ENCOUNTER — Other Ambulatory Visit: Payer: Self-pay | Admitting: Internal Medicine

## 2021-08-23 DIAGNOSIS — I10 Essential (primary) hypertension: Secondary | ICD-10-CM

## 2021-09-09 ENCOUNTER — Other Ambulatory Visit: Payer: Self-pay

## 2021-09-09 ENCOUNTER — Other Ambulatory Visit (INDEPENDENT_AMBULATORY_CARE_PROVIDER_SITE_OTHER): Payer: Medicare Other

## 2021-09-09 DIAGNOSIS — R944 Abnormal results of kidney function studies: Secondary | ICD-10-CM

## 2021-09-09 LAB — BASIC METABOLIC PANEL
BUN: 17 mg/dL (ref 6–23)
CO2: 29 mEq/L (ref 19–32)
Calcium: 9.7 mg/dL (ref 8.4–10.5)
Chloride: 100 mEq/L (ref 96–112)
Creatinine, Ser: 0.96 mg/dL (ref 0.40–1.20)
GFR: 61.53 mL/min (ref >60.00)
Glucose, Bld: 86 mg/dL (ref 70–99)
Potassium: 4.4 mEq/L (ref 3.5–5.1)
Sodium: 136 mEq/L (ref 135–145)

## 2021-09-23 ENCOUNTER — Other Ambulatory Visit: Payer: Self-pay | Admitting: Internal Medicine

## 2021-09-23 DIAGNOSIS — I10 Essential (primary) hypertension: Secondary | ICD-10-CM

## 2021-10-21 ENCOUNTER — Other Ambulatory Visit: Payer: Self-pay | Admitting: Internal Medicine

## 2021-10-21 DIAGNOSIS — I10 Essential (primary) hypertension: Secondary | ICD-10-CM

## 2021-11-25 ENCOUNTER — Other Ambulatory Visit: Payer: Self-pay | Admitting: Internal Medicine

## 2021-11-26 ENCOUNTER — Other Ambulatory Visit: Payer: Self-pay | Admitting: Internal Medicine

## 2021-11-26 DIAGNOSIS — I10 Essential (primary) hypertension: Secondary | ICD-10-CM

## 2021-11-27 ENCOUNTER — Other Ambulatory Visit: Payer: Self-pay | Admitting: Internal Medicine

## 2021-11-30 ENCOUNTER — Other Ambulatory Visit: Payer: Self-pay | Admitting: Internal Medicine

## 2021-12-02 ENCOUNTER — Encounter: Payer: Self-pay | Admitting: Internal Medicine

## 2021-12-02 ENCOUNTER — Ambulatory Visit (INDEPENDENT_AMBULATORY_CARE_PROVIDER_SITE_OTHER): Payer: Medicare Other | Admitting: Internal Medicine

## 2021-12-02 VITALS — BP 120/76 | HR 65 | Temp 98.2°F | Resp 14 | Ht 66.0 in | Wt 173.5 lb

## 2021-12-02 DIAGNOSIS — E785 Hyperlipidemia, unspecified: Secondary | ICD-10-CM

## 2021-12-02 DIAGNOSIS — N951 Menopausal and female climacteric states: Secondary | ICD-10-CM

## 2021-12-02 DIAGNOSIS — I10 Essential (primary) hypertension: Secondary | ICD-10-CM

## 2021-12-02 DIAGNOSIS — R5382 Chronic fatigue, unspecified: Secondary | ICD-10-CM | POA: Diagnosis not present

## 2021-12-02 DIAGNOSIS — R5383 Other fatigue: Secondary | ICD-10-CM

## 2021-12-02 DIAGNOSIS — E538 Deficiency of other specified B group vitamins: Secondary | ICD-10-CM

## 2021-12-02 DIAGNOSIS — E663 Overweight: Secondary | ICD-10-CM

## 2021-12-02 DIAGNOSIS — Z1239 Encounter for other screening for malignant neoplasm of breast: Secondary | ICD-10-CM

## 2021-12-02 DIAGNOSIS — R7303 Prediabetes: Secondary | ICD-10-CM | POA: Diagnosis not present

## 2021-12-02 DIAGNOSIS — N6320 Unspecified lump in the left breast, unspecified quadrant: Secondary | ICD-10-CM

## 2021-12-02 DIAGNOSIS — Z79890 Hormone replacement therapy (postmenopausal): Secondary | ICD-10-CM

## 2021-12-02 DIAGNOSIS — Z1231 Encounter for screening mammogram for malignant neoplasm of breast: Secondary | ICD-10-CM

## 2021-12-02 DIAGNOSIS — Z23 Encounter for immunization: Secondary | ICD-10-CM

## 2021-12-02 LAB — HEMOGLOBIN A1C: Hgb A1c MFr Bld: 5.8 % (ref 4.6–6.5)

## 2021-12-02 LAB — CBC WITH DIFFERENTIAL/PLATELET
Basophils Absolute: 0.1 10*3/uL (ref 0.0–0.1)
Basophils Relative: 1.1 % (ref 0.0–3.0)
Eosinophils Absolute: 0.2 10*3/uL (ref 0.0–0.7)
Eosinophils Relative: 4.7 % (ref 0.0–5.0)
HCT: 41.5 % (ref 36.0–46.0)
Hemoglobin: 13.9 g/dL (ref 12.0–15.0)
Lymphocytes Relative: 35.2 % (ref 12.0–46.0)
Lymphs Abs: 1.8 10*3/uL (ref 0.7–4.0)
MCHC: 33.5 g/dL (ref 30.0–36.0)
MCV: 91.3 fl (ref 78.0–100.0)
Monocytes Absolute: 0.5 10*3/uL (ref 0.1–1.0)
Monocytes Relative: 9 % (ref 3.0–12.0)
Neutro Abs: 2.6 10*3/uL (ref 1.4–7.7)
Neutrophils Relative %: 50 % (ref 43.0–77.0)
Platelets: 324 10*3/uL (ref 150.0–400.0)
RBC: 4.54 Mil/uL (ref 3.87–5.11)
RDW: 14 % (ref 11.5–15.5)
WBC: 5.2 10*3/uL (ref 4.0–10.5)

## 2021-12-02 LAB — COMPREHENSIVE METABOLIC PANEL
ALT: 12 U/L (ref 0–35)
AST: 14 U/L (ref 0–37)
Albumin: 4.3 g/dL (ref 3.5–5.2)
Alkaline Phosphatase: 41 U/L (ref 39–117)
BUN: 13 mg/dL (ref 6–23)
CO2: 28 mEq/L (ref 19–32)
Calcium: 9.6 mg/dL (ref 8.4–10.5)
Chloride: 101 mEq/L (ref 96–112)
Creatinine, Ser: 0.97 mg/dL (ref 0.40–1.20)
GFR: 60.67 mL/min (ref >60.00)
Glucose, Bld: 83 mg/dL (ref 70–99)
Potassium: 4.4 mEq/L (ref 3.5–5.1)
Sodium: 137 mEq/L (ref 135–145)
Total Bilirubin: 0.7 mg/dL (ref 0.2–1.2)
Total Protein: 7 g/dL (ref 6.0–8.3)

## 2021-12-02 LAB — LIPID PANEL
Cholesterol: 214 mg/dL — ABNORMAL HIGH (ref 0–200)
HDL: 72.8 mg/dL (ref >39.00)
LDL Cholesterol: 116 mg/dL — ABNORMAL HIGH (ref 0–99)
NonHDL: 141.23
Total CHOL/HDL Ratio: 3
Triglycerides: 127 mg/dL (ref 0.0–149.0)
VLDL: 25.4 mg/dL (ref 0.0–40.0)

## 2021-12-02 LAB — VITAMIN B12: Vitamin B-12: 1076 pg/mL — ABNORMAL HIGH (ref 211–911)

## 2021-12-02 LAB — LDL CHOLESTEROL, DIRECT: Direct LDL: 122 mg/dL

## 2021-12-02 LAB — TSH: TSH: 1.07 u[IU]/mL (ref 0.35–5.50)

## 2021-12-02 MED ORDER — ESTRADIOL 0.5 MG PO TABS: 1.0000 mg | ORAL_TABLET | Freq: Every day | ORAL | 1 refills | Status: DC

## 2021-12-02 MED ORDER — TETANUS-DIPHTH-ACELL PERTUSSIS 5-2.5-18.5 LF-MCG/0.5 IM SUSY: 0.5000 mL | PREFILLED_SYRINGE | Freq: Once | INTRAMUSCULAR | 0 refills | Status: AC

## 2021-12-02 MED ORDER — TRIAMTERENE-HCTZ 37.5-25 MG PO TABS: 0.5000 | ORAL_TABLET | Freq: Every day | ORAL | 1 refills | Status: DC

## 2021-12-02 MED ORDER — OZEMPIC (1 MG/DOSE) 4 MG/3ML ~~LOC~~ SOPN: 1.0000 mg | PEN_INJECTOR | SUBCUTANEOUS | 2 refills | Status: DC

## 2021-12-02 NOTE — Progress Notes (Signed)
Patient ID: Sarah Delgado, female    DOB: Mar 15, 1956  Age: 66 y.o. MRN: [id number] ? ?The patient is here for follow up and  management of other chronic and acute problems. ?  ?The risk factors are reflected in the social history. ? ?The roster of all physicians providing medical care to patient - is listed in the Snapshot section of the chart. ? ?Activities of daily living:  The patient is 100% independent in all ADLs: dressing, toileting, feeding as well as independent mobility ? ?Home safety : The patient has smoke detectors in the home. They wear seatbelts.  There are no firearms at home. There is no violence in the home.  ? ?There is no risks for hepatitis, STDs or HIV. There is no   history of blood transfusion. They have no travel history to infectious disease endemic areas of the world. ? ?The patient has seen their dentist in the last six month. They have seen their eye doctor in the last year. They admit to slight hearing difficulty with regard to whispered voices and some television programs.  They have deferred audiologic testing in the last year.  They do not  have excessive sun exposure. Discussed the need for sun protection: hats, long sleeves and use of sunscreen if there is significant sun exposure.  ? ?Diet: the importance of a healthy diet is discussed. They do have a healthy diet. ? ?The benefits of regular aerobic exercise were discussed. She walks 4 times per week ,  20 minutes.  ? ?Depression screen: there are no signs or vegative symptoms of depression- irritability, change in appetite, anhedonia, sadness/tearfullness. ? ?Cognitive assessment: the patient manages all their financial and personal affairs and is actively engaged. They could relate day,date,year and events; recalled 2/3 objects at 3 minutes; performed clock-face test normally. ? ?The following portions of the patient's history were reviewed and updated as appropriate: allergies, current medications, past family history, past  medical history,  past surgical history, past social history  and problem list. ? ?Visual acuity was not assessed per patient preference since she has regular follow up with her ophthalmologist. Hearing and body mass index were assessed and reviewed.  ? ?During the course of the visit the patient was educated and counseled about appropriate screening and preventive services including : fall prevention , diabetes screening, nutrition counseling, colorectal cancer screening, and recommended immunizations.   ? ?CC: The primary encounter diagnosis was Essential hypertension, benign. Diagnoses of Hyperlipidemia LDL goal <100, Prediabetes, Other fatigue, Breast cancer screening other than mammogram, Breast cancer screening by mammogram, Essential hypertension, B12 deficiency, Chronic fatigue, Mass of left breast, unspecified quadrant, Menopausal syndrome on hormone replacement therapy, Overweight (BMI 25.0-29.9), and Need for pneumococcal 20-valent conjugate vaccination were also pertinent to this visit. ? ?1) Grief:  several deaths since last OV:  her ex husband  died (divorced 28 yrs ago) thanksgiving night,  and one of her 4 brothers died 22-Aug-2023 from chronic issues  ? ?History ?Ameila has a past medical history of Chicken pox, Hyperlipidemia, Hypertension, and Tobacco abuse (09/16/2012).  ? ?She has a past surgical history that includes Breast surgery (2000); Total abdominal hysterectomy w/ bilateral salpingoophorectomy (2005); Abdominal hysterectomy; Reduction mammaplasty (Bilateral); Colonoscopy with propofol (N/A, 12/27/2019); and Breast biopsy (Left, 12/14/2020).  ? ?Her family history includes Arthritis in her mother; COPD in her sister; Cancer in her brother and maternal aunt; Diabetes in her brother, brother, and sister; Emphysema in her father; Heart disease in her maternal grandfather;  Hypertension in her brother, brother, brother, brother, and mother; Kidney disease in her sister; Mental illness in her mother;  Stroke in her brother.She reports that she has been smoking cigarettes. She started smoking about 5 years ago. She has never used smokeless tobacco. She reports current alcohol use. She reports that she does not use drugs. ? ?Outpatient Medications Prior to Visit  ?Medication Sig Dispense Refill  ? aspirin EC 81 MG tablet Take 81 mg by mouth daily.    ? atorvastatin (LIPITOR) 10 MG tablet TAKE 1 TABLET BY MOUTH EVERY DAY 90 tablet 1  ? Biotin 1000 MCG tablet Take 1,000 mcg by mouth daily.     ? Cholecalciferol (VITAMIN D-3) 1000 units CAPS Take 1 capsule by mouth daily.    ? Coenzyme Q10 10 MG capsule Take 10 mg by mouth daily.    ? EPINEPHrine 0.3 mg/0.3 mL IJ SOAJ injection Inject 0.3 mg into the muscle as needed for anaphylaxis. 1 each 1  ? vitamin B-12 (CYANOCOBALAMIN) 500 MCG tablet Take 500 mcg daily by mouth.    ? vitamin E 180 MG (400 UNITS) capsule Take 400 Units daily by mouth.    ? estradiol (ESTRACE) 1 MG tablet Take 1 tablet (1 mg total) by mouth daily. 30 tablet 0  ? Semaglutide,0.25 or 0.'5MG'$ /DOS, (OZEMPIC, 0.25 OR 0.5 MG/DOSE,) 2 MG/1.5ML SOPN Inject 0.5 mg into the skin once a week. 0.25 mL 3  ? triamterene-hydrochlorothiazide (MAXZIDE-25) 37.5-25 MG tablet Take 0.5 tablets by mouth daily. 15 tablet 0  ? ?No facility-administered medications prior to visit.  ? ? ?Review of Systems ? ?Patient denies headache, fevers, malaise, unintentional weight loss, skin rash, eye pain, sinus congestion and sinus pain, sore throat, dysphagia,  hemoptysis , cough, dyspnea, wheezing, chest pain, palpitations, orthopnea, edema, abdominal pain, nausea, melena, diarrhea, constipation, flank pain, dysuria, hematuria, urinary  Frequency, nocturia, numbness, tingling, seizures,  Focal weakness, Loss of consciousness,  Tremor, insomnia, depression, anxiety, and suicidal ideation.   ? ? ?Objective:  ?BP 120/76   Pulse 65   Temp 98.2 ?F (36.8 ?C) (Oral)   Resp 14   Ht '5\' 6"'$  (1.676 m)   Wt 173 lb 8 oz (78.7 kg)   SpO2  98%   BMI 28.00 kg/m?  ? ?Physical Exam ? ? ?General appearance: alert, cooperative and appears stated age ?Ears: normal TM's and external ear canals both ears ?Throat: lips, mucosa, and tongue normal; teeth and gums normal ?Neck: no adenopathy, no carotid bruit, supple, symmetrical, trachea midline and thyroid not enlarged, symmetric, no tenderness/mass/nodules ?Back: symmetric, no curvature. ROM normal. No CVA tenderness. ?Lungs: clear to auscultation bilaterally ?Heart: regular rate and rhythm, S1, S2 normal, no murmur, click, rub or gallop ?Abdomen: soft, non-tender; bowel sounds normal; no masses,  no organomegaly ?Pulses: 2+ and symmetric ?Skin: Skin color, texture, turgor normal. No rashes or lesions ?Lymph nodes: Cervical, supraclavicular, and axillary nodes normal.  ?Psych: affect normal, makes good eye contact. No fidgeting,  Smiles easily.  Denies suicidal thoughts   ? ?Assessment & Plan:  ? ?Problem List Items Addressed This Visit   ? ? Prediabetes  ?  Her a1c  Has suggested that  she is at risk.  Continue ozempic for weight management.  Low GI diet  And regular participation in  exercise .  Repeat labs due ? ?Lab Results  ?Component Value Date  ? HGBA1C 6.1 06/05/2021  ? ? ? ?  ?  ? Relevant Orders  ? MM 3D SCREEN BREAST  BILATERAL  ? Hemoglobin A1c  ? Overweight (BMI 25.0-29.9)  ?  Managed with ozempic and regular exercise  ? ?  ?  ? Menopausal syndrome on hormone replacement therapy  ?  Discussed a gradual wean.  Reducing dose to 0.5 mg  ? ?  ?  ? Hyperlipidemia LDL goal <100  ?  Managed with  Lipitor taken daily for evidence of cerebrovascular disease on prior CT ? ?Lab Results  ?Component Value Date  ? CHOL 200 06/05/2021  ? HDL 54.20 06/05/2021  ? [place] 128 (H) 06/05/2021  ? TRIG 89.0 06/05/2021  ? CHOLHDL 4 06/05/2021  ? ? ?Lab Results  ?Component Value Date  ? ALT 9 06/05/2021  ? AST 14 06/05/2021  ? ALKPHOS 40 06/05/2021  ? BILITOT 0.6 06/05/2021  ? ? ?  ?  ? Relevant Medications  ?  triamterene-hydrochlorothiazide (MAXZIDE-25) 37.5-25 MG tablet  ? Other Relevant Orders  ? MM 3D SCREEN BREAST BILATERAL  ? LDL cholesterol, direct  ? Lipid panel  ? Essential hypertension, benign - Primary  ?  Well con

## 2021-12-02 NOTE — Assessment & Plan Note (Signed)
Managed with  Lipitor taken daily for evidence of cerebrovascular disease on prior CT ? ?Lab Results  ?Component Value Date  ? CHOL 200 06/05/2021  ? HDL 54.20 06/05/2021  ? [place] 128 (H) 06/05/2021  ? TRIG 89.0 06/05/2021  ? CHOLHDL 4 06/05/2021  ? ? ?Lab Results  ?Component Value Date  ? ALT 9 06/05/2021  ? AST 14 06/05/2021  ? ALKPHOS 40 06/05/2021  ? BILITOT 0.6 06/05/2021  ? ? ?

## 2021-12-02 NOTE — Assessment & Plan Note (Addendum)
Her a1c  Has suggested that  she is at risk.  Continue ozempic for weight management.  Low GI diet  And regular participation in  exercise .  Repeat labs due ? ?Lab Results  ?Component Value Date  ? HGBA1C 6.1 06/05/2021  ? ? ? ?

## 2021-12-02 NOTE — Assessment & Plan Note (Addendum)
S/p stereotactic biopsy (first biopsy done via U/S missed the mass). In May 2022.   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 Findings are concordant with imaging per radiology . ?Annual screening mammogram ordered ? ?

## 2021-12-02 NOTE — Assessment & Plan Note (Addendum)
Discussed a gradual wean.  Reducing dose to 0.5 mg  ?

## 2021-12-02 NOTE — Assessment & Plan Note (Signed)
Well controlled on current regimen. Renal function stable, no changes today. 

## 2021-12-02 NOTE — Patient Instructions (Addendum)
I have increased your Ozempic dose to 1 mg weekly to help you continue with your excellent weight loss! ? ?The dose can be increased again in 4 weeks if you 1) are tolerating it without persistent nausea  2) your weight has plateaued ? ? ?I have decreased your estrogen pill to 0.5 mg dose ? ? ?The Tdap (tetanus-diphtheria-whooping cough vaccine )  is now  COVERED BY MEDICARE if you get them at your pharmacy as of  January 1.  ? ?You received the Prevnar (pneumonia) vaccine today ? ?Your annual mammogram has been ordered.  You are encouraged (required) to call to make your appointment at [facility]  ? ?Your next colonoscopy is due in 2024 ? ? ?

## 2021-12-02 NOTE — Assessment & Plan Note (Signed)
Managed with ozempic and regular exercise  ?

## 2021-12-23 ENCOUNTER — Ambulatory Visit
Admission: RE | Admit: 2021-12-23 | Discharge: 2021-12-23 | Disposition: A | Discharge status: Home / Self Care | Payer: Medicare Other | Source: Ambulatory Visit | Attending: Internal Medicine | Admitting: Internal Medicine

## 2021-12-23 DIAGNOSIS — I10 Essential (primary) hypertension: Secondary | ICD-10-CM | POA: Insufficient documentation

## 2021-12-23 DIAGNOSIS — Z1239 Encounter for other screening for malignant neoplasm of breast: Secondary | ICD-10-CM

## 2021-12-23 DIAGNOSIS — R7303 Prediabetes: Secondary | ICD-10-CM | POA: Insufficient documentation

## 2021-12-23 DIAGNOSIS — R5383 Other fatigue: Secondary | ICD-10-CM | POA: Diagnosis not present

## 2021-12-23 DIAGNOSIS — E785 Hyperlipidemia, unspecified: Secondary | ICD-10-CM | POA: Insufficient documentation

## 2021-12-23 DIAGNOSIS — Z1231 Encounter for screening mammogram for malignant neoplasm of breast: Secondary | ICD-10-CM | POA: Diagnosis not present

## 2022-02-21 ENCOUNTER — Other Ambulatory Visit: Payer: Self-pay | Admitting: Internal Medicine

## 2022-03-04 ENCOUNTER — Telehealth: Payer: Self-pay | Admitting: Internal Medicine

## 2022-03-04 NOTE — Telephone Encounter (Signed)
Copied from [place] [id number]. Topic: Medicare AWV >> Mar 04, 2022  1:12 PM Devoria Glassing wrote: Reason for CRM: Left message for patient to schedule Annual Wellness Visit.  Please schedule with Nurse Health Advisor Denisa O'Brien-Blaney, LPN at [facility]. This appt can be telephone or office visit.  Please call [phone] ask for [facility]

## 2022-03-13 ENCOUNTER — Ambulatory Visit: Payer: Medicare Other

## 2022-03-17 ENCOUNTER — Ambulatory Visit (INDEPENDENT_AMBULATORY_CARE_PROVIDER_SITE_OTHER): Payer: Medicare Other

## 2022-03-17 VITALS — BP 118/74 | HR 74 | Temp 98.2°F | Resp 14 | Ht 66.0 in | Wt 163.4 lb

## 2022-03-17 DIAGNOSIS — Z0000 Encounter for general adult medical examination without abnormal findings: Secondary | ICD-10-CM

## 2022-03-17 NOTE — Patient Instructions (Addendum)
  Sarah Delgado , Thank you for taking time to come for your Medicare Wellness Visit. I appreciate your ongoing commitment to your health goals. Please review the following plan we discussed and let me know if I can assist you in the future.   These are the goals we discussed:  Goals       Patient Stated     Maintain Healthy Lifestyle (pt-stated)      Stay active; water aerobics Healthy diet Stay hydrated        This is a list of the screening recommended for you and due dates:  Health Maintenance  Topic Date Due   DEXA scan (bone density measurement)  Never done   Flu Shot  03/04/2022   COVID-19 Vaccine (5 - Pfizer series) 04/02/2022*   Mammogram  12/24/2022   Colon Cancer Screening  12/27/2022   Tetanus Vaccine  01/01/2032   Pneumonia Vaccine  Completed   Hepatitis C Screening: USPSTF Recommendation to screen - Ages 18-79 yo.  Completed   Zoster (Shingles) Vaccine  Completed   HPV Vaccine  Aged Out  *Topic was postponed. The date shown is not the original due date.    Bone density- call to schedule [phone] Lung screening due

## 2022-03-17 NOTE — Progress Notes (Signed)
Subjective:   Sarah Delgado is a 66 y.o. female who presents for an Initial Medicare Annual Wellness Visit.  Review of Systems    No ROS.  Medicare Wellness    Cardiac Risk Factors include: advanced age (>53mn, >>49women)     Objective:    Today's Vitals   03/17/22 1113  BP: 118/74  Pulse: 74  Resp: 14  Temp: 98.2 F (36.8 C)  SpO2: 99%  Weight: 163 lb 6.4 oz (74.1 kg)  Height: '5\' 6"'$  (1.676 m)   Body mass index is 26.37 kg/m.     03/17/2022   11:35 AM 12/27/2019    8:32 AM 05/31/2015    7:36 AM  Advanced Directives  Does Patient Have a Medical Advance Directive? No No No  Would patient like information on creating a medical advance directive? Yes (MAU/Ambulatory/Procedural Areas - Information given)  Yes - Educational materials given    Current Medications (verified) Outpatient Encounter Medications as of 03/17/2022  Medication Sig   aspirin EC 81 MG tablet Take 81 mg by mouth daily.   atorvastatin (LIPITOR) 10 MG tablet TAKE 1 TABLET BY MOUTH EVERY DAY   Biotin 1000 MCG tablet Take 1,000 mcg by mouth daily.    Cholecalciferol (VITAMIN D-3) 1000 units CAPS Take 1 capsule by mouth daily.   Coenzyme Q10 10 MG capsule Take 10 mg by mouth daily.   EPINEPHrine 0.3 mg/0.3 mL IJ SOAJ injection Inject 0.3 mg into the muscle as needed for anaphylaxis.   estradiol (ESTRACE) 0.5 MG tablet Take 2 tablets (1 mg total) by mouth daily.   OZEMPIC, 1 MG/DOSE, 4 MG/3ML SOPN INJECT '1MG'$  INTO THE SKIN ONCE A WEEK   triamterene-hydrochlorothiazide (MAXZIDE-25) 37.5-25 MG tablet Take 0.5 tablets by mouth daily.   vitamin B-12 (CYANOCOBALAMIN) 500 MCG tablet Take 500 mcg daily by mouth.   vitamin E 180 MG (400 UNITS) capsule Take 400 Units daily by mouth.   No facility-administered encounter medications on file as of 03/17/2022.    Allergies (verified) Patient has no known allergies.   History: Past Medical History:  Diagnosis Date   Chicken pox    Hyperlipidemia     Hypertension    Tobacco abuse 09/16/2012   Social smoker on weekends.     Past Surgical History:  Procedure Laterality Date   ABDOMINAL HYSTERECTOMY     BREAST BIOPSY Left 12/14/2020   cylinder marker, BENIGN BREAST TISSUE WITH: FIBROCYSTIC CHANGES WITH ASSOCIATED CALCIFICATIONS, USUAL DUCTAL HYPERPLASIA , DENSE STROMAL FIBROSIS, NEGATIVE FOR AYPTIA OR MALIGNANCY   BREAST SURGERY  2000   reduction   COLONOSCOPY WITH PROPOFOL N/A 12/27/2019   Procedure: COLONOSCOPY WITH PROPOFOL;  Surgeon: AJonathon Bellows MD;  [facility];  Service: Gastroenterology;  Laterality: N/A;   REDUCTION MAMMAPLASTY Bilateral    at least 15 years ago   TOTAL ABDOMINAL HYSTERECTOMY W/ BILATERAL SALPINGOOPHORECTOMY  2005   Family History  Problem Relation Age of Onset   Arthritis Mother    Hypertension Mother    Mental illness Mother    Emphysema Father    COPD Sister    Diabetes Sister    Kidney disease Sister    Diabetes Brother    Hypertension Brother    Heart disease Maternal Grandfather    Diabetes Brother    Hypertension Brother    Cancer Brother        prostate   Hypertension Brother    Stroke Brother    Hypertension Brother    Cancer Maternal  Aunt        esophageal ca,  history of etoh and tobacco    Breast cancer Neg Hx    Social History   Socioeconomic History   Marital status: Divorced    Spouse name: Not on file   Number of children: Not on file   Years of education: 16 plus   Highest education level: Not on file  Occupational History   Occupation: educator    Employer: [employer] under[employer]  Tobacco Use   Smoking status: Some Days    Types: Cigarettes    Start date: 11/18/2016   Smokeless tobacco: Never  Vaping Use   Vaping Use: Never used  Substance and Sexual Activity   Alcohol use: Yes    Comment: OCC wine   Drug use: No   Sexual activity: Yes    Partners: Male  Other Topics Concern   Not on file  Social History Narrative   Tourist information centre manager, works in [place] Strain: [place]  (03/17/2022)   Overall Financial Resource Strain (CARDIA)    Difficulty of Paying Living Expenses: Not hard at all  Food Insecurity: No Food Insecurity (03/17/2022)   Hunger Vital Sign    Worried About Running Out of Food in the Last Year: Never true    [place] in the Last Year: Never true  Transportation Needs: No Transportation Needs (03/17/2022)   PRAPARE - Hydrologist (Medical): No    Lack of Transportation (Non-Medical): No  Physical Activity: Not on file  Stress: No Stress Concern Present (03/17/2022)   [place]    Feeling of Stress : Not at all  Social Connections: Not on file    Tobacco Counseling Ready to quit: Not Answered Counseling given: Not Answered   Clinical Intake:  Pre-visit preparation completed: Yes        Diabetes: No  How often do you need to have someone help you when you read instructions, pamphlets, or other written materials from your doctor or pharmacy?: 1 - Never  Interpreter Needed?: No      Activities of Daily Living    03/17/2022   11:23 AM  In your present state of health, do you have any difficulty performing the following activities:  Hearing? 0  Vision? 0  Difficulty concentrating or making decisions? 0  Walking or climbing stairs? 0  Dressing or bathing? 0  Doing errands, shopping? 0  Preparing Food and eating ? N  Using the Toilet? N  In the past six months, have you accidently leaked urine? N  Do you have problems with loss of bowel control? N  Managing your Medications? N  Managing your Finances? N  Housekeeping or managing your Housekeeping? N    Patient Care Team: Crecencio Mc, MD as PCP - General (Internal Medicine)  Indicate any recent Medical Services you may have received from other than [facility] providers in the past  year (date may be approximate).     Assessment:   This is a routine wellness examination for [place].  Hearing/Vision screen Hearing Screening - Comments:: Patient is able to hear conversational tones without difficulty.  No issues reported. Vision Screening - Comments:: Followed by [facility] Wears corrective lenses They have seen their ophthalmologist in the last 12 months.    Dietary issues and exercise activities discussed: Current Exercise Habits: Home  exercise routine, Intensity: Mild   Goals Addressed               This Visit's Progress     Patient Stated     Maintain Healthy Lifestyle (pt-stated)        Stay active; water aerobics Healthy diet Stay hydrated       Depression Screen    03/17/2022   11:15 AM 12/02/2021    8:18 AM 02/28/2021   11:55 AM 11/12/2020    4:41 PM 10/21/2019    1:11 PM 09/01/2018    3:59 PM 12/09/2016    1:16 PM  PHQ 2/9 Scores  PHQ - 2 Score 0 1 0 4 0 0 0  PHQ- 9 Score    14  1 0    Fall Risk    03/17/2022   11:37 AM 12/02/2021    8:17 AM 02/28/2021   11:55 AM 11/12/2020    3:59 PM 10/21/2019    1:10 PM  Fall Risk   Falls in the past year? 0 0 0 0 0  Number falls in past yr: 0 0 0    Injury with Fall?  0 0    Risk for fall due to :  No Fall Risks     Follow up Falls evaluation completed Falls evaluation completed Falls evaluation completed Falls evaluation completed Falls evaluation completed    [place]: Home free of loose throw rugs in walkways, pet beds, electrical cords, etc? Yes  Adequate lighting in your home to reduce risk of falls? Yes   ASSISTIVE DEVICES UTILIZED TO PREVENT FALLS: Life alert? No  Use of a cane, walker or w/c? No   TIMED UP AND GO: Was the test performed? Yes .  Length of time to ambulate 10 feet: 10 sec.   Gait steady and fast without use of assistive device  Cognitive Function:  Patient is alert and oriented x3.  Enjoys brain health stimulating activities  like reading and word search puzzles. Manages her own finances and medications without issues. 100% independent.       Immunizations Immunization History  Administered Date(s) Administered   Fluad Quad(high Dose 65+) 06/05/2021   Influenza,inj,Quad PF,6+ Mos 06/29/2013, 05/02/2015, 04/28/2016, 06/16/2018   Influenza-Unspecified 05/28/2017, 04/24/2019   PFIZER(Purple Top)SARS-COV-2 Vaccination 08/11/2019, 09/01/2019, 05/03/2020, 11/02/2020   PNEUMOCOCCAL CONJUGATE-20 12/02/2021   Tdap 03/17/2008, 12/31/2021   Zoster Recombinat (Shingrix) 12/06/2020, 03/13/2021   Screening Tests Health Maintenance  Topic Date Due   DEXA SCAN  Never done   INFLUENZA VACCINE  03/04/2022   COVID-19 Vaccine (5 - Pfizer series) 04/02/2022 (Originally 12/28/2020)   MAMMOGRAM  12/24/2022   COLONOSCOPY (Pts 45-29yr Insurance coverage will need to be confirmed)  12/27/2022   TETANUS/TDAP  01/01/2032   Pneumonia Vaccine 66 Years old  Completed   Hepatitis C Screening  Completed   Zoster Vaccines- Shingrix  Completed   HPV VACCINES  Aged Out   Health Maintenance Health Maintenance Due  Topic Date Due   DEXA SCAN  Never done   INFLUENZA VACCINE  03/04/2022   Lung Cancer Screening: (Low Dose CT Chest recommended if Age 293-80years, 30 pack-year currently smoking OR have quit w/in 15years.) does not qualify.   Vision Screening: Recommended annual ophthalmology exams for early detection of glaucoma and other disorders of the eye.  Dental Screening: Recommended annual dental exams for proper oral hygiene  Community Resource Referral / Chronic Care Management: CRR required this visit?  No  CCM required this visit?  No      Plan:   Keep all routine maintenance appointments.   I have personally reviewed and noted the following in the patient's chart:   Medical and social history Use of alcohol, tobacco or illicit drugs  Current medications and supplements including opioid prescriptions. Patient is  not currently taking opioid prescriptions. Functional ability and status Nutritional status Physical activity Advanced directives List of other physicians Hospitalizations, surgeries, and ER visits in previous 12 months Vitals Screenings to include cognitive, depression, and falls Referrals and appointments  In addition, I have reviewed and discussed with patient certain preventive protocols, quality metrics, and best practice recommendations. A written personalized care plan for preventive services as well as general preventive health recommendations were provided to patient.     Varney Biles, LPN   7/63/9432

## 2022-05-02 ENCOUNTER — Encounter: Payer: Self-pay | Admitting: Internal Medicine

## 2022-05-02 ENCOUNTER — Other Ambulatory Visit: Payer: Self-pay

## 2022-05-02 MED ORDER — OZEMPIC (1 MG/DOSE) 4 MG/3ML ~~LOC~~ SOPN: PEN_INJECTOR | SUBCUTANEOUS | 2 refills | Status: DC

## 2022-05-31 ENCOUNTER — Other Ambulatory Visit: Payer: Self-pay | Admitting: Internal Medicine

## 2022-06-04 ENCOUNTER — Other Ambulatory Visit: Payer: Self-pay | Admitting: Internal Medicine

## 2022-06-04 DIAGNOSIS — I10 Essential (primary) hypertension: Secondary | ICD-10-CM

## 2022-06-16 ENCOUNTER — Encounter: Payer: Self-pay | Admitting: Internal Medicine

## 2022-06-21 MED ORDER — SEMAGLUTIDE-WEIGHT MANAGEMENT 1 MG/0.5ML ~~LOC~~ SOAJ: 1.0000 mg | SUBCUTANEOUS | 2 refills | Status: DC

## 2022-07-01 ENCOUNTER — Ambulatory Visit: Payer: Medicare Other | Admitting: Family

## 2022-07-14 ENCOUNTER — Ambulatory Visit (INDEPENDENT_AMBULATORY_CARE_PROVIDER_SITE_OTHER): Payer: Medicare Other | Admitting: Family

## 2022-07-14 ENCOUNTER — Ambulatory Visit (INDEPENDENT_AMBULATORY_CARE_PROVIDER_SITE_OTHER): Payer: Medicare Other

## 2022-07-14 ENCOUNTER — Encounter: Payer: Self-pay | Admitting: Family

## 2022-07-14 VITALS — BP 136/80 | HR 65 | Temp 96.9°F | Wt 170.4 lb

## 2022-07-14 DIAGNOSIS — F4321 Adjustment disorder with depressed mood: Secondary | ICD-10-CM | POA: Insufficient documentation

## 2022-07-14 DIAGNOSIS — G8929 Other chronic pain: Secondary | ICD-10-CM

## 2022-07-14 DIAGNOSIS — M25511 Pain in right shoulder: Secondary | ICD-10-CM

## 2022-07-14 DIAGNOSIS — J029 Acute pharyngitis, unspecified: Secondary | ICD-10-CM

## 2022-07-14 DIAGNOSIS — J019 Acute sinusitis, unspecified: Secondary | ICD-10-CM

## 2022-07-14 DIAGNOSIS — J329 Chronic sinusitis, unspecified: Secondary | ICD-10-CM | POA: Insufficient documentation

## 2022-07-14 LAB — POCT RAPID STREP A (OFFICE): Rapid Strep A Screen: NEGATIVE

## 2022-07-14 MED ORDER — GUAIFENESIN ER 600 MG PO TB12: 1200.0000 mg | ORAL_TABLET | Freq: Two times a day (BID) | ORAL | 3 refills | Status: DC

## 2022-07-14 MED ORDER — MELOXICAM 7.5 MG PO TABS: 7.5000 mg | ORAL_TABLET | Freq: Every day | ORAL | 1 refills | Status: DC | PRN

## 2022-07-14 NOTE — Assessment & Plan Note (Addendum)
Afebrile.  Duration 7 days.    Strep POC negative.  Counseled patient that most likely viral in etiology.  She will start Mucinex.  If no improvement of symptoms the next couple days, patient will let me know we agreed to start Augmentin .

## 2022-07-14 NOTE — Assessment & Plan Note (Signed)
Patient has suffered losses of 3 family members this year.  I have placed a referral to grief counseling.  Patient politely declines medication at this time.

## 2022-07-14 NOTE — Progress Notes (Signed)
Subjective:    Patient ID: Sarah Delgado, female    DOB: 03-31-1956, 66 y.o.   MRN: [id number]  CC: Sarah Delgado is a 66 y.o. female who presents today for an acute visit.    HPI: Complains of right posterior shoulder arm pain x 6 weeks No known injury. Pain only occurs at night time and she describes that she cannot get comfortable. Some nights pain is worse than others.  No pain in arm during the visit.  She takes advil prn with relief. Massage helps as well.   Retired.  She is a substitute teacher 2 days per week.   She also complains of clear congestion x one week. Today she coughed up thick mucous which was green.  Cold is lingering. She has tried   No cp, sob, wheezing, fever, sinus pain, sore throat. Tried nyquil everynight with relief.   No H/o seasonal allergies.      Smoker  She has suffered loss of 3 members of her family. She is interested in counseling.  Exhusband, and 2 brothers this past year.  HISTORY:  Past Medical History:  Diagnosis Date   Chicken pox    Hyperlipidemia    Hypertension    Tobacco abuse 09/16/2012   Social smoker on weekends.     Past Surgical History:  Procedure Laterality Date   ABDOMINAL HYSTERECTOMY     BREAST BIOPSY Left 12/14/2020   cylinder marker, BENIGN BREAST TISSUE WITH: FIBROCYSTIC CHANGES WITH ASSOCIATED CALCIFICATIONS, USUAL DUCTAL HYPERPLASIA , DENSE STROMAL FIBROSIS, NEGATIVE FOR AYPTIA OR MALIGNANCY   BREAST SURGERY  2000   reduction   COLONOSCOPY WITH PROPOFOL N/A 12/27/2019   Procedure: COLONOSCOPY WITH PROPOFOL;  Surgeon: Jonathon Bellows, MD;  [facility];  Service: Gastroenterology;  Laterality: N/A;   REDUCTION MAMMAPLASTY Bilateral    at least 15 years ago   TOTAL ABDOMINAL HYSTERECTOMY W/ BILATERAL SALPINGOOPHORECTOMY  2005   Family History  Problem Relation Age of Onset   Arthritis Mother    Hypertension Mother    Mental illness Mother    Emphysema Father    COPD Sister    Diabetes  Sister    Kidney disease Sister    Diabetes Brother    Hypertension Brother    Heart disease Maternal Grandfather    Diabetes Brother    Hypertension Brother    Cancer Brother        prostate   Hypertension Brother    Stroke Brother    Hypertension Brother    Cancer Maternal Aunt        esophageal ca,  history of etoh and tobacco    Breast cancer Neg Hx     Allergies: Patient has no known allergies. Current Outpatient Medications on File Prior to Visit  Medication Sig Dispense Refill   aspirin EC 81 MG tablet Take 81 mg by mouth daily.     atorvastatin (LIPITOR) 10 MG tablet TAKE 1 TABLET BY MOUTH EVERY DAY 90 tablet 1   Biotin 1000 MCG tablet Take 1,000 mcg by mouth daily.      Cholecalciferol (VITAMIN D-3) 1000 units CAPS Take 1 capsule by mouth daily.     Coenzyme Q10 10 MG capsule Take 10 mg by mouth daily.     estradiol (ESTRACE) 0.5 MG tablet Take 2 tablets (1 mg total) by mouth daily. 90 tablet 0   triamterene-hydrochlorothiazide (MAXZIDE-25) 37.5-25 MG tablet Take 0.5 tablets by mouth daily. 45 tablet 0   vitamin B-12 (CYANOCOBALAMIN) 500  MCG tablet Take 500 mcg daily by mouth.     vitamin E 180 MG (400 UNITS) capsule Take 400 Units daily by mouth.     EPINEPHrine 0.3 mg/0.3 mL IJ SOAJ injection Inject 0.3 mg into the muscle as needed for anaphylaxis. (Patient not taking: Reported on 07/14/2022) 1 each 1   No current facility-administered medications on file prior to visit.    Social History   Tobacco Use   Smoking status: Some Days    Types: Cigarettes    Start date: 11/18/2016   Smokeless tobacco: Never  Vaping Use   Vaping Use: Never used  Substance Use Topics   Alcohol use: Yes    Comment: OCC wine   Drug use: No    Review of Systems  Constitutional:  Negative for chills and fever.  Respiratory:  Negative for cough.   Cardiovascular:  Negative for chest pain and palpitations.  Gastrointestinal:  Negative for nausea and vomiting.      Objective:     BP 136/80   Pulse 65   Temp (!) 96.9 F (36.1 C) (Oral)   Wt 170 lb 6.4 oz (77.3 kg)   SpO2 99%   BMI 27.50 kg/m    Physical Exam Vitals reviewed.  Constitutional:      Appearance: She is well-developed.  HENT:     Head: Normocephalic and atraumatic.     Right Ear: Hearing, tympanic membrane, ear canal and external ear normal. No decreased hearing noted. No drainage, swelling or tenderness. No middle ear effusion. No foreign body. Tympanic membrane is not erythematous or bulging.     Left Ear: Hearing, tympanic membrane, ear canal and external ear normal. No decreased hearing noted. No drainage, swelling or tenderness.  No middle ear effusion. No foreign body. Tympanic membrane is not erythematous or bulging.     Nose: Nose normal. No rhinorrhea.     Right Sinus: No maxillary sinus tenderness or frontal sinus tenderness.     Left Sinus: No maxillary sinus tenderness or frontal sinus tenderness.     Mouth/Throat:     Pharynx: Uvula midline. No oropharyngeal exudate or posterior oropharyngeal erythema.     Tonsils: No tonsillar abscesses.  Eyes:     Conjunctiva/sclera: Conjunctivae normal.  Cardiovascular:     Rate and Rhythm: Regular rhythm.     Pulses: Normal pulses.     Heart sounds: Normal heart sounds.  Pulmonary:     Effort: Pulmonary effort is normal.     Breath sounds: Normal breath sounds. No wheezing, rhonchi or rales.  Musculoskeletal:     Right shoulder: No swelling or bony tenderness. Normal range of motion.     Cervical back: Normal. No tenderness or bony tenderness. No pain with movement.     Comments: Right Shoulder:   No asymmetry of shoulders when comparing right and left.No pain with palpation over glenohumeral joint lines,  joint, AC joint, or bicipital groove. No pain with internal and external rotation. No pain with resisted lateral extension .   Negative active painful arc sign. Negative passive arc ( Neer's). Negative drop arm.  No pain, swelling,  or ecchymosis noted over long head of biceps.  Strength and sensation normal BUE's.   Lymphadenopathy:     Head:     Right side of head: No submental, submandibular, tonsillar, preauricular, posterior auricular or occipital adenopathy.     Left side of head: No submental, submandibular, tonsillar, preauricular, posterior auricular or occipital adenopathy.     Cervical: No  cervical adenopathy.  Skin:    General: Skin is warm and dry.  Neurological:     Mental Status: She is alert.  Psychiatric:        Speech: Speech normal.        Behavior: Behavior normal.        Thought Content: Thought content normal.        Assessment & Plan:   Problem List Items Addressed This Visit       Respiratory   Sinusitis    Afebrile.  Duration 7 days.    Strep POC negative.  Counseled patient that most likely viral in etiology.  She will start Mucinex.  If no improvement of symptoms the next couple days, patient will let me know we agreed to start Augmentin .       Relevant Medications   guaiFENesin (MUCINEX) 600 MG 12 hr tablet     Other   Grief reaction    Patient has suffered losses of 3 family members this year.  I have placed a referral to grief counseling.  Patient politely declines medication at this time.       Relevant Orders   Ambulatory referral to Psychology   Right shoulder pain - Primary    Presentation consistent with bursitis and/or impingement.  No pain elicited on exam today.  Pending cervical x-ray and right shoulder x-ray.  Start meloxicam.  Exercises provided.  Consider referral to orthopedics as patient may benefit from corticosteroid injection if no improvement      Relevant Medications   meloxicam (MOBIC) 7.5 MG tablet   Other Relevant Orders   DG Shoulder Right   DG Cervical Spine Complete   Other Visit Diagnoses     Sore throat       Relevant Orders   POCT rapid strep A (Completed)         I have discontinued Fonda L. Endsley's Semaglutide-Weight  Management. I am also having her start on meloxicam and guaiFENesin. Additionally, I am having her maintain her Coenzyme Q10, Biotin, Vitamin D-3, aspirin EC, vitamin B-12, vitamin E, EPINEPHrine, atorvastatin, estradiol, and triamterene-hydrochlorothiazide.   Meds ordered this encounter  Medications   meloxicam (MOBIC) 7.5 MG tablet    Sig: Take 1 tablet (7.5 mg total) by mouth daily as needed for pain.    Dispense:  30 tablet    Refill:  1    Order Specific Question:   Supervising Provider    Answer:   Deborra Medina L [[id number]]   guaiFENesin (MUCINEX) 600 MG 12 hr tablet    Sig: Take 2 tablets (1,200 mg total) by mouth 2 (two) times daily.    Dispense:  28 tablet    Refill:  3    Order Specific Question:   Supervising Provider    Answer:   Crecencio Mc [[id number]]    Return precautions given.   Risks, benefits, and alternatives of the medications and treatment plan prescribed today were discussed, and patient expressed understanding.   Education regarding symptom management and diagnosis given to patient on AVS.  Continue to follow with Crecencio Mc, MD for routine health maintenance.   Sarah Delgado and I agreed with plan.   Mable Paris, FNP

## 2022-07-14 NOTE — Assessment & Plan Note (Addendum)
Presentation consistent with bursitis and/or impingement.  No pain elicited on exam today.  Pending cervical x-ray and right shoulder x-ray.  Start meloxicam.  Exercises provided.  Consider referral to orthopedics as patient may benefit from corticosteroid injection if no improvement

## 2022-07-14 NOTE — Patient Instructions (Signed)
I have sent in a prescription for Mucinex which is an expectorant to help break up thick congestion.  You may continue NyQuil at night however I like for you to take Mucinex as well to break up congestion.  Please drink plenty of water.    as discussed if not better in the next couple days, please let me know as I would send in Augmentin.  Start meloxicam for right shoulder pain.  Exercises below.  Referral for grief counselor  Let us know if you dont hear back within a week in regards to an appointment being scheduled.   So that you are aware, if you are [facility] MyChart user , please pay attention to your MyChart messages as you may receive a MyChart message with a phone number to call and schedule this test/appointment own your own from our referral coordinator. This is a new process so I do not want you to miss this message.  If you are not a MyChart user, you will receive a phone call.   Shoulder Impingement Syndrome Rehab Ask your health care provider which exercises are safe for you. Do exercises exactly as told by your health care provider and adjust them as directed. It is normal to feel mild stretching, pulling, tightness, or discomfort as you do these exercises. Stop right away if you feel sudden pain or your pain gets worse. Do not begin these exercises until told by your health care provider. Stretching and range-of-motion exercise This exercise warms up your muscles and joints and improves the movement and flexibility of your shoulder. This exercise also helps to relieve pain and stiffness. Passive horizontal adduction In passive adduction, you use your other hand to move the injured arm toward your body. The injured arm does not move on its own. In this movement, your arm is moved across your body in the horizontal plane (horizontal adduction). Sit or stand and pull your left / right elbow across your chest, toward your other shoulder. Stop when you feel a gentle stretch in the back of  your shoulder and upper arm. Keep your arm at shoulder height. Keep your arm as close to your body as you comfortably can. Hold for __________ seconds. Slowly return to the starting position. Repeat __________ times. Complete this exercise __________ times a day. Strengthening exercises These exercises build strength and endurance in your shoulder. Endurance is the ability to use your muscles for a long time, even after they get tired. External rotation, isometric This is an exercise in which you press the back of your wrist against a door frame without moving your shoulder joint (isometric). Stand or sit in a doorway, facing the door frame. Bend your left / right elbow and place the back of your wrist against the door frame. Only the back of your wrist should be touching the frame. Keep your upper arm at your side. Gently press your wrist against the door frame, as if you are trying to push your arm away from your abdomen (external rotation). Press as hard as you are able without pain. Avoid shrugging your shoulder while you press your wrist against the door frame. Keep your shoulder blade tucked down toward the middle of your back. Hold for __________ seconds. Slowly release the tension, and relax your muscles completely before you repeat the exercise. Repeat __________ times. Complete this exercise __________ times a day. Internal rotation, isometric This is an exercise in which you press your palm against a door frame without moving your shoulder  joint (isometric). Stand or sit in a doorway, facing the door frame. Bend your left / right elbow and place the palm of your hand against the door frame. Only your palm should be touching the frame. Keep your upper arm at your side. Gently press your hand against the door frame, as if you are trying to push your arm toward your abdomen (internal rotation). Press as hard as you are able without pain. Avoid shrugging your shoulder while you press  your hand against the door frame. Keep your shoulder blade tucked down toward the middle of your back. Hold for __________ seconds. Slowly release the tension, and relax your muscles completely before you repeat the exercise. Repeat __________ times. Complete this exercise __________ times a day. Scapular protraction, supine  Lie on your back on a firm surface (supine position). Hold a __________ weight in your left / right hand. Raise your left / right arm straight into the air so your hand is directly above your shoulder joint. Push the weight into the air so your shoulder (scapula) lifts off the surface that you are lying on. The scapula will push up or forward (protraction). Do not move your head, neck, or back. Hold for __________ seconds. Slowly return to the starting position. Let your muscles relax completely before you repeat this exercise. Repeat __________ times. Complete this exercise __________ times a day. Scapular retraction  Sit in a stable chair without armrests, or stand up. Secure an exercise band to a stable object in front of you so the band is at shoulder height. Hold one end of the exercise band in each hand. Your palms should face down. Squeeze your shoulder blades together (retraction) and move your elbows slightly behind you. Do not shrug your shoulders upward while you do this. Hold for __________ seconds. Slowly return to the starting position. Repeat __________ times. Complete this exercise __________ times a day. Shoulder extension  Sit in a stable chair without armrests, or stand up. Secure an exercise band to a stable object in front of you so the band is above shoulder height. Hold one end of the exercise band in each hand. Straighten your elbows and lift your hands up to shoulder height. Squeeze your shoulder blades together and pull your hands down to the sides of your thighs (extension). Stop when your hands are straight down by your sides. Do not let  your hands go behind your body. Hold for __________ seconds. Slowly return to the starting position. Repeat __________ times. Complete this exercise __________ times a day. This information is not intended to replace advice given to you by your health care provider. Make sure you discuss any questions you have with your health care provider. Document Revised: 11/12/2018 Document Reviewed: 08/16/2018 Elsevier Patient Education  [place].

## 2022-07-15 NOTE — Progress Notes (Signed)
This encounter was created in error - please disregard.

## 2022-07-15 NOTE — Telephone Encounter (Signed)
MyChart messgae sent to patient. 

## 2022-07-27 ENCOUNTER — Ambulatory Visit
Admission: EM | Admit: 2022-07-27 | Discharge: 2022-07-27 | Disposition: A | Discharge status: Home / Self Care | Payer: Medicare Other

## 2022-07-27 DIAGNOSIS — R6889 Other general symptoms and signs: Secondary | ICD-10-CM

## 2022-07-27 NOTE — ED Triage Notes (Signed)
Pt. Presents to UC w/ c/o a low-grade fever, congestion and nasal drainage for the past 3 weeks.

## 2022-07-27 NOTE — Discharge Instructions (Addendum)
Recommend continued use of OTC medication to control your fever, body aches, chills.  Also recommend that you use antidiarrheal agents available over-the-counter at the pharmacy if your symptoms of diarrhea continue after today.  Please read the directions on the box carefully to prevent overuse of this medication.  Follow up here or with your primary care provider if your symptoms are worsening or not improving.    :1}

## 2022-07-27 NOTE — ED Provider Notes (Signed)
[facility]    CSN: [id number] Arrival date & time: 07/27/22  0900      History   Chief Complaint No chief complaint on file.   HPI Sarah Delgado is a 66 y.o. female.   HPI  Presents to urgent care with report of "low-grade fever" and diarrhea starting 2 days ago.  States she awoke at about 5 AM with multiple episodes of diarrhea.  She contacted her PCP who advised her to watch for signs of dehydration especially if she had more than 6-7 episodes in a day.  She states the diarrhea improved that day and then started on new yesterday accompanied by fever of 100.x at home  Patient is afebrile during the office visit.  Patient endorses a upper respiratory infection which included nasal congestion and drainage about 2 to 3 weeks ago.  That was accompanied by cough.  She contacted her primary care at that time and was prescribed Mucinex which she states resolved the issues completely.  Past Medical History:  Diagnosis Date   Chicken pox    Hyperlipidemia    Hypertension    Tobacco abuse 09/16/2012   Social smoker on weekends.      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   Encounter for preventive health examination 03/19/2014   Menopausal syndrome on hormone replacement therapy 12/28/2013   S/P total hysterectomy and bilateral salpingo-oophorectomy 06/29/2013   Overweight (BMI 25.0-29.9) 09/16/2012   Essential hypertension, benign 09/16/2012   Hyperlipidemia LDL goal <100 09/16/2012    Past Surgical History:  Procedure Laterality Date   ABDOMINAL HYSTERECTOMY     BREAST BIOPSY  Left 12/14/2020   cylinder marker, BENIGN BREAST TISSUE WITH: FIBROCYSTIC CHANGES WITH ASSOCIATED CALCIFICATIONS, USUAL DUCTAL HYPERPLASIA , DENSE STROMAL FIBROSIS, NEGATIVE FOR AYPTIA OR MALIGNANCY   BREAST SURGERY  2000   reduction   COLONOSCOPY WITH PROPOFOL N/A 12/27/2019   Procedure: COLONOSCOPY WITH PROPOFOL;  Surgeon: Jonathon Bellows, MD;  Location: [facility] ENDOSCOPY;  Service: Gastroenterology;  Laterality: N/A;   REDUCTION MAMMAPLASTY Bilateral    at least 15 years ago   TOTAL ABDOMINAL HYSTERECTOMY W/ BILATERAL SALPINGOOPHORECTOMY  2005    OB History   No obstetric history on file.      Home Medications    Prior to Admission medications   Medication Sig Start Date End Date Taking? Authorizing Provider  aspirin EC 81 MG tablet Take 81 mg by mouth daily.    [provider]  atorvastatin (LIPITOR) 10 MG tablet TAKE 1 TABLET BY MOUTH EVERY DAY 06/02/22   Crecencio Mc, MD  Biotin 1000 MCG tablet Take 1,000 mcg by mouth daily.     [provider]  Cholecalciferol (VITAMIN D-3) 1000 units CAPS Take 1 capsule by mouth daily.    [provider]  Coenzyme Q10 10 MG capsule Take 10 mg by mouth daily.    [provider]  EPINEPHrine 0.3 mg/0.3 mL IJ SOAJ injection Inject 0.3 mg into the muscle as needed for anaphylaxis. Patient not taking: Reported on 07/14/2022 11/12/20   Crecencio Mc, MD  estradiol (ESTRACE) 0.5 MG tablet  Take 2 tablets (1 mg total) by mouth daily. 06/05/22   Crecencio Mc, MD  guaiFENesin (MUCINEX) 600 MG 12 hr tablet Take 2 tablets (1,200 mg total) by mouth 2 (two) times daily. 07/14/22   Burnard Hawthorne, FNP  meloxicam (MOBIC) 7.5 MG tablet Take 1 tablet (7.5 mg total) by mouth daily as needed for pain. 07/14/22   Burnard Hawthorne, FNP  triamterene-hydrochlorothiazide (MAXZIDE-25) 37.5-25 MG tablet Take 0.5 tablets by mouth daily. 06/05/22   Crecencio Mc, MD  vitamin B-12 (CYANOCOBALAMIN) 500 MCG tablet Take 500 mcg daily by  mouth.    [provider]  vitamin E 180 MG (400 UNITS) capsule Take 400 Units daily by mouth.    [provider]    Family History Family History  Problem Relation Age of Onset   Arthritis Mother    Hypertension Mother    Mental illness Mother    Emphysema Father    COPD Sister    Diabetes Sister    Kidney disease Sister    Diabetes Brother    Hypertension Brother    Heart disease Maternal Grandfather    Diabetes Brother    Hypertension Brother    Cancer Brother        prostate   Hypertension Brother    Stroke Brother    Hypertension Brother    Cancer Maternal Aunt        esophageal ca,  history of etoh and tobacco    Breast cancer Neg Hx     Social History Social History   Tobacco Use   Smoking status: Some Days    Types: Cigarettes    Start date: 11/18/2016   Smokeless tobacco: Never  Vaping Use   Vaping Use: Never used  Substance Use Topics   Alcohol use: Yes    Comment: OCC wine   Drug use: No     Allergies   Patient has no known allergies.   Review of Systems Review of Systems   Physical Exam Triage Vital Signs ED Triage Vitals  Enc Vitals Group     BP      Pulse      Resp      Temp      Temp src      SpO2      Weight      Height      Head Circumference      Peak Flow      Pain Score      Pain Loc      Pain Edu?      Excl. in [place]?    No data found.  Updated Vital Signs There were no vitals taken for this visit.  Visual Acuity Right Eye Distance:   Left Eye Distance:   Bilateral Distance:    Right Eye Near:   Left Eye Near:    Bilateral Near:     Physical Exam Vitals reviewed.  Constitutional:      Appearance: Normal appearance.  HENT:     Mouth/Throat:     Pharynx: No oropharyngeal exudate or posterior oropharyngeal erythema.  Cardiovascular:     Rate and Rhythm: Normal rate and regular rhythm.     Pulses: Normal pulses.     Heart sounds: Normal heart sounds.  Pulmonary:     Effort: Pulmonary  effort is normal.     Breath sounds: Normal breath sounds.  Abdominal:     General: Abdomen is flat. Bowel sounds are increased.  Palpations: Abdomen is soft.     Tenderness: There is no abdominal tenderness.  Skin:    General: Skin is warm and dry.  Neurological:     General: No focal deficit present.     Mental Status: She is alert and oriented to person, place, and time.  Psychiatric:        Mood and Affect: Mood normal.        Behavior: Behavior normal.      UC Treatments / Results  Labs (all labs ordered are listed, but only abnormal results are displayed) Labs Reviewed - No data to display  EKG   Radiology No results found.  Procedures Procedures (including critical care time)  Medications Ordered in UC Medications - No data to display  Initial Impression / Assessment and Plan / UC Course  I have reviewed the triage vital signs and the nursing notes.  Pertinent labs & imaging results that were available during my care of the patient were reviewed by me and considered in my medical decision making (see chart for details).   Patient is afebrile here with recent antipyretics. Satting well on room air. Overall is well appearing, well hydrated, without respiratory distress. Pulmonary exam is unremarkable.  Lungs CTAB without wheezing, rhonchi, rales.  No cough during exam.  No pharyngeal erythema or peritonsillar exudates.  Abdomen is soft and nontender.  Bowel sounds are upper active.  Suspect viral process including influenza.  She is just outside the treatment window for influenza and will not recommend Tamiflu.  Am recommending use of OTC medication for symptom control including antifever medication and antidiarrheals if symptoms continue past tomorrow.  Final Clinical Impressions(s) / UC Diagnoses   Final diagnoses:  None   Discharge Instructions   None    ED Prescriptions   None    PDMP not reviewed this encounter.   Rose Phi,  [place] 07/27/22 1217

## 2022-08-14 ENCOUNTER — Ambulatory Visit: Payer: BC Managed Care – PPO | Admitting: Clinical

## 2022-08-25 ENCOUNTER — Other Ambulatory Visit: Payer: Self-pay | Admitting: Internal Medicine

## 2022-08-25 DIAGNOSIS — I10 Essential (primary) hypertension: Secondary | ICD-10-CM

## 2022-08-29 ENCOUNTER — Encounter: Payer: Self-pay | Admitting: Pharmacist

## 2022-09-02 ENCOUNTER — Ambulatory Visit: Payer: Medicare PPO | Admitting: Clinical

## 2022-09-05 ENCOUNTER — Ambulatory Visit: Payer: Medicare PPO | Admitting: Internal Medicine

## 2022-09-05 ENCOUNTER — Encounter: Payer: Self-pay | Admitting: Internal Medicine

## 2022-09-05 VITALS — BP 118/78 | HR 70 | Temp 98.0°F | Ht 66.0 in | Wt 167.6 lb

## 2022-09-05 DIAGNOSIS — R7303 Prediabetes: Secondary | ICD-10-CM

## 2022-09-05 DIAGNOSIS — E785 Hyperlipidemia, unspecified: Secondary | ICD-10-CM | POA: Diagnosis not present

## 2022-09-05 DIAGNOSIS — R002 Palpitations: Secondary | ICD-10-CM | POA: Diagnosis not present

## 2022-09-05 DIAGNOSIS — Z23 Encounter for immunization: Secondary | ICD-10-CM | POA: Diagnosis not present

## 2022-09-05 DIAGNOSIS — I10 Essential (primary) hypertension: Secondary | ICD-10-CM

## 2022-09-05 DIAGNOSIS — F4329 Adjustment disorder with other symptoms: Secondary | ICD-10-CM

## 2022-09-05 DIAGNOSIS — R5383 Other fatigue: Secondary | ICD-10-CM

## 2022-09-05 NOTE — Patient Instructions (Addendum)
I am checking your electrolytes and thyroid today   If all are normal., I will order the cardiac monitor that you can wear for 14 days to detect any irregular heart rhythm.  This is called a "ZIO" Monitor and the readings will be read by a cardiologist

## 2022-09-05 NOTE — Progress Notes (Unsigned)
Subjective:  Patient ID: Sarah Delgado, female    DOB: 04-15-1956  Age: 67 y.o. MRN: [id number]  CC: The primary encounter diagnosis was Palpitations. Diagnoses of Essential hypertension, benign, Hyperlipidemia LDL goal <100, Prediabetes, Other fatigue, Need for immunization against influenza, and Grief reaction with prolonged bereavement were also pertinent to this visit.   HPI Sarah Delgado presents for  Chief Complaint  Patient presents with   Fatigue   Palpitations    Treated I ED dec 24 for flu like illness  with diarrhea ,  no labs or imaging done . no meds given   Since then has been fatigued,  having palpitations :  EKG done in office    2) right shoulder aching : chornic since 2020 again with supine position .  Reviewed cervical spine films:  mild DDD at c5-6 with anterior bon spurring.  Shoulder films were normal   Fatigue: has had multiple viral illnesses over the holidays,  none in 3 weeks . Feels it is tied to her grief after losing her brother in August.    Attending counselling ,  has been through several providers, finally found a woma she connects with.  Sleeps well but wakes up very 5 hours , then goes back to sleep after watching TV .  Her exercise pattern stopped in August    Palpitations:  feels like the pulse skips a beat, then regains normal pattern.  Repeats istelf   lasts a couple of hours,  no other symptoms . Thinks it may be related to start of   wegovy for the past 3 weeks.  Notices palpitations may be occurring on the day of each dose    Workup in the past has included an ECHO in 2020 done bc of dizziness and murmur.:  normal EF, mild AV thckening , mid TR . Repeat in 3-5 years   Outpatient Medications Prior to Visit  Medication Sig Dispense Refill   aspirin EC 81 MG tablet Take 81 mg by mouth daily.     atorvastatin (LIPITOR) 10 MG tablet TAKE 1 TABLET BY MOUTH EVERY DAY 90 tablet 1   Biotin 1000 MCG tablet Take 1,000 mcg by mouth daily.       Cholecalciferol (VITAMIN D-3) 1000 units CAPS Take 1 capsule by mouth daily.     Coenzyme Q10 10 MG capsule Take 10 mg by mouth daily.     estradiol (ESTRACE) 0.5 MG tablet Take 2 tablets (1 mg total) by mouth daily. 90 tablet 0   meloxicam (MOBIC) 7.5 MG tablet Take 1 tablet (7.5 mg total) by mouth daily as needed for pain. 30 tablet 1   triamterene-hydrochlorothiazide (MAXZIDE-25) 37.5-25 MG tablet Take 0.5 tablets by mouth daily. 45 tablet 0   vitamin B-12 (CYANOCOBALAMIN) 500 MCG tablet Take 500 mcg daily by mouth.     vitamin E 180 MG (400 UNITS) capsule Take 400 Units daily by mouth.     WEGOVY 1 MG/0.5ML SOAJ Inject 1 mg into the skin once a week.     EPINEPHrine 0.3 mg/0.3 mL IJ SOAJ injection Inject 0.3 mg into the muscle as needed for anaphylaxis. (Patient not taking: Reported on 07/14/2022) 1 each 1   guaiFENesin (MUCINEX) 600 MG 12 hr tablet Take 2 tablets (1,200 mg total) by mouth 2 (two) times daily. (Patient not taking: Reported on 09/05/2022) 28 tablet 3   No facility-administered medications prior to visit.    Review of Systems;  Patient denies headache, fevers, malaise, unintentional  weight loss, skin rash, eye pain, sinus congestion and sinus pain, sore throat, dysphagia,  hemoptysis , cough, dyspnea, wheezing, chest pain, palpitations, orthopnea, edema, abdominal pain, nausea, melena, diarrhea, constipation, flank pain, dysuria, hematuria, urinary  Frequency, nocturia, numbness, tingling, seizures,  Focal weakness, Loss of consciousness,  Tremor, insomnia, depression, anxiety, and suicidal ideation.      Objective:  BP 118/78   Pulse 70   Temp 98 F (36.7 C) (Oral)   Ht '5\' 6"'$  (1.676 m)   Wt 167 lb 9.6 oz (76 kg)   SpO2 98%   BMI 27.05 kg/m   BP Readings from Last 3 Encounters:  09/05/22 118/78  07/27/22 (!) 150/82  07/14/22 136/80    Wt Readings from Last 3 Encounters:  09/05/22 167 lb 9.6 oz (76 kg)  07/14/22 170 lb 6.4 oz (77.3 kg)  03/17/22 163 lb 6.4  oz (74.1 kg)    Physical Exam Vitals reviewed.  Constitutional:      General: She is not in acute distress.    Appearance: Normal appearance. She is normal weight. She is not ill-appearing, toxic-appearing or diaphoretic.  HENT:     Head: Normocephalic.  Eyes:     General: No scleral icterus.       Right eye: No discharge.        Left eye: No discharge.     Conjunctiva/sclera: Conjunctivae normal.  Cardiovascular:     Rate and Rhythm: Normal rate and regular rhythm.     Heart sounds: Normal heart sounds.  Pulmonary:     Effort: Pulmonary effort is normal. No respiratory distress.     Breath sounds: Normal breath sounds.  Musculoskeletal:        General: Normal range of motion.  Skin:    General: Skin is warm and dry.  Neurological:     General: No focal deficit present.     Mental Status: She is alert and oriented to person, place, and time. Mental status is at baseline.  Psychiatric:        Mood and Affect: Mood normal.        Behavior: Behavior normal.        Thought Content: Thought content normal.        Judgment: Judgment normal.     Lab Results  Component Value Date   HGBA1C 5.8 12/02/2021   HGBA1C 6.1 06/05/2021   HGBA1C 5.9 11/12/2020    Lab Results  Component Value Date   CREATININE 1.05 09/05/2022   CREATININE 0.97 12/02/2021   CREATININE 0.96 09/09/2021    Lab Results  Component Value Date   WBC 6.7 09/05/2022   HGB 15.2 09/05/2022   HCT 43.7 09/05/2022   PLT 362 09/05/2022   GLUCOSE 81 09/05/2022   CHOL 214 (H) 12/02/2021   TRIG 127.0 12/02/2021   HDL 72.80 12/02/2021   LDLDIRECT 122.0 12/02/2021   LDLCALC 116 (H) 12/02/2021   ALT 11 09/05/2022   AST 15 09/05/2022   NA 135 09/05/2022   K 4.0 09/05/2022   CL 98 09/05/2022   CREATININE 1.05 09/05/2022   BUN 18 09/05/2022   CO2 25 09/05/2022   TSH 0.67 09/05/2022   HGBA1C 5.8 12/02/2021   MICROALBUR <0.7 06/11/2017    No results found.  Assessment & Plan:   .Palpitations Assessment & Plan: Screening labss are normal.  She has no ominous  symptoms .  ZIO monitor offered   Orders: -     EKG 12-Lead -     Magnesium -  TSH -     CBC with Differential/Platelet -     COMPLETE METABOLIC PANEL WITH GFR  Essential hypertension, benign Assessment & Plan: Well controlled on current regimen. Renal function stable, no changes today.  Lab Results  Component Value Date   CREATININE 1.05 09/05/2022   Lab Results  Component Value Date   NA 135 09/05/2022   K 4.0 09/05/2022   CL 98 09/05/2022   CO2 25 09/05/2022      Hyperlipidemia LDL goal <100  Prediabetes Assessment & Plan: Her a1c  Has suggested that  she is at risk.  Continue [id number] for weight management.  Low GI diet  And regular participation in  exercise .  Repeat labs due Lab Results  Component Value Date   HGBA1C 5.8 12/02/2021       Other fatigue -     Iron, TIBC and Ferritin Panel -     B12 and Folate Panel  Need for immunization against influenza -     Flu Vaccine QUAD High Dose(Fluad)  Grief reaction with prolonged bereavement Assessment & Plan: Followoing the loss of her brother in August .  She has been meeting with a grief counsellor       I provided 30 minutes of face-to-face time during this encounter reviewing patient's last visit with me, patient's  most recent visit with cardiology,  nephrology,  and neurology,  recent surgical and non surgical procedures, previous  labs and imaging studies, counseling on currently addressed issues,  and post visit ordering to diagnostics and therapeutics .   Follow-up: Return in about 4 weeks (around 10/03/2022).   Crecencio Mc, MD

## 2022-09-06 LAB — COMPLETE METABOLIC PANEL WITH GFR
AG Ratio: 1.6 (calc) (ref 1.0–2.5)
ALT: 11 U/L (ref 6–29)
AST: 15 U/L (ref 10–35)
Albumin: 4.7 g/dL (ref 3.6–5.1)
Alkaline phosphatase (APISO): 46 U/L (ref 37–153)
BUN: 18 mg/dL (ref 7–25)
CO2: 25 mmol/L (ref 20–32)
Calcium: 10.2 mg/dL (ref 8.6–10.4)
Chloride: 98 mmol/L (ref 98–110)
Creat: 1.05 mg/dL (ref 0.50–1.05)
Globulin: 3 g/dL (calc) (ref 1.9–3.7)
Glucose, Bld: 81 mg/dL (ref 65–99)
Potassium: 4 mmol/L (ref 3.5–5.3)
Sodium: 135 mmol/L (ref 135–146)
Total Bilirubin: 0.4 mg/dL (ref 0.2–1.2)
Total Protein: 7.7 g/dL (ref 6.1–8.1)
eGFR: 58 mL/min/{1.73_m2} — ABNORMAL LOW (ref >=60)

## 2022-09-06 LAB — CBC WITH DIFFERENTIAL/PLATELET
Absolute Monocytes: 549 cells/uL (ref 200–950)
Basophils Absolute: 60 cells/uL (ref 0–200)
Basophils Relative: 0.9 %
Eosinophils Absolute: 228 cells/uL (ref 15–500)
Eosinophils Relative: 3.4 %
HCT: 43.7 % (ref 35.0–45.0)
Hemoglobin: 15.2 g/dL (ref 11.7–15.5)
Lymphs Abs: 2553 cells/uL (ref 850–3900)
MCH: 30 pg (ref 27.0–33.0)
MCHC: 34.8 g/dL (ref 32.0–36.0)
MCV: 86.4 fL (ref 80.0–100.0)
MPV: 10.5 fL (ref 7.5–12.5)
Monocytes Relative: 8.2 %
Neutro Abs: 3310 cells/uL (ref 1500–7800)
Neutrophils Relative %: 49.4 %
Platelets: 362 10*3/uL (ref 140–400)
RBC: 5.06 10*6/uL (ref 3.80–5.10)
RDW: 12.4 % (ref 11.0–15.0)
Total Lymphocyte: 38.1 %
WBC: 6.7 10*3/uL (ref 3.8–10.8)

## 2022-09-06 LAB — IRON,TIBC AND FERRITIN PANEL
%SAT: 32 % (calc) (ref 16–45)
Ferritin: 68 ng/mL (ref 16–288)
Iron: 69 ug/dL (ref 45–160)
TIBC: 215 mcg/dL (calc) — ABNORMAL LOW (ref 250–450)

## 2022-09-06 LAB — B12 AND FOLATE PANEL
Folate: 12.1 ng/mL
Vitamin B-12: 1768 pg/mL — ABNORMAL HIGH (ref 200–1100)

## 2022-09-06 LAB — TSH: TSH: 0.67 mIU/L (ref 0.40–4.50)

## 2022-09-06 LAB — MAGNESIUM: Magnesium: 2 mg/dL (ref 1.5–2.5)

## 2022-09-07 DIAGNOSIS — R002 Palpitations: Secondary | ICD-10-CM | POA: Insufficient documentation

## 2022-09-07 DIAGNOSIS — R5383 Other fatigue: Secondary | ICD-10-CM | POA: Insufficient documentation

## 2022-09-07 DIAGNOSIS — F4329 Adjustment disorder with other symptoms: Secondary | ICD-10-CM | POA: Insufficient documentation

## 2022-09-07 NOTE — Assessment & Plan Note (Signed)
Screening labss are normal.  She has no ominous  symptoms .  ZIO monitor offered

## 2022-09-07 NOTE — Assessment & Plan Note (Signed)
Her a1c  Has suggested that  she is at risk.  Continue [id number] for weight management.  Low GI diet  And regular participation in  exercise .  Repeat labs due Lab Results  Component Value Date   HGBA1C 5.8 12/02/2021

## 2022-09-07 NOTE — Assessment & Plan Note (Signed)
Followoing the loss of her brother in August .  She has been meeting with a Print production planner

## 2022-09-07 NOTE — Assessment & Plan Note (Signed)
Well controlled on current regimen. Renal function stable, no changes today.  Lab Results  Component Value Date   CREATININE 1.05 09/05/2022   Lab Results  Component Value Date   NA 135 09/05/2022   K 4.0 09/05/2022   CL 98 09/05/2022   CO2 25 09/05/2022

## 2022-09-08 ENCOUNTER — Other Ambulatory Visit: Payer: Self-pay

## 2022-09-08 ENCOUNTER — Ambulatory Visit: Payer: Medicare PPO | Attending: Internal Medicine

## 2022-09-08 DIAGNOSIS — R002 Palpitations: Secondary | ICD-10-CM

## 2022-09-14 DIAGNOSIS — R002 Palpitations: Secondary | ICD-10-CM

## 2022-09-17 ENCOUNTER — Telehealth: Payer: Self-pay | Admitting: Internal Medicine

## 2022-09-17 NOTE — Telephone Encounter (Signed)
Pt dental office called in this morning asking if her ZIO heart monitor will interfere with their ultrasonic machine for deep cleaning????

## 2022-09-17 NOTE — Telephone Encounter (Signed)
Dentist office advised deep cleaning should not effect zio per Dr Derrel Nip

## 2022-10-08 ENCOUNTER — Encounter: Payer: Self-pay | Admitting: Internal Medicine

## 2022-10-08 ENCOUNTER — Ambulatory Visit: Payer: Medicare PPO | Admitting: Internal Medicine

## 2022-10-08 VITALS — BP 116/72 | HR 65 | Temp 98.3°F | Ht 66.0 in | Wt 169.4 lb

## 2022-10-08 DIAGNOSIS — I10 Essential (primary) hypertension: Secondary | ICD-10-CM

## 2022-10-08 DIAGNOSIS — E663 Overweight: Secondary | ICD-10-CM

## 2022-10-08 DIAGNOSIS — R002 Palpitations: Secondary | ICD-10-CM

## 2022-10-08 DIAGNOSIS — F4329 Adjustment disorder with other symptoms: Secondary | ICD-10-CM

## 2022-10-08 DIAGNOSIS — N951 Menopausal and female climacteric states: Secondary | ICD-10-CM

## 2022-10-08 DIAGNOSIS — Z79890 Hormone replacement therapy (postmenopausal): Secondary | ICD-10-CM

## 2022-10-08 MED ORDER — SEMAGLUTIDE-WEIGHT MANAGEMENT 1.7 MG/0.75ML ~~LOC~~ SOAJ: 1.7000 mg | SUBCUTANEOUS | 0 refills | Status: DC

## 2022-10-08 MED ORDER — SEMAGLUTIDE-WEIGHT MANAGEMENT 1.7 MG/0.75ML ~~LOC~~ SOAJ: 1.7000 mg | SUBCUTANEOUS | 2 refills | Status: DC

## 2022-10-08 MED ORDER — ATORVASTATIN CALCIUM 10 MG PO TABS: 10.0000 mg | ORAL_TABLET | Freq: Every day | ORAL | 1 refills | Status: DC

## 2022-10-08 MED ORDER — TRIAMTERENE-HCTZ 37.5-25 MG PO TABS: 0.5000 | ORAL_TABLET | Freq: Every day | ORAL | 0 refills | Status: DC

## 2022-10-08 MED ORDER — ESTRADIOL 0.5 MG PO TABS: 0.5000 mg | ORAL_TABLET | Freq: Every day | ORAL | 3 refills | Status: DC

## 2022-10-08 NOTE — Assessment & Plan Note (Signed)
Followoing the loss of her brother in August .  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

## 2022-10-08 NOTE — Assessment & Plan Note (Addendum)
Rechecking thyroid functio. Episodes sound like PVCs and are not accompanied by presyncope , vertigo, chest pain or dyspnea.  Awaiting results of ZIO monitor.  Reviewed caffeine intake and advised that 50% reduction may be needed

## 2022-10-08 NOTE — Assessment & Plan Note (Signed)
Discussed a gradual wean.  She prefers to remain on medication current dose for now

## 2022-10-08 NOTE — Patient Instructions (Signed)
I am still waiting for the results of your heart monitor  Rechecking thyroid function today since the last level suggested a relative increase in your thyroid activity

## 2022-10-08 NOTE — Assessment & Plan Note (Signed)
Managed with Wegovy,  and regular exercise ,  incrase dose today for weight plateau

## 2022-10-08 NOTE — Progress Notes (Signed)
Subjective:  Patient ID: Sarah Delgado, female    DOB: 06/24/1956  Age: 67 y.o. MRN: [id number]  CC: The primary encounter diagnosis was Palpitations. Diagnoses of Essential hypertension and Grief reaction with prolonged bereavement were also pertinent to this visit.   HPI Sarah Delgado presents for  Chief Complaint  Patient presents with   Medical Management of Chronic Issues    4 week follow up    1) arrhythmia:  patient wore a ZIO Monitor recently . Results not in chart yet.  Finished wearing the monitor on feb 25 and sent it back on Feb 26 ,she has several symptomatic episodes of a skipped beat occurring at rest , described as a thump,  accompanied by hearing her pulse in her ear  for a few minutes, without dyspnea ,  dizziness and chest pain  2) Grief over loss of brother:  improving. Staying  busy but still grieves daily . 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 Hasn't exercises regularly since brother died.  Water aerobics . Weight has plateaued o n the 1 mg dose    Outpatient Medications Prior to Visit  Medication Sig Dispense Refill   aspirin EC 81 MG tablet Take 81 mg by mouth daily.     Biotin 1000 MCG tablet Take 1,000 mcg by mouth daily.      Cholecalciferol (VITAMIN D-3) 1000 units CAPS Take 1 capsule by mouth daily.     Coenzyme Q10 10 MG capsule Take 10 mg by mouth daily.     vitamin B-12 (CYANOCOBALAMIN) 500 MCG tablet Take 500 mcg daily by mouth.     vitamin E 180 MG (400 UNITS) capsule Take 400 Units daily by mouth.     atorvastatin (LIPITOR) 10 MG tablet TAKE 1 TABLET BY MOUTH EVERY DAY 90 tablet 1   estradiol (ESTRACE) 0.5 MG tablet Take 2 tablets (1 mg total) by mouth daily. 90 tablet 0   triamterene-hydrochlorothiazide (MAXZIDE-25) 37.5-25 MG tablet Take 0.5 tablets by mouth daily. 45 tablet  0   WEGOVY 1 MG/0.5ML SOAJ Inject 1 mg into the skin once a week.     meloxicam (MOBIC) 7.5 MG tablet Take 1 tablet (7.5 mg total) by mouth daily as needed for pain. (Patient not taking: Reported on 10/08/2022) 30 tablet 1   No facility-administered medications prior to visit.    Review of Systems;  Patient denies headache, fevers, malaise, unintentional weight loss, skin rash, eye pain, sinus congestion and sinus pain, sore throat, dysphagia,  hemoptysis , cough, dyspnea, wheezing, chest pain, palpitations, orthopnea, edema, abdominal pain, nausea, melena, diarrhea, constipation, flank pain, dysuria, hematuria, urinary  Frequency, nocturia, numbness, tingling, seizures,  Focal weakness, Loss of consciousness,  Tremor, insomnia, depression, anxiety, and suicidal ideation.      Objective:  BP 116/72   Pulse 65   Temp 98.3 F (36.8 C) (Oral)   Ht '5\' 6"'$  (1.676 m)   Wt 169 lb 6.4 oz (76.8 kg)   SpO2 95%   BMI 27.34 kg/m   BP Readings from Last 3 Encounters:  10/08/22 116/72  09/05/22 118/78  07/27/22 (!) 150/82    Wt Readings from Last 3 Encounters:  10/08/22 169 lb 6.4 oz (76.8 kg)  09/05/22 167 lb 9.6 oz (76 kg)  07/14/22 170 lb 6.4 oz (77.3 kg)  Physical Exam Vitals reviewed.  Constitutional:      General: She is not in acute distress.    Appearance: Normal appearance. She is normal weight. She is not ill-appearing, toxic-appearing or diaphoretic.  HENT:     Head: Normocephalic.  Eyes:     General: No scleral icterus.       Right eye: No discharge.        Left eye: No discharge.     Conjunctiva/sclera: Conjunctivae normal.  Cardiovascular:     Rate and Rhythm: Normal rate and regular rhythm.     Heart sounds: Normal heart sounds.  Pulmonary:     Effort: Pulmonary effort is normal. No respiratory distress.     Breath sounds: Normal breath sounds.  Musculoskeletal:        General: Normal range of motion.  Skin:    General: Skin is warm and dry.  Neurological:      General: No focal deficit present.     Mental Status: She is alert and oriented to person, place, and time. Mental status is at baseline.  Psychiatric:        Mood and Affect: Mood normal.        Behavior: Behavior normal.        Thought Content: Thought content normal.        Judgment: Judgment normal.     Lab Results  Component Value Date   HGBA1C 5.8 12/02/2021   HGBA1C 6.1 06/05/2021   HGBA1C 5.9 11/12/2020    Lab Results  Component Value Date   CREATININE 1.05 09/05/2022   CREATININE 0.97 12/02/2021   CREATININE 0.96 09/09/2021    Lab Results  Component Value Date   WBC 6.7 09/05/2022   HGB 15.2 09/05/2022   HCT 43.7 09/05/2022   PLT 362 09/05/2022   GLUCOSE 81 09/05/2022   CHOL 214 (H) 12/02/2021   TRIG 127.0 12/02/2021   HDL 72.80 12/02/2021   LDLDIRECT 122.0 12/02/2021   LDLCALC 116 (H) 12/02/2021   ALT 11 09/05/2022   AST 15 09/05/2022   NA 135 09/05/2022   K 4.0 09/05/2022   CL 98 09/05/2022   CREATININE 1.05 09/05/2022   BUN 18 09/05/2022   CO2 25 09/05/2022   TSH 0.67 09/05/2022   HGBA1C 5.8 12/02/2021   MICROALBUR <0.7 06/11/2017    No results found.  Assessment & Plan:  .Palpitations Assessment & Plan: Episodes sound like PVCs and are not accompanied by presyncope , vertiog,  or dyspnea.  Awaiting results of ZIO monitor.  Reviewed caffeine intake and advised that 50% reduction may be needed   Orders: -     Thyroid Panel With TSH  Essential hypertension -     Triamterene-HCTZ; Take 0.5 tablets by mouth daily.  Dispense: 45 tablet; Refill: 0  Grief reaction with prolonged bereavement Assessment & Plan: Followoing the loss of her brother in August .  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    Other orders -     Atorvastatin Calcium; Take 1 tablet (10 mg total) by mouth daily.  Dispense: 90 tablet; Refill: 1 -     Estradiol; Take 1 tablet  (0.5 mg total) by mouth daily.  Dispense: 90 tablet; Refill: 3 -     Semaglutide-Weight Management; Inject 1.7 mg into the skin once a week for 28 days.  Dispense: 3 mL; Refill: 0  I provided 30 minutes of face-to-face time during this encounter reviewing patient's last visit with me, patient's  most recent visit with cardiology,  nephrology,  and neurology,  recent surgical and non surgical procedures, previous  labs and imaging studies, counseling on currently addressed issues,  and post visit ordering to diagnostics and therapeutics .   Follow-up: Return in about 6 months (around 04/10/2023).   Crecencio Mc, MD

## 2022-10-09 LAB — THYROID PANEL WITH TSH
Free Thyroxine Index: 2.7 (ref 1.4–3.8)
T3 Uptake: 25 % (ref 22–35)
T4, Total: 10.6 ug/dL (ref 5.1–11.9)
TSH: 1.21 mIU/L (ref 0.40–4.50)

## 2022-10-14 ENCOUNTER — Other Ambulatory Visit: Payer: Self-pay | Admitting: Internal Medicine

## 2022-10-14 ENCOUNTER — Encounter: Payer: Self-pay | Admitting: Internal Medicine

## 2022-10-14 DIAGNOSIS — R002 Palpitations: Secondary | ICD-10-CM

## 2022-10-14 DIAGNOSIS — I493 Ventricular premature depolarization: Secondary | ICD-10-CM

## 2022-11-02 IMAGING — MG MM DIGITAL SCREENING BILAT W/ TOMO AND CAD
8 series · 8 of 24 positions shown · non-contrast
Comparison: Previous exam(s).

CLINICAL DATA: Screening.

EXAM:
DIGITAL SCREENING BILATERAL MAMMOGRAM WITH TOMOSYNTHESIS AND CAD
TECHNIQUE: Bilateral screening digital craniocaudal and mediolateral oblique
mammograms were obtained. Bilateral screening digital breast
tomosynthesis was performed. The images were evaluated with
computer-aided detection.

[R CC synth-2D]
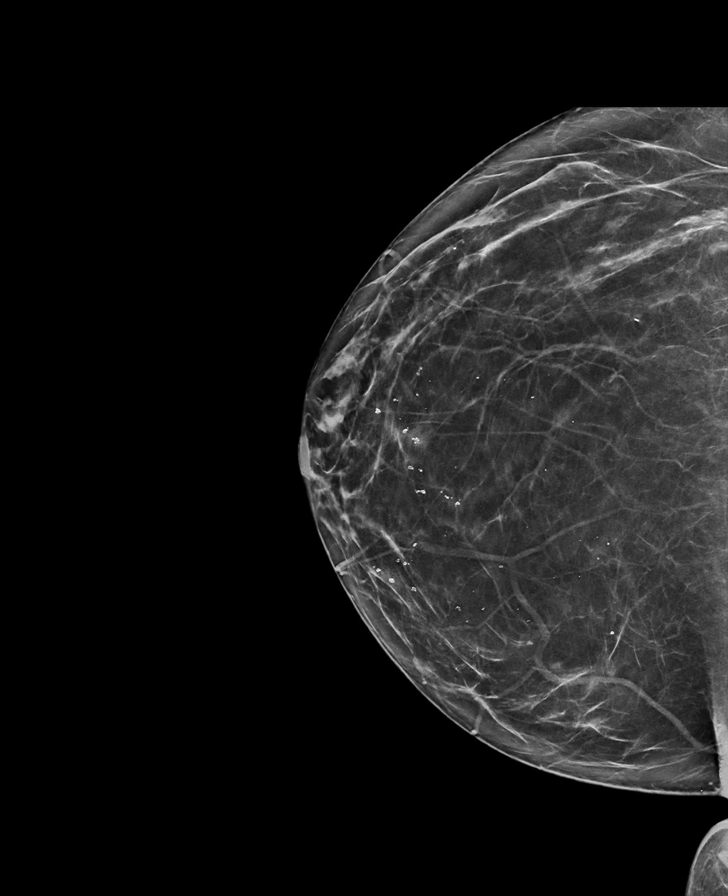

[L CC synth-2D]
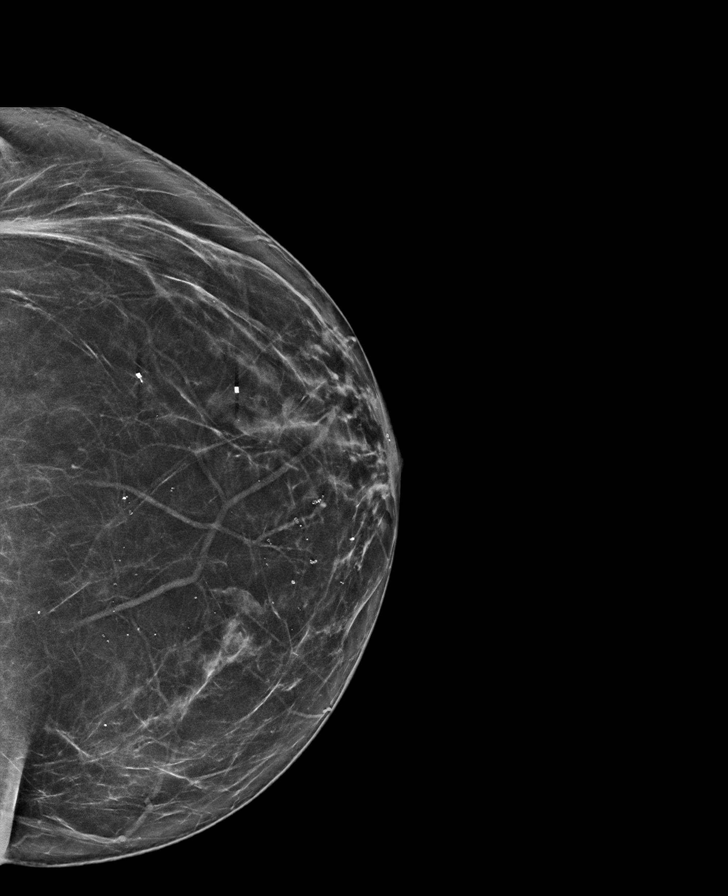

[L MLO synth-2D]
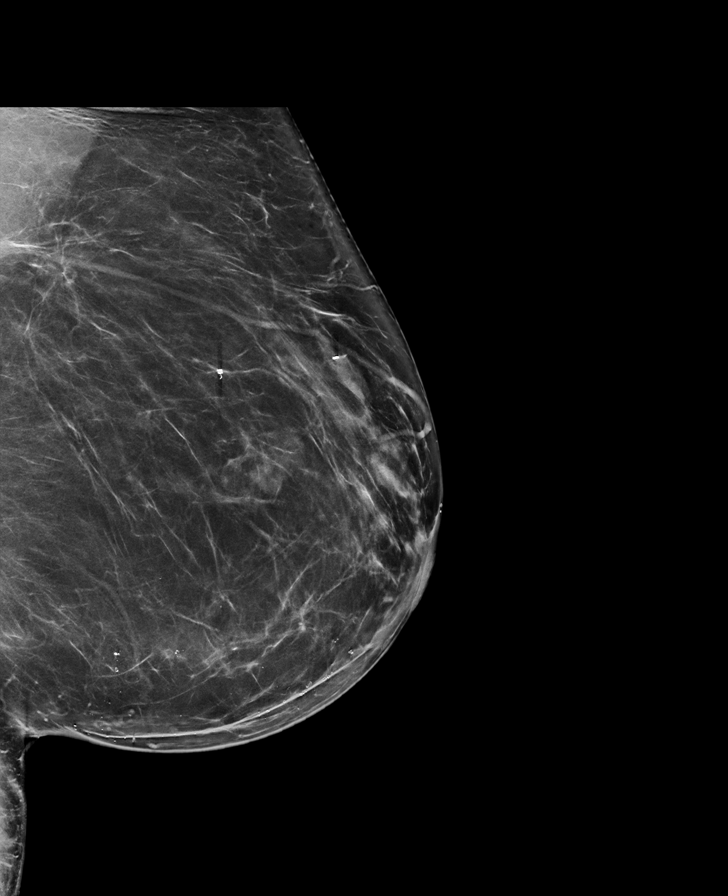

[R MLO synth-2D]
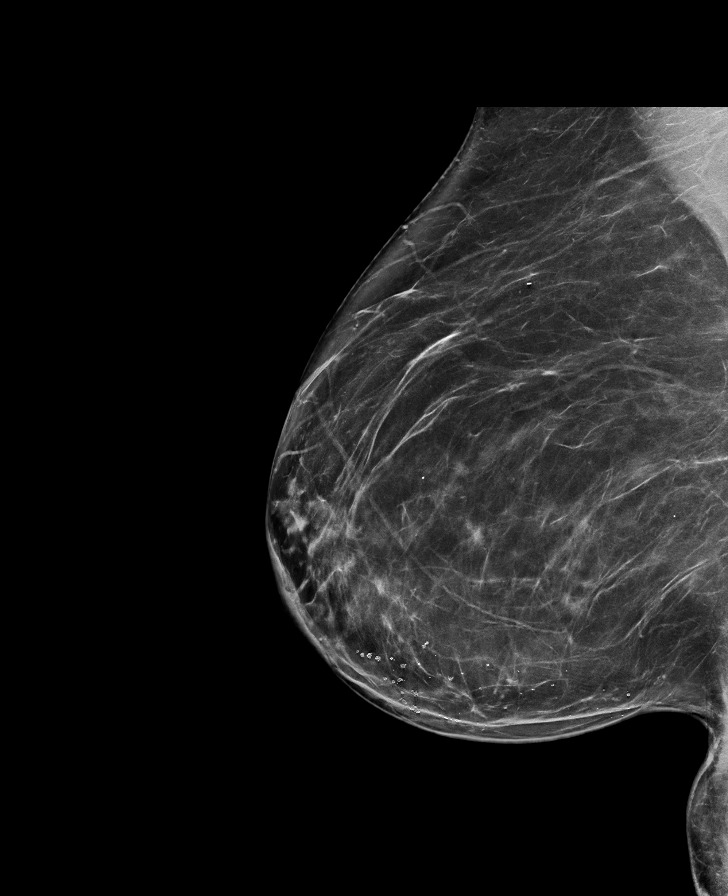

[L CC tomo · tomo slice 37/74.0]
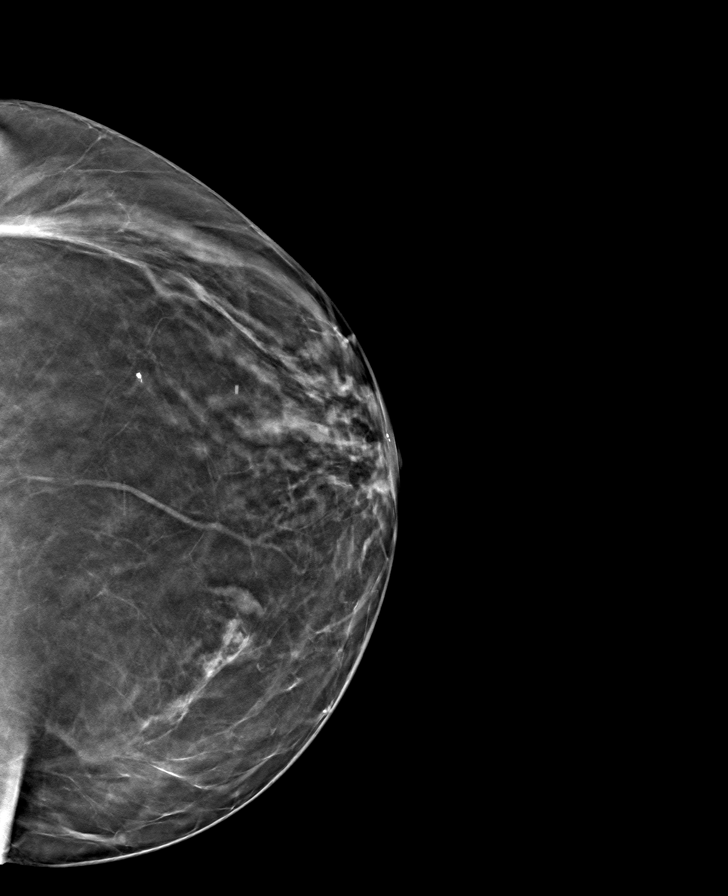

[R MLO tomo · tomo slice 41/80.0]
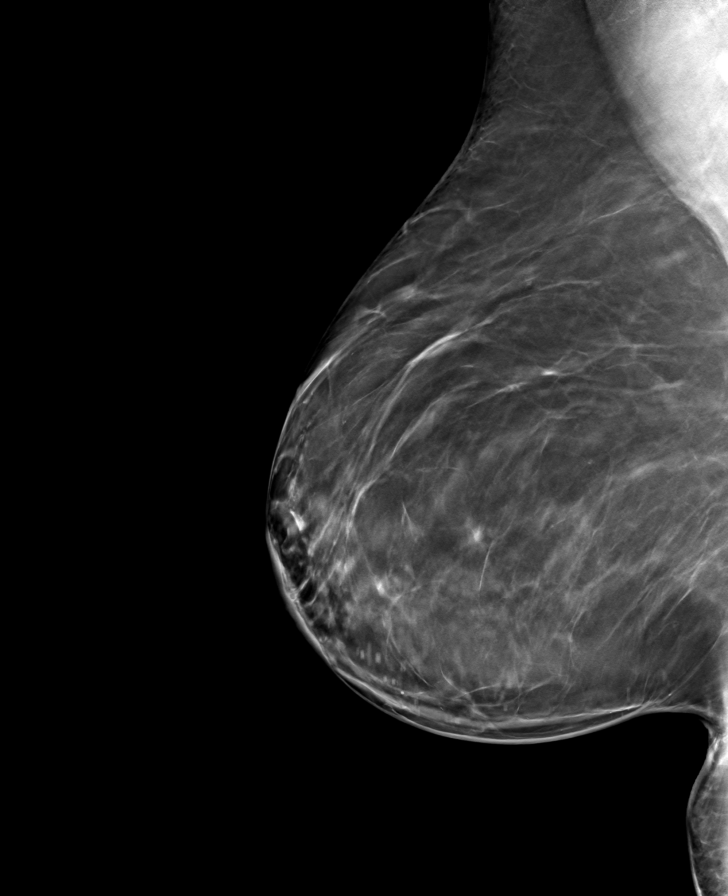

[L MLO tomo · tomo slice 42/83.0]
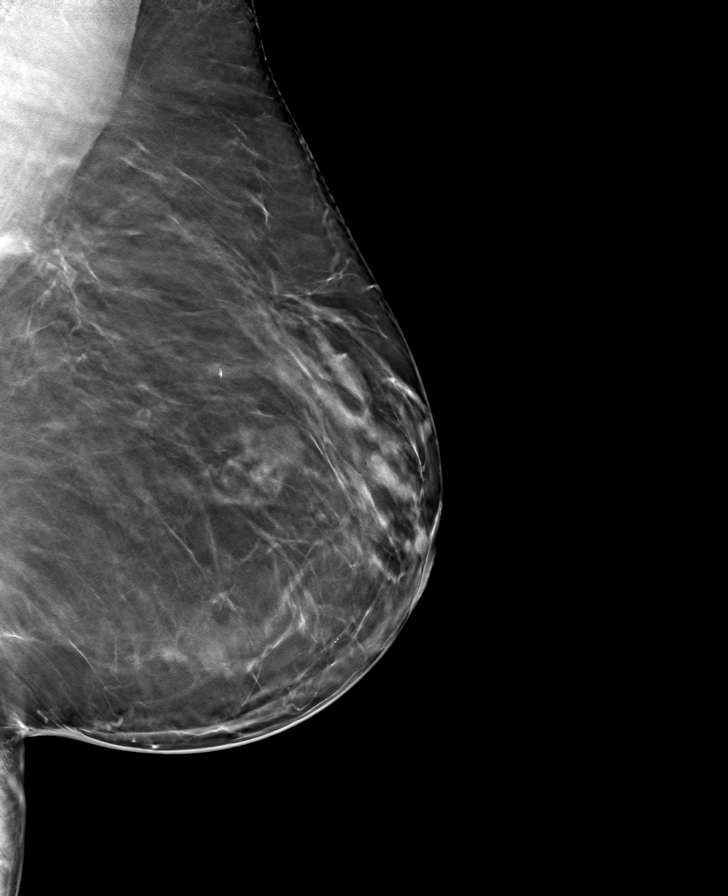

[R CC tomo · tomo slice 35/69.0]
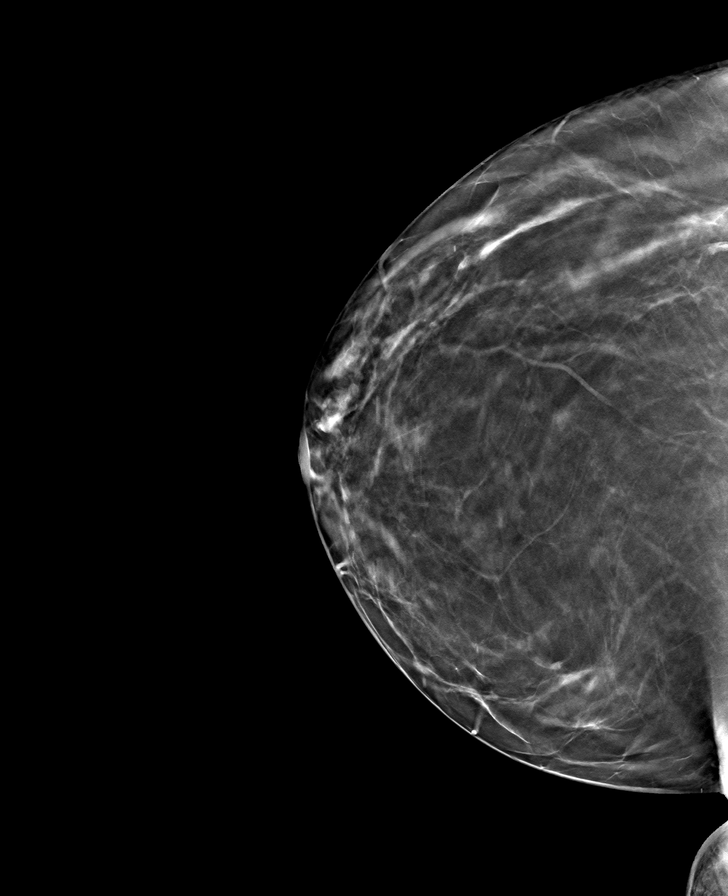

[8 of 24 positions shown; findings below may reference images not displayed]

ACR Breast Density Category b: There are scattered areas of
fibroglandular density.
FINDINGS: Reduction mammoplasty changes are noted in both breasts. There are
no findings suspicious for malignancy in either breast.
IMPRESSION: No mammographic evidence of malignancy. A result letter of this
screening mammogram will be mailed directly to the patient.

RECOMMENDATION:
Screening mammogram in one year. (Code:[id number])

BI-RADS CATEGORY  2: Benign.

## 2022-11-11 ENCOUNTER — Encounter: Payer: Self-pay | Admitting: Internal Medicine

## 2022-11-11 ENCOUNTER — Other Ambulatory Visit: Payer: Self-pay | Admitting: Internal Medicine

## 2022-11-11 DIAGNOSIS — Z1231 Encounter for screening mammogram for malignant neoplasm of breast: Secondary | ICD-10-CM

## 2022-11-18 ENCOUNTER — Other Ambulatory Visit: Payer: Self-pay | Admitting: Internal Medicine

## 2022-11-27 ENCOUNTER — Other Ambulatory Visit: Payer: Self-pay | Admitting: Internal Medicine

## 2022-12-01 ENCOUNTER — Ambulatory Visit: Payer: Medicare PPO | Attending: Cardiology | Admitting: Cardiology

## 2022-12-01 ENCOUNTER — Encounter: Payer: Self-pay | Admitting: Cardiology

## 2022-12-01 VITALS — BP 104/76 | HR 69 | Ht 67.0 in | Wt 162.6 lb

## 2022-12-01 DIAGNOSIS — E7800 Pure hypercholesterolemia, unspecified: Secondary | ICD-10-CM

## 2022-12-01 DIAGNOSIS — I493 Ventricular premature depolarization: Secondary | ICD-10-CM | POA: Diagnosis not present

## 2022-12-01 DIAGNOSIS — F17200 Nicotine dependence, unspecified, uncomplicated: Secondary | ICD-10-CM | POA: Diagnosis not present

## 2022-12-01 NOTE — Patient Instructions (Signed)
Medication Instructions:   Your physician recommends that you continue on your current medications as directed. Please refer to the Current Medication list given to you today.  *If you need a refill on your cardiac medications before your next appointment, please call your pharmacy*   Lab Work:  None Ordered  If you have labs (blood work) drawn today and your tests are completely normal, you will receive your results only by: MyChart Message (if you have MyChart) OR A paper copy in the mail If you have any lab test that is abnormal or we need to change your treatment, we will call you to review the results.   Testing/Procedures:  Your physician has requested that you have an echocardiogram. Echocardiography is a painless test that uses sound waves to create images of your heart. It provides your doctor with information about the size and shape of your heart and how well your heart's chambers and valves are working. This procedure takes approximately one hour. There are no restrictions for this procedure. Please do NOT wear cologne, perfume, aftershave, or lotions (deodorant is allowed). Please arrive 15 minutes prior to your appointment time.    Follow-Up: At [facility], you and your health needs are our priority.  As part of our continuing mission to provide you with exceptional heart care, we have created designated Provider Care Teams.  These Care Teams include your primary Cardiologist (physician) and Advanced Practice Providers (APPs -  Physician Assistants and Nurse Practitioners) who all work together to provide you with the care you need, when you need it.  We recommend signing up for the patient portal called "MyChart".  Sign up information is provided on this After Visit Summary.  MyChart is used to connect with patients for Virtual Visits (Telemedicine).  Patients are able to view lab/test results, encounter notes, upcoming appointments, etc.  Non-urgent messages can  be sent to your provider as well.   To learn more about what you can do with MyChart, go to [email or web address].    Your next appointment:    After Echocardiogram  Provider:   You may see Brian Agbor-Etang, MD or one of the following Advanced Practice Providers on your designated Care Team:   Christopher Berge, NP Ryan Dunn, PA-C Cadence Furth, PA-C Sheri Hammock, NP 

## 2022-12-01 NOTE — Progress Notes (Signed)
Cardiology Office Note:    Date:  12/01/2022   ID:  Sarah Delgado, DOB 04/08/1956, MRN [id number]  PCP:  Sherlene Shams, MD   [facility] Providers Cardiologist:  Debbe Odea, MD     Referring MD: Sherlene Shams, MD   Chief Complaint  Patient presents with   New Patient (Initial Visit)    Patient feels heart "pounding" in her chest evaluated by PCP who ordered Zio monitor, 10/13/22 results in chart.  Has not felt the pounding feeling in 1 month.    History of Present Illness:    Sarah Delgado is a 67 y.o. female with a hx of hyperlipidemia, current smoker who presents due to palpitations.  She states having symptoms of heart pounding roughly 3 months ago.  Symptoms went on for about 6 weeks, occurring almost daily.  She saw her primary care physician, cardiac monitor was placed.  She endorses drinking about 3 to 4 cups of coffee daily, endorses being a social cigarette smoker.  Has some stressors in her life with 3 close family members passing in a short period of time.  She thinks this might have contributed.  Symptoms overall have improved, states being asymptomatic over the past month.  Cardiac monitor 10/2022 showed frequent PVCs 10.4% burden, no sustained arrhythmias. Echo 09/2018 EF 60 to 65%  Past Medical History:  Diagnosis Date   Chicken pox    Hyperlipidemia    Hypertension    Tobacco abuse 09/16/2012   Social smoker on weekends.      Past Surgical History:  Procedure Laterality Date   ABDOMINAL HYSTERECTOMY     BREAST BIOPSY Left 12/14/2020   cylinder marker, BENIGN BREAST TISSUE WITH: FIBROCYSTIC CHANGES WITH ASSOCIATED CALCIFICATIONS, USUAL DUCTAL HYPERPLASIA , DENSE STROMAL FIBROSIS, NEGATIVE FOR AYPTIA OR MALIGNANCY   BREAST SURGERY  2000   reduction   COLONOSCOPY WITH PROPOFOL N/A 12/27/2019   Procedure: COLONOSCOPY WITH PROPOFOL;  Surgeon: Wyline Mood, MD;  Location: [facility];  Service: Gastroenterology;  Laterality: N/A;    REDUCTION MAMMAPLASTY Bilateral    at least 15 years ago   TOTAL ABDOMINAL HYSTERECTOMY W/ BILATERAL SALPINGOOPHORECTOMY  2005    Current Medications: Current Meds  Medication Sig   aspirin EC 81 MG tablet Take 81 mg by mouth daily.   atorvastatin (LIPITOR) 10 MG tablet TAKE 1 TABLET BY MOUTH EVERY DAY   Biotin 1000 MCG tablet Take 1,000 mcg by mouth daily.    Cholecalciferol (VITAMIN D-3) 1000 units CAPS Take 1 capsule by mouth daily.   Coenzyme Q10 10 MG capsule Take 10 mg by mouth daily.   estradiol (ESTRACE) 0.5 MG tablet Take 1 tablet (0.5 mg total) by mouth daily.   Semaglutide-Weight Management (WEGOVY) 1.7 MG/0.75ML SOAJ INJECT 1.7 MG INTO THE SKIN ONCE A WEEK FOR 28 DAYS.   triamterene-hydrochlorothiazide (MAXZIDE-25) 37.5-25 MG tablet Take 0.5 tablets by mouth daily.   vitamin B-12 (CYANOCOBALAMIN) 500 MCG tablet Take 500 mcg daily by mouth.   vitamin E 180 MG (400 UNITS) capsule Take 400 Units daily by mouth.     Allergies:   Patient has no known allergies.   Social History   Socioeconomic History   Marital status: Divorced    Spouse name: Not on file   Number of children: Not on file   Years of education: 16 plus   Highest education level: Not on file  Occupational History   Occupation: educator    Employer: [employer]  Tobacco Use  Smoking status: Some Days    Types: Cigarettes    Start date: 11/18/2016   Smokeless tobacco: Never  Vaping Use   Vaping Use: Never used  Substance and Sexual Activity   Alcohol use: Yes    Comment: OCC wine   Drug use: No   Sexual activity: Yes    Partners: Male  Other Topics Concern   Not on file  Social History Narrative   Programmer, systems, works in [employer] school system   Social Determinants of Health   Financial Resource Strain: Low Risk  (03/17/2022)   Overall Financial Resource Strain (CARDIA)    Difficulty of Paying Living Expenses: Not hard at all  Food Insecurity: No Food Insecurity (03/17/2022)    Hunger Vital Sign    Worried About Running Out of Food in the Last Year: Never true    Ran Out of Food in the Last Year: Never true  Transportation Needs: No Transportation Needs (03/17/2022)   PRAPARE - Administrator, Civil Service (Medical): No    Lack of Transportation (Non-Medical): No  Physical Activity: Not on file  Stress: No Stress Concern Present (03/17/2022)   [employer] of Occupational Health - Occupational Stress Questionnaire    Feeling of Stress : Not at all  Social Connections: Not on file     Family History: The patient's family history includes Arthritis in her mother; COPD in her sister; Cancer in her brother and maternal aunt; Diabetes in her brother, brother, and sister; Emphysema in her father; Heart disease in her maternal grandfather; Hypertension in her brother, brother, brother, brother, and mother; Kidney disease in her sister; Mental illness in her mother; Stroke in her brother. There is no history of Breast cancer.  ROS:   Please see the history of present illness.     All other systems reviewed and are negative.  EKGs/Labs/Other Studies Reviewed:    The following studies were reviewed today:   EKG:  EKG is  ordered today.  The ekg ordered today demonstrates normal sinus rhythm, heart rate 69  Recent Labs: 09/05/2022: ALT 11; BUN 18; Creat 1.05; Hemoglobin 15.2; Magnesium 2.0; Platelets 362; Potassium 4.0; Sodium 135 10/08/2022: TSH 1.21  Recent Lipid Panel    Component Value Date/Time   CHOL 214 (H) 12/02/2021 0850   CHOL 225 (H) 11/10/2017 0830   TRIG 127.0 12/02/2021 0850   HDL 72.80 12/02/2021 0850   HDL 62 11/10/2017 0830   CHOLHDL 3 12/02/2021 0850   VLDL 25.4 12/02/2021 0850   LDLCALC 116 (H) 12/02/2021 0850   LDLCALC 144 (H) 11/10/2017 0830   LDLDIRECT 122.0 12/02/2021 0850     Risk Assessment/Calculations:             Physical Exam:    VS:  BP 104/76 (BP Location: Right Arm, Patient Position: Sitting, Cuff Size:  Normal)   Pulse 69   Ht 5\' 7"  (1.702 m)   Wt 162 lb 9.6 oz (73.8 kg)   SpO2 99%   BMI 25.47 kg/m     Wt Readings from Last 3 Encounters:  12/01/22 162 lb 9.6 oz (73.8 kg)  10/08/22 169 lb 6.4 oz (76.8 kg)  09/05/22 167 lb 9.6 oz (76 kg)     GEN:  Well nourished, well developed in no acute distress HEENT: Normal NECK: No JVD; No carotid bruits CARDIAC: RRR, no murmurs, rubs, gallops RESPIRATORY:  Clear to auscultation without rales, wheezing or rhonchi  ABDOMEN: Soft, non-tender, non-distended MUSCULOSKELETAL:  No edema; No  deformity  SKIN: Warm and dry NEUROLOGIC:  Alert and oriented x 3 PSYCHIATRIC:  Normal affect   ASSESSMENT:    1. Frequent PVCs   2. Smoking   3. Pure hypercholesterolemia    PLAN:    In order of problems listed above:  Frequent PVCs, 10.4% burden.  Symptoms of heart pounding improved over the past month.  Get echo to rule out any significant structural abnormalities.  Patient advised to cut back on caffeine and smoking.  Stress may also be contributing.  Monitor off AV nodal agents pending echocardiogram as her symptoms overall have improved. Current smoker, smoking cessation advised. Hyperlipidemia, Lipitor 10 mg daily  Follow-up after echo      Medication Adjustments/Labs and Tests Ordered: Current medicines are reviewed at length with the patient today.  Concerns regarding medicines are outlined above.  Orders Placed This Encounter  Procedures   EKG 12-Lead   ECHOCARDIOGRAM COMPLETE   No orders of the defined types were placed in this encounter.   Patient Instructions  Medication Instructions:   Your physician recommends that you continue on your current medications as directed. Please refer to the Current Medication list given to you today.  *If you need a refill on your cardiac medications before your next appointment, please call your pharmacy*   Lab Work:  None Ordered  If you have labs (blood work) drawn today and your tests  are completely normal, you will receive your results only by: MyChart Message (if you have MyChart) OR A paper copy in the mail If you have any lab test that is abnormal or we need to change your treatment, we will call you to review the results.   Testing/Procedures:  Your physician has requested that you have an echocardiogram. Echocardiography is a painless test that uses sound waves to create images of your heart. It provides your doctor with information about the size and shape of your heart and how well your heart's chambers and valves are working. This procedure takes approximately one hour. There are no restrictions for this procedure. Please do NOT wear cologne, perfume, aftershave, or lotions (deodorant is allowed). Please arrive 15 minutes prior to your appointment time.    Follow-Up: At [facility], you and your health needs are our priority.  As part of our continuing mission to provide you with exceptional heart care, we have created designated Provider Care Teams.  These Care Teams include your primary Cardiologist (physician) and Advanced Practice Providers (APPs -  Physician Assistants and Nurse Practitioners) who all work together to provide you with the care you need, when you need it.  We recommend signing up for the patient portal called "MyChart".  Sign up information is provided on this After Visit Summary.  MyChart is used to connect with patients for Virtual Visits (Telemedicine).  Patients are able to view lab/test results, encounter notes, upcoming appointments, etc.  Non-urgent messages can be sent to your provider as well.   To learn more about what you can do with MyChart, go to [email or web address].    Your next appointment:    After Echocardiogram  Provider:   You may see Debbe Odea, MD or one of the following Advanced Practice Providers on your designated Care Team:   Nicolasa Ducking, NP Eula Listen, PA-C Cadence Fransico Michael, PA-C Charlsie Quest, NP   Signed, Debbe Odea, MD  12/01/2022 11:33 AM    [facility]

## 2022-12-31 ENCOUNTER — Ambulatory Visit: Payer: Medicare PPO | Attending: Cardiology

## 2022-12-31 DIAGNOSIS — I493 Ventricular premature depolarization: Secondary | ICD-10-CM | POA: Diagnosis not present

## 2022-12-31 LAB — ECHOCARDIOGRAM COMPLETE
AR max vel: 2.69 cm2
AV Area VTI: 2.7 cm2
AV Area mean vel: 3.02 cm2
AV Mean grad: 2 mmHg
AV Peak grad: 4.1 mmHg
Ao pk vel: 1.01 m/s
Area-P 1/2: 3.08 cm2
S' Lateral: 2.9 cm

## 2022-12-31 NOTE — Progress Notes (Unsigned)
Cardiology Office Note    Date:  01/01/2023   ID:  Delgado, Sarah May 30, 1956, MRN [id number]  PCP:  Sherlene Shams, MD  Cardiologist:  Debbe Odea, MD  Electrophysiologist:  None   Chief Complaint: Follow up  History of Present Illness:   Sarah Delgado is a 67 y.o. female with history of frequent PVCs, HTN, and HLD who presents for follow-up of echo.  Echo in 09/2018 showed an EF of 60 to 65%, normal LV diastolic function parameters, normal RV systolic function, ventricular cavity size, and PASP.  She was evaluated as a new patient by Dr. Azucena Cecil in 11/2022 for evaluation of palpitations.  She had previously undergone Zio patch through PCPs office in 09/2022, that showed a predominant rhythm of sinus with an average rate of 73 bpm (range 54 to 127 bpm), frequent PVCs with a burden of 10.4% with rare supraventricular ectopy.  No sustained arrhythmias.  She reported drinking 3 to 4 cups of coffee daily smoking cigarettes and socially.  There was also increased stress following the passing of 3 close family members in a short period of time.  At the time of establishing with our office, she reported her symptoms had improved some.  Echo on 12/31/2022 showed an EF of 60 to 65%, no regional wall motion abnormalities, grade 1 diastolic dysfunction, normal RV systolic function and ventricular cavity size, no significant valvular abnormalities, and an estimated right atrial pressure of 3 mmHg.  She comes in doing well from a cardiac perspective and is without symptoms of angina or cardiac decompensation.  She reports she initially began to note these palpitations following the passing of 3 family members throughout late 2022 into the summer 2023.  Symptoms were initially noted when walking.  She noticed these palpitations are more noticeable when she is exerting herself with walking, and at times occur randomly.  No dizziness, presyncope, or syncope.  She is without frank angina.  She  does note some mild increase in shortness of breath with these palpitations, and with ambulation.  No lower extremity swelling.   Labs independently reviewed: 10/2022 - TSH normal 09/2022 - BUN 18, serum creatinine 1.05, potassium 4.0, albumin 4.7, AST/ALT normal, Hgb 15.2, PLT 362, magnesium 2.0 12/2021 - TC 214, TG 127, HDL 72, LDL 116, A1c 5.8  Past Medical History:  Diagnosis Date   Chicken pox    Hyperlipidemia    Hypertension    Tobacco abuse 09/16/2012   Social smoker on weekends.      Past Surgical History:  Procedure Laterality Date   ABDOMINAL HYSTERECTOMY     BREAST BIOPSY Left 12/14/2020   cylinder marker, BENIGN BREAST TISSUE WITH: FIBROCYSTIC CHANGES WITH ASSOCIATED CALCIFICATIONS, USUAL DUCTAL HYPERPLASIA , DENSE STROMAL FIBROSIS, NEGATIVE FOR AYPTIA OR MALIGNANCY   BREAST SURGERY  2000   reduction   COLONOSCOPY WITH PROPOFOL N/A 12/27/2019   Procedure: COLONOSCOPY WITH PROPOFOL;  Surgeon: Wyline Mood, MD;  Location[facility] ENDOSCOPY;  Service: Gastroenterology;  Laterality: N/A;   REDUCTION MAMMAPLASTY Bilateral    at least 15 years ago   TOTAL ABDOMINAL HYSTERECTOMY W/ BILATERAL SALPINGOOPHORECTOMY  2005    Current Medications: Current Meds  Medication Sig   aspirin EC 81 MG tablet Take 81 mg by mouth daily.   atorvastatin (LIPITOR) 10 MG tablet TAKE 1 TABLET BY MOUTH EVERY DAY   Biotin 1000 MCG tablet Take 1,000 mcg by mouth daily.    Cholecalciferol (VITAMIN D-3) 1000 units CAPS Take 1 capsule by  mouth daily.   Coenzyme Q10 10 MG capsule Take 10 mg by mouth daily.   estradiol (ESTRACE) 0.5 MG tablet Take 1 tablet (0.5 mg total) by mouth daily.   Semaglutide-Weight Management (WEGOVY) 1.7 MG/0.75ML SOAJ INJECT 1.7 MG INTO THE SKIN ONCE A WEEK FOR 28 DAYS.   triamterene-hydrochlorothiazide (MAXZIDE-25) 37.5-25 MG tablet Take 0.5 tablets by mouth daily.   vitamin B-12 (CYANOCOBALAMIN) 500 MCG tablet Take 500 mcg daily by mouth.   vitamin E 180 MG (400 UNITS)  capsule Take 400 Units daily by mouth.    Allergies:   Patient has no known allergies.   Social History   Socioeconomic History   Marital status: Divorced    Spouse name: Not on file   Number of children: Not on file   Years of education: 16 plus   Highest education level: Not on file  Occupational History   Occupation: educator    Employer:  [employer]  Tobacco Use   Smoking status: Some Days    Types: Cigarettes    Start date: 11/18/2016   Smokeless tobacco: Never  Vaping Use   Vaping Use: Never used  Substance and Sexual Activity   Alcohol use: Yes    Comment: OCC wine   Drug use: No   Sexual activity: Yes    Partners: Male  Other Topics Concern   Not on file  Social History Narrative   Programmer, systems, works in [employer] school system   Social Determinants of Health   Financial Resource Strain: Low Risk  (03/17/2022)   Overall Financial Resource Strain (CARDIA)    Difficulty of Paying Living Expenses: Not hard at all  Food Insecurity: No Food Insecurity (03/17/2022)   Hunger Vital Sign    Worried About Running Out of Food in the Last Year: Never true    Ran Out of Food in the Last Year: Never true  Transportation Needs: No Transportation Needs (03/17/2022)   PRAPARE - Administrator, Civil Service (Medical): No    Lack of Transportation (Non-Medical): No  Physical Activity: Not on file  Stress: No Stress Concern Present (03/17/2022)   [employer] of Occupational Health - Occupational Stress Questionnaire    Feeling of Stress : Not at all  Social Connections: Not on file     Family History:  The patient's family history includes Arthritis in her mother; COPD in her sister; Cancer in her brother and maternal aunt; Diabetes in her brother, brother, and sister; Emphysema in her father; Heart disease in her maternal grandfather; Hypertension in her brother, brother, brother, brother, and mother; Kidney disease in her sister; Mental illness  in her mother; Stroke in her brother. There is no history of Breast cancer.  ROS:   12-point review of systems is negative unless otherwise noted in the HPI.   EKGs/Labs/Other Studies Reviewed:    Studies reviewed were summarized above. The additional studies were reviewed today:  2D echo 12/31/2022: 1. Left ventricular ejection fraction, by estimation, is 60 to 65%. The  left ventricle has normal function. The left ventricle has no regional  wall motion abnormalities. Left ventricular diastolic parameters are  consistent with Grade I diastolic  dysfunction (impaired relaxation).   2. Right ventricular systolic function is normal. The right ventricular  size is normal.   3. The mitral valve is normal in structure. No evidence of mitral valve  regurgitation.   4. The aortic valve is tricuspid. Aortic valve regurgitation is not  visualized.   5.  The inferior vena cava is normal in size with greater than 50%  respiratory variability, suggesting right atrial pressure of 3 mmHg.   Comparison(s): 09/28/18 60-65%, RVSP 24.  __________  Zio patch 09/2022: HR 54 - 127 bpm, average 73 bpm. Rare supraventricular ectopy. Frequent ventricular ectopy, 10.4%. No sustained arrhythmias.   Dr Darrick Huntsman notified of result. __________  2D echo 09/28/2018: 1. The left ventricle has normal systolic function with an ejection  fraction of 60-65%. The cavity size was normal. Left ventricular diastolic  parameters were normal.   2. The right ventricle has normal systolic function. The cavity was  normal. There is no increase in right ventricular wall thickness. Right  ventricular systolic pressure is normal with an estimated pressure of 24.2  mmHg.   3. The mitral valve is normal in structure.   4. The tricuspid valve is normal in structure.   5. The aortic valve is tricuspid Mild thickening of the aortic valve.   6. The aortic root and ascending aorta are normal in size and structure.   7. The  inferior vena cava was normal in size with <50% respiratory  variability.    EKG:  EKG is not ordered today.    Recent Labs: 09/05/2022: ALT 11; BUN 18; Creat 1.05; Hemoglobin 15.2; Magnesium 2.0; Platelets 362; Potassium 4.0; Sodium 135 10/08/2022: TSH 1.21  Recent Lipid Panel    Component Value Date/Time   CHOL 214 (H) 12/02/2021 0850   CHOL 225 (H) 11/10/2017 0830   TRIG 127.0 12/02/2021 0850   HDL 72.80 12/02/2021 0850   HDL 62 11/10/2017 0830   CHOLHDL 3 12/02/2021 0850   VLDL 25.4 12/02/2021 0850   LDLCALC 116 (H) 12/02/2021 0850   LDLCALC 144 (H) 11/10/2017 0830   LDLDIRECT 122.0 12/02/2021 0850    PHYSICAL EXAM:    VS:  BP 120/80 (BP Location: Left Arm, Patient Position: Sitting, Cuff Size: Normal)   Pulse 77   Ht 5\' 7"  (1.702 m)   Wt 160 lb 4 oz (72.7 kg)   SpO2 98%   BMI 25.10 kg/m   BMI: Body mass index is 25.1 kg/m.  Physical Exam Vitals reviewed.  Constitutional:      Appearance: She is well-developed.  HENT:     Head: Normocephalic and atraumatic.  Eyes:     General:        Right eye: No discharge.        Left eye: No discharge.  Neck:     Vascular: No JVD.  Cardiovascular:     Rate and Rhythm: Normal rate and regular rhythm.     Pulses:          Posterior tibial pulses are 2+ on the right side and 2+ on the left side.     Heart sounds: Normal heart sounds, S1 normal and S2 normal. Heart sounds not distant. No midsystolic click and no opening snap. No murmur heard.    No friction rub.  Pulmonary:     Effort: Pulmonary effort is normal. No respiratory distress.     Breath sounds: Normal breath sounds. No decreased breath sounds, wheezing or rales.  Chest:     Chest wall: No tenderness.  Abdominal:     General: There is no distension.  Musculoskeletal:     Cervical back: Normal range of motion.     Right lower leg: No edema.     Left lower leg: No edema.  Skin:    General: Skin is warm and dry.  Nails: There is no clubbing.  Neurological:      Mental Status: She is alert and oriented to person, place, and time.  Psychiatric:        Speech: Speech normal.        Behavior: Behavior normal.        Thought Content: Thought content normal.        Judgment: Judgment normal.     Wt Readings from Last 3 Encounters:  01/01/23 160 lb 4 oz (72.7 kg)  12/01/22 162 lb 9.6 oz (73.8 kg)  10/08/22 169 lb 6.4 oz (76.8 kg)     ASSESSMENT & PLAN:   Frequent PVCs: Zio patch in 09/2022 showed a burden of 10.4%.  Echo performed earlier this week showed preserved LV systolic function with no significant structural abnormalities.  Symptoms overall improved, last occurring approximately 3 to 4 weeks ago.  However, she has noted an association of symptoms with ambulation.  Schedule treadmill MPI to see if we can reproduce PVCs with exercise and to evaluate for high risk ischemia.  Not currently requiring AV nodal blocking medications.  HLD: LDL 116.  Evaluate for coronary artery calcification/aortic atherosclerosis with MPI.   Shared Decision Making/Informed Consent{  The risks [chest pain, shortness of breath, cardiac arrhythmias, dizziness, blood pressure fluctuations, myocardial infarction, stroke/transient ischemic attack, nausea, vomiting, allergic reaction, radiation exposure, metallic taste sensation and life-threatening complications (estimated to be 1 in 10,000)], benefits (risk stratification, diagnosing coronary artery disease, treatment guidance) and alternatives of a nuclear stress test were discussed in detail with Ms. Fonseca and she agrees to proceed.     Disposition: F/u with Dr. Azucena Cecil or an APP in 2 months.   Medication Adjustments/Labs and Tests Ordered: Current medicines are reviewed at length with the patient today.  Concerns regarding medicines are outlined above. Medication changes, Labs and Tests ordered today are summarized above and listed in the Patient Instructions accessible in Encounters.   Signed, Eula Listen, PA-C 01/01/2023 9:57 AM     [facility] [address] [place], [place] [address] [phone]

## 2023-01-01 ENCOUNTER — Ambulatory Visit: Payer: Medicare PPO | Attending: Physician Assistant | Admitting: Physician Assistant

## 2023-01-01 ENCOUNTER — Encounter: Payer: Self-pay | Admitting: Physician Assistant

## 2023-01-01 VITALS — BP 120/80 | HR 77 | Ht 67.0 in | Wt 160.2 lb

## 2023-01-01 DIAGNOSIS — R0609 Other forms of dyspnea: Secondary | ICD-10-CM | POA: Diagnosis not present

## 2023-01-01 DIAGNOSIS — E782 Mixed hyperlipidemia: Secondary | ICD-10-CM

## 2023-01-01 DIAGNOSIS — I493 Ventricular premature depolarization: Secondary | ICD-10-CM | POA: Diagnosis not present

## 2023-01-01 NOTE — Patient Instructions (Signed)
Medication Instructions:  Your Physician recommend you continue on your current medication as directed.     *If you need a refill on your cardiac medications before your next appointment, please call your pharmacy*   Lab Work: None ordered today   Testing/Procedures: [facility] MYOVIEW  Your Provider has ordered a Stress Test with nuclear imaging. The purpose of this test is to evaluate the blood supply to your heart muscle. This procedure is referred to as a "Non-Invasive Stress Test." This is because other than having an IV started in your vein, nothing is inserted or "invades" your body. Cardiac stress tests are done to find areas of poor blood flow to the heart by determining the extent of coronary artery disease (CAD). Some patients exercise on a treadmill, which naturally increases the blood flow to your heart, while others who are unable to walk on a treadmill due to physical limitations have a pharmacologic/chemical stress agent called Lexiscan. This medicine will mimic walking on a treadmill by temporarily increasing your coronary blood flow.     REPORT TO [facility] MEDICAL MALL ENTRANCE  **Proceed to the 1st desk on the right, REGISTRATION, to check in**  Please note: this test may take anywhere between 2-4 hours to complete    Instructions regarding medication:   You may take all of your regular morning medications the day of your test unless listed below.    How to prepare for your Myoview test:  Do not eat or drink for 6 hours prior to the test No caffeine for 24 hours prior to the test No smoking 24 hours prior to the test. Ladies, please do not wear dresses.  Skirts or pants are appropriate. Please wear a short sleeve shirt. No perfume, cologne or lotion. Wear comfortable walking shoes. No heels!   PLEASE NOTIFY THE OFFICE AT LEAST 24 HOURS IN ADVANCE IF YOU ARE UNABLE TO KEEP YOUR APPOINTMENT.  [phone] AND  PLEASE NOTIFY NUCLEAR MEDICINE AT [facility] AT LEAST 24 HOURS IN  ADVANCE IF YOU ARE UNABLE TO KEEP YOUR APPOINTMENT. [phone]       Follow-Up: At [facility], you and your health needs are our priority.  As part of our continuing mission to provide you with exceptional heart care, we have created designated Provider Care Teams.  These Care Teams include your primary Cardiologist (physician) and Advanced Practice Providers (APPs -  Physician Assistants and Nurse Practitioners) who all work together to provide you with the care you need, when you need it.  We recommend signing up for the patient portal called "MyChart".  Sign up information is provided on this After Visit Summary.  MyChart is used to connect with patients for Virtual Visits (Telemedicine).  Patients are able to view lab/test results, encounter notes, upcoming appointments, etc.  Non-urgent messages can be sent to your provider as well.   To learn more about what you can do with MyChart, go to [email or web address].    Your next appointment:   2 month(s)  Provider:   You may see Debbe Odea, MD or one of the following Advanced Practice Providers on your designated Care Team:   Nicolasa Ducking, NP Eula Listen, PA-C Cadence Fransico Michael, PA-C Charlsie Quest, NP

## 2023-01-07 ENCOUNTER — Ambulatory Visit
Admission: RE | Admit: 2023-01-07 | Discharge: 2023-01-07 | Disposition: A | Discharge status: Home / Self Care | Payer: Medicare PPO | Source: Ambulatory Visit | Attending: Internal Medicine | Admitting: Internal Medicine

## 2023-01-07 DIAGNOSIS — Z1231 Encounter for screening mammogram for malignant neoplasm of breast: Secondary | ICD-10-CM | POA: Diagnosis not present

## 2023-01-09 ENCOUNTER — Encounter
Admission: RE | Admit: 2023-01-09 | Discharge: 2023-01-09 | Disposition: A | Discharge status: Home / Self Care | Payer: Medicare PPO | Source: Ambulatory Visit | Attending: Physician Assistant | Admitting: Physician Assistant

## 2023-01-09 DIAGNOSIS — R0609 Other forms of dyspnea: Secondary | ICD-10-CM | POA: Insufficient documentation

## 2023-01-09 DIAGNOSIS — E782 Mixed hyperlipidemia: Secondary | ICD-10-CM | POA: Insufficient documentation

## 2023-01-09 DIAGNOSIS — I493 Ventricular premature depolarization: Secondary | ICD-10-CM | POA: Diagnosis not present

## 2023-01-09 LAB — NM MYOCAR MULTI W/SPECT W/WALL MOTION / EF
Duke Treadmill Score: 8
Estimated workload: 7
Exercise duration (sec): 1 s
LV dias vol: 32 mL (ref 46–106)
MPHR: 152 {beats}/min
Nuc Stress EF: 66 %
Percent HR: 91 %
Rest HR: 65 {beats}/min
SDS: 0
SSS: 0
ST Depression (mm): 0 mm
TID: 0.5

## 2023-01-09 MED ORDER — TECHNETIUM TC 99M TETROFOSMIN IV KIT
9.9500 | PACK | Freq: Once | INTRAVENOUS | Status: AC | PRN
  Administered 2023-01-09: 9.95 via INTRAVENOUS

## 2023-01-09 MED ORDER — TECHNETIUM TC 99M SULFUR COLLOID
30.3200 | Freq: Once | INTRAVENOUS | Status: AC | PRN
  Administered 2023-01-09: 30.32 via INTRAVENOUS

## 2023-01-10 LAB — NM MYOCAR MULTI W/SPECT W/WALL MOTION / EF
Angina Index: 0
Base ST Depression (mm): 0 mm
Exercise duration (min): 8 min
LV sys vol: 11 mL
Peak HR: 139 {beats}/min
Rest Nuclear Isotope Dose: 10 mCi
SRS: 0
Stress Nuclear Isotope Dose: 30.3 mCi

## 2023-01-12 ENCOUNTER — Other Ambulatory Visit: Payer: Self-pay | Admitting: Internal Medicine

## 2023-02-23 ENCOUNTER — Telehealth: Payer: Medicare PPO | Admitting: Physician Assistant

## 2023-02-23 ENCOUNTER — Telehealth: Payer: Medicare PPO | Admitting: Nurse Practitioner

## 2023-02-23 DIAGNOSIS — U071 COVID-19: Secondary | ICD-10-CM | POA: Diagnosis not present

## 2023-02-23 MED ORDER — NIRMATRELVIR/RITONAVIR (PAXLOVID) TABLET (RENAL DOSING)
2.0000 | ORAL_TABLET | Freq: Two times a day (BID) | ORAL | 0 refills | Status: AC
Start: 2023-02-23 — End: 2023-02-28

## 2023-02-23 MED ORDER — BENZONATATE 100 MG PO CAPS
100.0000 mg | ORAL_CAPSULE | Freq: Three times a day (TID) | ORAL | 0 refills | Status: DC | PRN
Start: 2023-02-23 — End: 2023-03-23

## 2023-02-23 NOTE — Patient Instructions (Signed)
Sarah Delgado, thank you for joining Piedad Climes, PA-C for today's virtual visit.  While this provider is not your primary care provider (PCP), if your PCP is located in our provider database this encounter information will be shared with them immediately following your visit.   A [facility] MyChart account gives you access to today's visit and all your visits, tests, and labs performed at [facility] " click here if you don't have a [facility] MyChart account or go to [email or web address]  Consent: (Patient) Sarah Delgado provided verbal consent for this virtual visit at the beginning of the encounter.  Current Medications:  Current Outpatient Medications:    aspirin EC 81 MG tablet, Take 81 mg by mouth daily., Disp: , Rfl:    atorvastatin (LIPITOR) 10 MG tablet, TAKE 1 TABLET BY MOUTH EVERY DAY, Disp: 90 tablet, Rfl: 3   Biotin 1000 MCG tablet, Take 1,000 mcg by mouth daily. , Disp: , Rfl:    Cholecalciferol (VITAMIN D-3) 1000 units CAPS, Take 1 capsule by mouth daily., Disp: , Rfl:    Coenzyme Q10 10 MG capsule, Take 10 mg by mouth daily., Disp: , Rfl:    estradiol (ESTRACE) 0.5 MG tablet, Take 1 tablet (0.5 mg total) by mouth daily., Disp: 90 tablet, Rfl: 3   Semaglutide-Weight Management (WEGOVY) 1.7 MG/0.75ML SOAJ, INJECT 1.7 MG INTO THE SKIN ONCE A WEEK FOR 28 DAYS., Disp: 3 mL, Rfl: 1   triamterene-hydrochlorothiazide (MAXZIDE-25) 37.5-25 MG tablet, Take 0.5 tablets by mouth daily., Disp: 45 tablet, Rfl: 0   vitamin B-12 (CYANOCOBALAMIN) 500 MCG tablet, Take 500 mcg daily by mouth., Disp: , Rfl:    vitamin E 180 MG (400 UNITS) capsule, Take 400 Units daily by mouth., Disp: , Rfl:    Medications ordered in this encounter:  No orders of the defined types were placed in this encounter.    *If you need refills on other medications prior to your next appointment, please contact your pharmacy*  Follow-Up: Call back or seek an in-person evaluation if  the symptoms worsen or if the condition fails to improve as anticipated.  [facility] [phone]  Care Instructions: Please keep well-hydrated and get plenty of rest. Start a saline nasal rinse to flush out your nasal passages. You can use plain Mucinex to help thin congestion. Tessalon as directed for cough. If you have a humidifier, running in the bedroom at night. I want you to start OTC vitamin D3 1000 units daily, vitamin C 1000 mg daily, and a zinc supplement. Please take prescribed medications as directed.   Isolation Instructions: You are to isolate at home until you have been fever free for at least 24 hours without a fever-reducing medication, and symptoms have been steadily improving for 24 hours. At that time,  you can end isolation but need to mask for an additional 5 days.   If you must be around other household members who do not have symptoms, you need to make sure that both you and the family members are masking consistently with a high-quality mask.  If you note any worsening of symptoms despite treatment, please seek an in-person evaluation ASAP. If you note any significant shortness of breath or any chest pain, please seek ER evaluation. Please do not delay care!   COVID-19: What to Do if You Are Sick If you test positive and are an older adult or someone who is at high risk of getting very sick from COVID-19, treatment may  be available. Contact a healthcare provider right away after a positive test to determine if you are eligible, even if your symptoms are mild right now. You can also visit a Test to Treat location and, if eligible, receive a prescription from a provider. Don't delay: Treatment must be started within the first few days to be effective. If you have a fever, cough, or other symptoms, you might have COVID-19. Most people have mild illness and are able to recover at home. If you are sick: Keep track of your symptoms. If you have an  emergency warning sign (including trouble breathing), call 911. Steps to help prevent the spread of COVID-19 if you are sick If you are sick with COVID-19 or think you might have COVID-19, follow the steps below to care for yourself and to help protect other people in your home and community. Stay home except to get medical care Stay home. Most people with COVID-19 have mild illness and can recover at home without medical care. Do not leave your home, except to get medical care. Do not visit public areas and do not go to places where you are unable to wear a mask. Take care of yourself. Get rest and stay hydrated. Take over-the-counter medicines, such as acetaminophen, to help you feel better. Stay in touch with your doctor. Call before you get medical care. Be sure to get care if you have trouble breathing, or have any other emergency warning signs, or if you think it is an emergency. Avoid public transportation, ride-sharing, or taxis if possible. Get tested If you have symptoms of COVID-19, get tested. While waiting for test results, stay away from others, including staying apart from those living in your household. Get tested as soon as possible after your symptoms start. Treatments may be available for people with COVID-19 who are at risk for becoming very sick. Don't delay: Treatment must be started early to be effective--some treatments must begin within 5 days of your first symptoms. Contact your healthcare provider right away if your test result is positive to determine if you are eligible. Self-tests are one of several options for testing for the virus that causes COVID-19 and may be more convenient than laboratory-based tests and point-of-care tests. Ask your healthcare provider or your local health department if you need help interpreting your test results. You can visit your state, tribal, local, and territorial health department's website to look for the latest local information on testing  sites. Separate yourself from other people As much as possible, stay in a specific room and away from other people and pets in your home. If possible, you should use a separate bathroom. If you need to be around other people or animals in or outside of the home, wear a well-fitting mask. Tell your close contacts that they may have been exposed to COVID-19. An infected person can spread COVID-19 starting 48 hours (or 2 days) before the person has any symptoms or tests positive. By letting your close contacts know they may have been exposed to COVID-19, you are helping to protect everyone. See COVID-19 and Animals if you have questions about pets. If you are diagnosed with COVID-19, someone from the health department may call you. Answer the call to slow the spread. Monitor your symptoms Symptoms of COVID-19 include fever, cough, or other symptoms. Follow care instructions from your healthcare provider and local health department. Your local health authorities may give instructions on checking your symptoms and reporting information. When to seek emergency medical  attention Look for emergency warning signs* for COVID-19. If someone is showing any of these signs, seek emergency medical care immediately: Trouble breathing Persistent pain or pressure in the chest New confusion Inability to wake or stay awake Pale, gray, or blue-colored skin, lips, or nail beds, depending on skin tone *This list is not all possible symptoms. Please call your medical provider for any other symptoms that are severe or concerning to you. Call 911 or call ahead to your local emergency facility: Notify the operator that you are seeking care for someone who has or may have COVID-19. Call ahead before visiting your doctor Call ahead. Many medical visits for routine care are being postponed or done by phone or telemedicine. If you have a medical appointment that cannot be postponed, call your doctor's office, and tell them you  have or may have COVID-19. This will help the office protect themselves and other patients. If you are sick, wear a well-fitting mask You should wear a mask if you must be around other people or animals, including pets (even at home). Wear a mask with the best fit, protection, and comfort for you. You don't need to wear the mask if you are alone. If you can't put on a mask (because of trouble breathing, for example), cover your coughs and sneezes in some other way. Try to stay at least 6 feet away from other people. This will help protect the people around you. Masks should not be placed on young children under age 69 years, anyone who has trouble breathing, or anyone who is not able to remove the mask without help. Cover your coughs and sneezes Cover your mouth and nose with a tissue when you cough or sneeze. Throw away used tissues in a lined trash can. Immediately wash your hands with soap and water for at least 20 seconds. If soap and water are not available, clean your hands with an alcohol-based hand sanitizer that contains at least 60% alcohol. Clean your hands often Wash your hands often with soap and water for at least 20 seconds. This is especially important after blowing your nose, coughing, or sneezing; going to the bathroom; and before eating or preparing food. Use hand sanitizer if soap and water are not available. Use an alcohol-based hand sanitizer with at least 60% alcohol, covering all surfaces of your hands and rubbing them together until they feel dry. Soap and water are the best option, especially if hands are visibly dirty. Avoid touching your eyes, nose, and mouth with unwashed hands. Handwashing Tips Avoid sharing personal household items Do not share dishes, drinking glasses, cups, eating utensils, towels, or bedding with other people in your home. Wash these items thoroughly after using them with soap and water or put in the dishwasher. Clean surfaces in your home  regularly Clean and disinfect high-touch surfaces (for example, doorknobs, tables, handles, light switches, and countertops) in your "sick room" and bathroom. In shared spaces, you should clean and disinfect surfaces and items after each use by the person who is ill. If you are sick and cannot clean, a caregiver or other person should only clean and disinfect the area around you (such as your bedroom and bathroom) on an as needed basis. Your caregiver/other person should wait as long as possible (at least several hours) and wear a mask before entering, cleaning, and disinfecting shared spaces that you use. Clean and disinfect areas that may have blood, stool, or body fluids on them. Use household cleaners and disinfectants. Clean  visible dirty surfaces with household cleaners containing soap or detergent. Then, use a household disinfectant. Use a product from [employer] List N: Disinfectants for Coronavirus (COVID-19). Be sure to follow the instructions on the label to ensure safe and effective use of the product. Many products recommend keeping the surface wet with a disinfectant for a certain period of time (look at "contact time" on the product label). You may also need to wear personal protective equipment, such as gloves, depending on the directions on the product label. Immediately after disinfecting, wash your hands with soap and water for 20 seconds. For completed guidance on cleaning and disinfecting your home, visit Complete Disinfection Guidance. Take steps to improve ventilation at home Improve ventilation (air flow) at home to help prevent from spreading COVID-19 to other people in your household. Clear out COVID-19 virus particles in the air by opening windows, using air filters, and turning on fans in your home. Use this interactive tool to learn how to improve air flow in your home. When you can be around others after being sick with COVID-19 Deciding when you can be around others is  different for different situations. Find out when you can safely end home isolation. For any additional questions about your care, contact your healthcare provider or state or local health department. 10/23/2020 Content source: [facility] for Immunization and Respiratory Diseases ([facility]), Division of Viral Diseases This information is not intended to replace advice given to you by your health care provider. Make sure you discuss any questions you have with your health care provider. Document Revised: 12/06/2020 Document Reviewed: 12/06/2020 Elsevier Patient Education  2022 [employer].     If you have been instructed to have an in-person evaluation today at a local Urgent Care facility, please use the link below. It will take you to a list of all of our available [facility], including address, phone number and hours of operation. Please do not delay care.  [facility]  If you or a family member do not have a primary care provider, use the link below to schedule a visit and establish care. When you choose a [facility] primary care physician or advanced practice provider, you gain a long-term partner in health. Find a Primary Care Provider  Learn more about [facility]'s in-office and virtual care options: [place] [facility] - Get Care Now

## 2023-02-23 NOTE — Progress Notes (Signed)
Since your COVID-19 symptoms started less than 5 days ago you may need a prescription for FDA-approved treatments (pills or IV therapy), which we cannot prescribe through an e-Visit. The best way for our providers to make a decision about which COVID treatment is right for you is through a Virtual Urgent Care Visit.  If you would like to discuss COVID therapy options with a provider, cancel this e-Visit and access a Virtual Urgent Care Visit from our [facility] menu.   You will not be charged for the e-Visit.

## 2023-02-23 NOTE — Progress Notes (Signed)
Virtual Visit Consent   Sarah Delgado, you are scheduled for a virtual visit with a [facility] provider today. Just as with appointments in the office, your consent must be obtained to participate. Your consent will be active for this visit and any virtual visit you may have with one of our providers in the next 365 days. If you have a MyChart account, a copy of this consent can be sent to you electronically.  As this is a virtual visit, video technology does not allow for your provider to perform a traditional examination. This may limit your provider's ability to fully assess your condition. If your provider identifies any concerns that need to be evaluated in person or the need to arrange testing (such as labs, EKG, etc.), we will make arrangements to do so. Although advances in technology are sophisticated, we cannot ensure that it will always work on either your end or our end. If the connection with a video visit is poor, the visit may have to be switched to a telephone visit. With either a video or telephone visit, we are not always able to ensure that we have a secure connection.  By engaging in this virtual visit, you consent to the provision of healthcare and authorize for your insurance to be billed (if applicable) for the services provided during this visit. Depending on your insurance coverage, you may receive a charge related to this service.  I need to obtain your verbal consent now. Are you willing to proceed with your visit today? Sarah Delgado has provided verbal consent on 02/23/2023 for a virtual visit (video or telephone). Sarah Delgado, [place]  Date: 02/23/2023 9:46 AM  Virtual Visit via Video Note   I, Sarah Delgado, connected with  Sarah Delgado  ([id number], October 24, 1955) on 02/23/23 at  9:45 AM EDT by a video-enabled telemedicine application and verified that I am speaking with the correct person using two identifiers.  Location: Patient: Virtual Visit  Location Patient: Home Provider: Virtual Visit Location Provider: Home Office   I discussed the limitations of evaluation and management by telemedicine and the availability of in person appointments. The patient expressed understanding and agreed to proceed.    History of Present Illness: Sarah Delgado is a 67 y.o. who identifies as a female who was assigned female at birth, and is being seen today for COVID-19. Notes symptoms starting 7/19 after return from cruise in [place]. Notes nasal and head congestion, cough, body aches, fever (101.4). Denies chest pain or SOB. Denies GI symptoms. Has had COVID before, requiring antiviral medications. Denies prior hospitalizations due to COVID. 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   Encounter for preventive health examination 03/19/2014   Menopausal syndrome on hormone replacement therapy 12/28/2013   S/P total hysterectomy and bilateral salpingo-oophorectomy 06/29/2013   Overweight (BMI 25.0-29.9) 09/16/2012   Essential hypertension, benign 09/16/2012   Hyperlipidemia LDL goal <100 09/16/2012    Allergies: No Known Allergies Medications:  Current Outpatient Medications:  benzonatate (TESSALON) 100 MG capsule, Take 1 capsule (100 mg total) by mouth 3 (three) times daily as needed for cough., Disp: 30 capsule, Rfl: 0   nirmatrelvir/ritonavir, renal dosing, (PAXLOVID) 10 x 150 MG & 10 x 100MG  TABS, Take 2 tablets by mouth 2 (two) times daily for 5  days. (Take nirmatrelvir 150 mg one tablet twice daily for 5 days and ritonavir 100 mg one tablet twice daily for 5 days) Patient GFR is 58, Disp: 20 tablet, Rfl: 0   aspirin EC 81 MG tablet, Take 81 mg by mouth daily., Disp: , Rfl:    atorvastatin (LIPITOR) 10 MG tablet, TAKE 1 TABLET BY MOUTH EVERY DAY, Disp: 90 tablet, Rfl: 3   Biotin 1000 MCG tablet, Take 1,000 mcg by mouth daily. , Disp: , Rfl:    Cholecalciferol (VITAMIN D-3) 1000 units CAPS, Take 1 capsule by mouth daily., Disp: , Rfl:    Coenzyme Q10 10 MG capsule, Take 10 mg by mouth daily., Disp: , Rfl:    estradiol (ESTRACE) 0.5 MG tablet, Take 1 tablet (0.5 mg total) by mouth daily., Disp: 90 tablet, Rfl: 3   Semaglutide-Weight Management (WEGOVY) 1.7 MG/0.75ML SOAJ, INJECT 1.7 MG INTO THE SKIN ONCE A WEEK FOR 28 DAYS., Disp: 3 mL, Rfl: 1   triamterene-hydrochlorothiazide (MAXZIDE-25) 37.5-25 MG tablet, Take 0.5 tablets by mouth daily., Disp: 45 tablet, Rfl: 0   vitamin B-12 (CYANOCOBALAMIN) 500 MCG tablet, Take 500 mcg daily by mouth., Disp: , Rfl:    vitamin E 180 MG (400 UNITS) capsule, Take 400 Units daily by mouth., Disp: , Rfl:   Observations/Objective: Patient is well-developed, well-nourished in no acute distress.  Resting comfortably  at home.  Head is normocephalic, atraumatic.  No labored breathing.  Speech is clear and coherent with logical content.  Patient is alert and oriented at baseline.   Assessment and Plan: 1. COVID-19 - benzonatate (TESSALON) 100 MG capsule; Take 1 capsule (100 mg total) by mouth 3 (three) times daily as needed for cough.  Dispense: 30 capsule; Refill: 0 - nirmatrelvir/ritonavir, renal dosing, (PAXLOVID) 10 x 150 MG & 10 x 100MG  TABS; Take 2 tablets by mouth 2 (two) times daily for 5 days. (Take nirmatrelvir 150 mg one tablet twice daily for 5 days and ritonavir 100 mg one tablet twice daily for 5 days) Patient GFR is 58  Dispense: 20 tablet; Refill: 0  Patient with multiple risk factors for  complicated course of illness. Discussed risks/benefits of antiviral medications including most common potential ADRs. Patient voiced understanding and would like to proceed with antiviral medication. They are candidate for Paxlovid -- renal dose sent due to last GFR at 58. Rx sent to pharmacy. Supportive measures, OTC medications and vitamin regimen reviewed. Tessalon per orders. Quarantine reviewed in detail. Strict ER precautions discussed with patient.    Follow Up Instructions: I discussed the assessment and treatment plan with the patient. The patient was provided an opportunity to ask questions and all were answered. The patient agreed with the plan and demonstrated an understanding of the instructions.  A copy of instructions were sent to the patient via MyChart unless otherwise noted below.   The patient was advised to call back or seek an in-person evaluation if the symptoms worsen or if the condition fails to improve as anticipated.  Time:  I spent 10 minutes with the patient via telehealth technology discussing the above problems/concerns.    Sarah Climes, PA-C

## 2023-02-24 ENCOUNTER — Other Ambulatory Visit: Payer: Self-pay | Admitting: Internal Medicine

## 2023-02-24 DIAGNOSIS — I10 Essential (primary) hypertension: Secondary | ICD-10-CM

## 2023-03-04 ENCOUNTER — Encounter: Payer: Self-pay | Admitting: Cardiology

## 2023-03-04 ENCOUNTER — Encounter (INDEPENDENT_AMBULATORY_CARE_PROVIDER_SITE_OTHER): Payer: Self-pay

## 2023-03-04 ENCOUNTER — Ambulatory Visit: Payer: Medicare PPO | Attending: Cardiology | Admitting: Cardiology

## 2023-03-04 ENCOUNTER — Telehealth: Payer: Self-pay | Admitting: Internal Medicine

## 2023-03-04 VITALS — BP 118/80 | HR 68 | Ht 67.0 in | Wt 158.2 lb

## 2023-03-04 DIAGNOSIS — E7800 Pure hypercholesterolemia, unspecified: Secondary | ICD-10-CM

## 2023-03-04 DIAGNOSIS — I493 Ventricular premature depolarization: Secondary | ICD-10-CM | POA: Diagnosis not present

## 2023-03-04 NOTE — Progress Notes (Signed)
Cardiology Office Note:    Date:  03/04/2023   ID:  Sarah Delgado, DOB 1956-02-13, MRN [id number]  PCP:  Sherlene Shams, MD   [facility] Providers Cardiologist:  Debbe Odea, MD     Referring MD: Sherlene Shams, MD   Chief Complaint  Patient presents with   Follow-up    Follow up stress test. No new c/o    History of Present Illness:    Sarah Delgado is a 67 y.o. female with a hx of hyperlipidemia, frequent PVCs, former smoker who presents for follow-up.    Previously seen due to palpitations.  Previous monitor showed frequent PVCs.  Echocardiogram was obtained to evaluate any structural abnormalities.  Patient was advised on cutting back on caffeine intake.  She also cut back on smoking, overall her symptoms are much improved.  Denies any new complaints at this time.  Prior notes/studies Cardiac monitor 10/2022 showed frequent PVCs 10.4% burden, no sustained arrhythmias. Echo 09/2018 EF 60 to 65%  Past Medical History:  Diagnosis Date   Chicken pox    Hyperlipidemia    Hypertension    Tobacco abuse 09/16/2012   Social smoker on weekends.      Past Surgical History:  Procedure Laterality Date   ABDOMINAL HYSTERECTOMY     BREAST BIOPSY Left 12/14/2020   cylinder marker, BENIGN BREAST TISSUE WITH: FIBROCYSTIC CHANGES WITH ASSOCIATED CALCIFICATIONS, USUAL DUCTAL HYPERPLASIA , DENSE STROMAL FIBROSIS, NEGATIVE FOR AYPTIA OR MALIGNANCY   BREAST SURGERY  2000   reduction   COLONOSCOPY WITH PROPOFOL N/A 12/27/2019   Procedure: COLONOSCOPY WITH PROPOFOL;  Surgeon: Wyline Mood, MD;  Location[facility] ENDOSCOPY;  Service: Gastroenterology;  Laterality: N/A;   REDUCTION MAMMAPLASTY Bilateral    at least 15 years ago   TOTAL ABDOMINAL HYSTERECTOMY W/ BILATERAL SALPINGOOPHORECTOMY  2005    Current Medications: Current Meds  Medication Sig   aspirin EC 81 MG tablet Take 81 mg by mouth daily.   atorvastatin (LIPITOR) 10 MG tablet TAKE 1 TABLET BY MOUTH EVERY  DAY   Biotin 1000 MCG tablet Take 1,000 mcg by mouth daily.    Cholecalciferol (VITAMIN D-3) 1000 units CAPS Take 1 capsule by mouth daily.   Coenzyme Q10 10 MG capsule Take 10 mg by mouth daily.   estradiol (ESTRACE) 0.5 MG tablet Take 1 tablet (0.5 mg total) by mouth daily.   Semaglutide-Weight Management (WEGOVY) 1.7 MG/0.75ML SOAJ INJECT 1.7 MG INTO THE SKIN ONCE A WEEK FOR 28 DAYS.   triamterene-hydrochlorothiazide (MAXZIDE-25) 37.5-25 MG tablet Take 0.5 tablets by mouth daily.   vitamin B-12 (CYANOCOBALAMIN) 500 MCG tablet Take 500 mcg daily by mouth.   vitamin E 180 MG (400 UNITS) capsule Take 400 Units daily by mouth.     Allergies:   Patient has no known allergies.   Social History   Socioeconomic History   Marital status: Divorced    Spouse name: Not on file   Number of children: Not on file   Years of education: 16 plus   Highest education level: Not on file  Occupational History   Occupation: educator    Employer: [employer]  Tobacco Use   Smoking status: Former    Types: Cigarettes    Start date: 11/18/2016   Smokeless tobacco: Never  Vaping Use   Vaping status: Never Used  Substance and Sexual Activity   Alcohol use: Yes    Comment: OCC wine   Drug use: No   Sexual activity: Yes  Partners: Male  Other Topics Concern   Not on file  Social History Narrative   Programmer, systems, works in [employer] school system   Social Determinants of Health   Financial Resource Strain: Low Risk  (03/17/2022)   Overall Financial Resource Strain (CARDIA)    Difficulty of Paying Living Expenses: Not hard at all  Food Insecurity: No Food Insecurity (03/17/2022)   Hunger Vital Sign    Worried About Running Out of Food in the Last Year: Never true    Ran Out of Food in the Last Year: Never true  Transportation Needs: No Transportation Needs (03/17/2022)   PRAPARE - Administrator, Civil Service (Medical): No    Lack of Transportation (Non-Medical): No   Physical Activity: Not on file  Stress: No Stress Concern Present (03/17/2022)   [employer] of Occupational Health - Occupational Stress Questionnaire    Feeling of Stress : Not at all  Social Connections: Not on file     Family History: The patient's family history includes Arthritis in her mother; COPD in her sister; Cancer in her brother and maternal aunt; Diabetes in her brother, brother, and sister; Emphysema in her father; Heart disease in her maternal grandfather; Hypertension in her brother, brother, brother, brother, and mother; Kidney disease in her sister; Mental illness in her mother; Stroke in her brother. There is no history of Breast cancer.  ROS:   Please see the history of present illness.     All other systems reviewed and are negative.  EKGs/Labs/Other Studies Reviewed:    The following studies were reviewed today:   EKG:  EKG not ordered today.   Recent Labs: 09/05/2022: ALT 11; BUN 18; Creat 1.05; Hemoglobin 15.2; Magnesium 2.0; Platelets 362; Potassium 4.0; Sodium 135 10/08/2022: TSH 1.21  Recent Lipid Panel    Component Value Date/Time   CHOL 214 (H) 12/02/2021 0850   CHOL 225 (H) 11/10/2017 0830   TRIG 127.0 12/02/2021 0850   HDL 72.80 12/02/2021 0850   HDL 62 11/10/2017 0830   CHOLHDL 3 12/02/2021 0850   VLDL 25.4 12/02/2021 0850   LDLCALC 116 (H) 12/02/2021 0850   LDLCALC 144 (H) 11/10/2017 0830   LDLDIRECT 122.0 12/02/2021 0850     Risk Assessment/Calculations:             Physical Exam:    VS:  BP 118/80 (BP Location: Left Arm, Patient Position: Sitting)   Pulse 68   Ht 5\' 7"  (1.702 m)   Wt 158 lb 3.2 oz (71.8 kg)   SpO2 99%   BMI 24.78 kg/m     Wt Readings from Last 3 Encounters:  03/04/23 158 lb 3.2 oz (71.8 kg)  01/01/23 160 lb 4 oz (72.7 kg)  12/01/22 162 lb 9.6 oz (73.8 kg)     GEN:  Well nourished, well developed in no acute distress HEENT: Normal NECK: No JVD; No carotid bruits CARDIAC: RRR, no murmurs, rubs,  gallops RESPIRATORY:  Clear to auscultation without rales, wheezing or rhonchi  ABDOMEN: Soft, non-tender, non-distended MUSCULOSKELETAL:  No edema; No deformity  SKIN: Warm and dry NEUROLOGIC:  Alert and oriented x 3 PSYCHIATRIC:  Normal affect   ASSESSMENT:    1. Frequent PVCs   2. Pure hypercholesterolemia    PLAN:    In order of problems listed above:  Frequent PVCs, 10.4% burden.  Echo 5/24 EF 60 to 65%, Lexiscan Myoview with no significant ischemia.  Symptoms of palpitations overall improved.  Smoking might have contributed.  Congratulated on stopping smoking. Hyperlipidemia, Lipitor 10 mg daily  Follow-up after echo      Medication Adjustments/Labs and Tests Ordered: Current medicines are reviewed at length with the patient today.  Concerns regarding medicines are outlined above.  Orders Placed This Encounter  Procedures   Lipid panel   No orders of the defined types were placed in this encounter.   Patient Instructions  Medication Instructions:   Your physician recommends that you continue on your current medications as directed. Please refer to the Current Medication list given to you today.  *If you need a refill on your cardiac medications before your next appointment, please call your pharmacy*   Lab Work:  Your provider would like for you to return in 3 months to have the following labs drawn: Lipid.   Please go to [facility] [address] ([facility] [address], [place] [address] You do not need an appointment.  They are open from 7:30 am-4 pm.  Lunch from 1:00 pm- 2:00 pm You WILL need to be fasting.      If you have labs (blood work) drawn today and your tests are completely normal, you will receive your results only by: MyChart Message (if you have MyChart) OR A paper copy in the mail If you have any lab test that is abnormal or we need to change your treatment, we will call you to review the  results.   Testing/Procedures:  None Ordered   Follow-Up: At [facility], you and your health needs are our priority.  As part of our continuing mission to provide you with exceptional heart care, we have created designated Provider Care Teams.  These Care Teams include your primary Cardiologist (physician) and Advanced Practice Providers (APPs -  Physician Assistants and Nurse Practitioners) who all work together to provide you with the care you need, when you need it.  We recommend signing up for the patient portal called "MyChart".  Sign up information is provided on this After Visit Summary.  MyChart is used to connect with patients for Virtual Visits (Telemedicine).  Patients are able to view lab/test results, encounter notes, upcoming appointments, etc.  Non-urgent messages can be sent to your provider as well.   To learn more about what you can do with MyChart, go to [email or web address].    Your next appointment:   6 month(s)  Provider:   You may see Debbe Odea, MD or one of the following Advanced Practice Providers on your designated Care Team:   Nicolasa Ducking, NP Eula Listen, PA-C Cadence Fransico Michael, PA-C Charlsie Quest, NP   Signed, Debbe Odea, MD  03/04/2023 12:42 PM    [facility]

## 2023-03-04 NOTE — Patient Instructions (Signed)
Medication Instructions:   Your physician recommends that you continue on your current medications as directed. Please refer to the Current Medication list given to you today.  *If you need a refill on your cardiac medications before your next appointment, please call your pharmacy*   Lab Work:  Your provider would like for you to return in 3 months to have the following labs drawn: Lipid.   Please go to [facility] [address] ([facility] [address], [place] [address] You do not need an appointment.  They are open from 7:30 am-4 pm.  Lunch from 1:00 pm- 2:00 pm You WILL need to be fasting.      If you have labs (blood work) drawn today and your tests are completely normal, you will receive your results only by: MyChart Message (if you have MyChart) OR A paper copy in the mail If you have any lab test that is abnormal or we need to change your treatment, we will call you to review the results.   Testing/Procedures:  None Ordered   Follow-Up: At [facility], you and your health needs are our priority.  As part of our continuing mission to provide you with exceptional heart care, we have created designated Provider Care Teams.  These Care Teams include your primary Cardiologist (physician) and Advanced Practice Providers (APPs -  Physician Assistants and Nurse Practitioners) who all work together to provide you with the care you need, when you need it.  We recommend signing up for the patient portal called "MyChart".  Sign up information is provided on this After Visit Summary.  MyChart is used to connect with patients for Virtual Visits (Telemedicine).  Patients are able to view lab/test results, encounter notes, upcoming appointments, etc.  Non-urgent messages can be sent to your provider as well.   To learn more about what you can do with MyChart, go to [email or web address].    Your next appointment:   6 month(s)  Provider:   You may see  Debbe Odea, MD or one of the following Advanced Practice Providers on your designated Care Team:   Nicolasa Ducking, NP Eula Listen, PA-C Cadence Fransico Michael, PA-C Charlsie Quest, NP

## 2023-03-04 NOTE — Telephone Encounter (Signed)
Patient dropped off document  health form , to be filled out by provider. Patient requested to send it via Call Patient to pick up within 5-days. Document is located in providers folder at front office.Please advise at Mobile [phone] (mobile).

## 2023-03-05 ENCOUNTER — Encounter: Payer: Self-pay | Admitting: Internal Medicine

## 2023-03-06 NOTE — Telephone Encounter (Signed)
Noted, pt was informed and responded

## 2023-03-11 ENCOUNTER — Telehealth: Payer: Self-pay | Admitting: Internal Medicine

## 2023-03-19 ENCOUNTER — Encounter (INDEPENDENT_AMBULATORY_CARE_PROVIDER_SITE_OTHER): Payer: Self-pay

## 2023-03-23 ENCOUNTER — Ambulatory Visit (INDEPENDENT_AMBULATORY_CARE_PROVIDER_SITE_OTHER): Payer: Medicare PPO | Admitting: *Deleted

## 2023-03-23 ENCOUNTER — Ambulatory Visit (INDEPENDENT_AMBULATORY_CARE_PROVIDER_SITE_OTHER): Payer: Medicare PPO

## 2023-03-23 ENCOUNTER — Telehealth: Payer: Self-pay

## 2023-03-23 VITALS — BP 110/68 | HR 64 | Temp 97.2°F | Ht 66.0 in | Wt 159.2 lb

## 2023-03-23 DIAGNOSIS — Z780 Asymptomatic menopausal state: Secondary | ICD-10-CM | POA: Diagnosis not present

## 2023-03-23 DIAGNOSIS — Z111 Encounter for screening for respiratory tuberculosis: Secondary | ICD-10-CM | POA: Diagnosis not present

## 2023-03-23 DIAGNOSIS — Z0000 Encounter for general adult medical examination without abnormal findings: Secondary | ICD-10-CM

## 2023-03-23 NOTE — Patient Instructions (Signed)
[name] , Thank you for taking time to come for your Medicare Wellness Visit. I appreciate your ongoing commitment to your health goals. Please review the following plan we discussed and let me know if I can assist you in the future.   Referrals/Orders/Follow-Ups/Clinician Recommendations: Dexa Scan   This is a list of the screening recommended for you and due dates:  Health Maintenance  Topic Date Due   DEXA scan (bone density measurement)  Never done   COVID-19 Vaccine (5 - 2023-24 season) 04/04/2022   Colon Cancer Screening  12/27/2022   Flu Shot  03/05/2023   Mammogram  01/07/2024   Medicare Annual Wellness Visit  03/22/2024   DTaP/Tdap/Td vaccine (3 - Td or Tdap) 01/01/2032   Pneumonia Vaccine  Completed   Hepatitis C Screening  Completed   Zoster (Shingles) Vaccine  Completed   HPV Vaccine  Aged Out    Advanced directives: (Provided) Advance directive discussed with you today. I have provided a copy for you to complete at home and have notarized. Once this is complete, please bring a copy in to our office so we can scan it into your chart.   Next Medicare Annual Wellness Visit scheduled for next year: Yes 03/23/24 @ 9:15  Preventive Care 65 Years and Older, Female Preventive care refers to lifestyle choices and visits with your health care provider that can promote health and wellness. What does preventive care include? A yearly physical exam. This is also called an annual well check. Dental exams once or twice a year. Routine eye exams. Ask your health care provider how often you should have your eyes checked. Personal lifestyle choices, including: Daily care of your teeth and gums. Regular physical activity. Eating a healthy diet. Avoiding tobacco and drug use. Limiting alcohol use. Practicing safe sex. Taking low-dose aspirin every day. Taking vitamin and mineral supplements as recommended by your health care provider. What happens during an annual well check? The  services and screenings done by your health care provider during your annual well check will depend on your age, overall health, lifestyle risk factors, and family history of disease. Counseling  Your health care provider may ask you questions about your: Alcohol use. Tobacco use. Drug use. Emotional well-being. Home and relationship well-being. Sexual activity. Eating habits. History of falls. Memory and ability to understand (cognition). Work and work Astronomer. Reproductive health. Screening  You may have the following tests or measurements: Height, weight, and BMI. Blood pressure. Lipid and cholesterol levels. These may be checked every 5 years, or more frequently if you are over 68 years old. Skin check. Lung cancer screening. You may have this screening every year starting at age 68 if you have a 30-pack-year history of smoking and currently smoke or have quit within the past 15 years. Fecal occult blood test (FOBT) of the stool. You may have this test every year starting at age 68. Flexible sigmoidoscopy or colonoscopy. You may have a sigmoidoscopy every 5 years or a colonoscopy every 10 years starting at age 68. Hepatitis C blood test. Hepatitis B blood test. Sexually transmitted disease (STD) testing. Diabetes screening. This is done by checking your blood sugar (glucose) after you have not eaten for a while (fasting). You may have this done every 1-3 years. Bone density scan. This is done to screen for osteoporosis. You may have this done starting at age 68. Mammogram. This may be done every 1-2 years. Talk to your health care provider about how often you should  have regular mammograms. Talk with your health care provider about your test results, treatment options, and if necessary, the need for more tests. Vaccines  Your health care provider may recommend certain vaccines, such as: Influenza vaccine. This is recommended every year. Tetanus, diphtheria, and acellular  pertussis (Tdap, Td) vaccine. You may need a Td booster every 10 years. Zoster vaccine. You may need this after age 68. Pneumococcal 13-valent conjugate (PCV13) vaccine. One dose is recommended after age 68. Pneumococcal polysaccharide (PPSV23) vaccine. One dose is recommended after age 68. Talk to your health care provider about which screenings and vaccines you need and how often you need them. This information is not intended to replace advice given to you by your health care provider. Make sure you discuss any questions you have with your health care provider. Document Released: 08/17/2015 Document Revised: 04/09/2016 Document Reviewed: 05/22/2015 Elsevier Interactive Patient Education  2017 [employer].  Fall Prevention in the Home Falls can cause injuries. They can happen to people of all ages. There are many things you can do to make your home safe and to help prevent falls. What can I do on the outside of my home? Regularly fix the edges of walkways and driveways and fix any cracks. Remove anything that might make you trip as you walk through a door, such as a raised step or threshold. Trim any bushes or trees on the path to your home. Use bright outdoor lighting. Clear any walking paths of anything that might make someone trip, such as rocks or tools. Regularly check to see if handrails are loose or broken. Make sure that both sides of any steps have handrails. Any raised decks and porches should have guardrails on the edges. Have any leaves, snow, or ice cleared regularly. Use sand or salt on walking paths during winter. Clean up any spills in your garage right away. This includes oil or grease spills. What can I do in the bathroom? Use night lights. Install grab bars by the toilet and in the tub and shower. Do not use towel bars as grab bars. Use non-skid mats or decals in the tub or shower. If you need to sit down in the shower, use a plastic, non-slip stool. Keep the floor  dry. Clean up any water that spills on the floor as soon as it happens. Remove soap buildup in the tub or shower regularly. Attach bath mats securely with double-sided non-slip rug tape. Do not have throw rugs and other things on the floor that can make you trip. What can I do in the bedroom? Use night lights. Make sure that you have a light by your bed that is easy to reach. Do not use any sheets or blankets that are too big for your bed. They should not hang down onto the floor. Have a firm chair that has side arms. You can use this for support while you get dressed. Do not have throw rugs and other things on the floor that can make you trip. What can I do in the kitchen? Clean up any spills right away. Avoid walking on wet floors. Keep items that you use a lot in easy-to-reach places. If you need to reach something above you, use a strong step stool that has a grab bar. Keep electrical cords out of the way. Do not use floor polish or wax that makes floors slippery. If you must use wax, use non-skid floor wax. Do not have throw rugs and other things on the floor  that can make you trip. What can I do with my stairs? Do not leave any items on the stairs. Make sure that there are handrails on both sides of the stairs and use them. Fix handrails that are broken or loose. Make sure that handrails are as long as the stairways. Check any carpeting to make sure that it is firmly attached to the stairs. Fix any carpet that is loose or worn. Avoid having throw rugs at the top or bottom of the stairs. If you do have throw rugs, attach them to the floor with carpet tape. Make sure that you have a light switch at the top of the stairs and the bottom of the stairs. If you do not have them, ask someone to add them for you. What else can I do to help prevent falls? Wear shoes that: Do not have high heels. Have rubber bottoms. Are comfortable and fit you well. Are closed at the toe. Do not wear  sandals. If you use a stepladder: Make sure that it is fully opened. Do not climb a closed stepladder. Make sure that both sides of the stepladder are locked into place. Ask someone to hold it for you, if possible. Clearly mark and make sure that you can see: Any grab bars or handrails. First and last steps. Where the edge of each step is. Use tools that help you move around (mobility aids) if they are needed. These include: Canes. Walkers. Scooters. Crutches. Turn on the lights when you go into a dark area. Replace any light bulbs as soon as they burn out. Set up your furniture so you have a clear path. Avoid moving your furniture around. If any of your floors are uneven, fix them. If there are any pets around you, be aware of where they are. Review your medicines with your doctor. Some medicines can make you feel dizzy. This can increase your chance of falling. Ask your doctor what other things that you can do to help prevent falls. This information is not intended to replace advice given to you by your health care provider. Make sure you discuss any questions you have with your health care provider. Document Released: 05/17/2009 Document Revised: 12/27/2015 Document Reviewed: 08/25/2014 Elsevier Interactive Patient Education  2017 [employer].

## 2023-03-23 NOTE — Progress Notes (Cosign Needed Addendum)
Subjective:   Sarah Delgado is a 67 y.o. female who presents for Medicare Annual (Subsequent) preventive examination.  Visit Complete: In person  Patient Medicare AWV questionnaire was completed by the patient on 03/19/23; I have confirmed that all information answered by patient is correct and no changes since this date.  Review of Systems     Cardiac Risk Factors include: advanced age (>47men, >80 women);dyslipidemia;hypertension;Other (see comment), Risk factor comments: Palpitaions     Objective:    Today's Vitals   03/23/23 0950  BP: 110/68  Pulse: 64  Temp: (!) 97.2 F (36.2 C)  TempSrc: Skin  SpO2: 98%  Weight: 159 lb 4 oz (72.2 kg)  Height: 5\' 6"  (1.676 m)   Body mass index is 25.7 kg/m.     03/23/2023   10:07 AM 03/17/2022   11:35 AM 12/27/2019    8:32 AM 05/31/2015    7:36 AM  Advanced Directives  Does Patient Have a Medical Advance Directive? No No No No  Would patient like information on creating a medical advance directive? Yes (MAU/Ambulatory/Procedural Areas - Information given) Yes (MAU/Ambulatory/Procedural Areas - Information given)  Yes - Educational materials given    Current Medications (verified) Outpatient Encounter Medications as of 03/23/2023  Medication Sig   aspirin EC 81 MG tablet Take 81 mg by mouth daily.   atorvastatin (LIPITOR) 10 MG tablet TAKE 1 TABLET BY MOUTH EVERY DAY   Biotin 1000 MCG tablet Take 1,000 mcg by mouth daily.    Cholecalciferol (VITAMIN D-3) 1000 units CAPS Take 1 capsule by mouth daily.   Coenzyme Q10 10 MG capsule Take 10 mg by mouth daily.   estradiol (ESTRACE) 0.5 MG tablet Take 1 tablet (0.5 mg total) by mouth daily.   Semaglutide-Weight Management (WEGOVY) 1.7 MG/0.75ML SOAJ INJECT 1.7 MG INTO THE SKIN ONCE A WEEK FOR 28 DAYS.   triamterene-hydrochlorothiazide (MAXZIDE-25) 37.5-25 MG tablet Take 0.5 tablets by mouth daily.   vitamin B-12 (CYANOCOBALAMIN) 500 MCG tablet Take 500 mcg daily by mouth.   vitamin  E 180 MG (400 UNITS) capsule Take 400 Units daily by mouth.   [DISCONTINUED] benzonatate (TESSALON) 100 MG capsule Take 1 capsule (100 mg total) by mouth 3 (three) times daily as needed for cough. (Patient not taking: Reported on 03/04/2023)   No facility-administered encounter medications on file as of 03/23/2023.    Allergies (verified) Patient has no known allergies.   History: Past Medical History:  Diagnosis Date   Chicken pox    Hyperlipidemia    Hypertension    Tobacco abuse 09/16/2012   Social smoker on weekends.     Past Surgical History:  Procedure Laterality Date   ABDOMINAL HYSTERECTOMY     BREAST BIOPSY Left 12/14/2020   cylinder marker, BENIGN BREAST TISSUE WITH: FIBROCYSTIC CHANGES WITH ASSOCIATED CALCIFICATIONS, USUAL DUCTAL HYPERPLASIA , DENSE STROMAL FIBROSIS, NEGATIVE FOR AYPTIA OR MALIGNANCY   BREAST SURGERY  2000   reduction   COLONOSCOPY WITH PROPOFOL N/A 12/27/2019   Procedure: COLONOSCOPY WITH PROPOFOL;  Surgeon: Wyline Mood, MD;  Location: [facility];  Service: Gastroenterology;  Laterality: N/A;   REDUCTION MAMMAPLASTY Bilateral    at least 15 years ago   TOTAL ABDOMINAL HYSTERECTOMY W/ BILATERAL SALPINGOOPHORECTOMY  2005   Family History  Problem Relation Age of Onset   Arthritis Mother    Hypertension Mother    Mental illness Mother    Emphysema Father    COPD Sister    Diabetes Sister    Kidney disease  Sister    Diabetes Brother    Hypertension Brother    Heart disease Maternal Grandfather    Diabetes Brother    Hypertension Brother    Cancer Brother        prostate   Hypertension Brother    Stroke Brother    Hypertension Brother    Cancer Maternal Aunt        esophageal ca,  history of etoh and tobacco    Breast cancer Neg Hx    Social History   Socioeconomic History   Marital status: Divorced    Spouse name: Not on file   Number of children: Not on file   Years of education: 16 plus   Highest education level: Not on file   Occupational History   Occupation: Wellsite geologist: Film/video editor [employer]  Tobacco Use   Smoking status: Former    Types: Cigarettes    Start date: 11/18/2016   Smokeless tobacco: Never  Vaping Use   Vaping status: Never Used  Substance and Sexual Activity   Alcohol use: Yes    Comment: OCC wine   Drug use: No   Sexual activity: Yes    Partners: Male  Other Topics Concern   Not on file  Social History Narrative   Programmer, systems, works in [employer] school system   Social Determinants of Health   Financial Resource Strain: Low Risk  (03/19/2023)   Overall Financial Resource Strain (CARDIA)    Difficulty of Paying Living Expenses: Not hard at all  Food Insecurity: No Food Insecurity (03/19/2023)   Hunger Vital Sign    Worried About Running Out of Food in the Last Year: Never true    Ran Out of Food in the Last Year: Never true  Transportation Needs: No Transportation Needs (03/19/2023)   PRAPARE - Administrator, Civil Service (Medical): No    Lack of Transportation (Non-Medical): No  Physical Activity: Inactive (03/19/2023)   Exercise Vital Sign    Days of Exercise per Week: 0 days    Minutes of Exercise per Session: 0 min  Stress: No Stress Concern Present (03/19/2023)   [employer] of Occupational Health - Occupational Stress Questionnaire    Feeling of Stress : Only a little  Social Connections: Unknown (03/19/2023)   Social Connection and Isolation Panel [NHANES]    Frequency of Communication with Friends and Family: More than three times a week    Frequency of Social Gatherings with Friends and Family: Three times a week    Attends Religious Services: Not on file    Active Member of Clubs or Organizations: Yes    Attends Banker Meetings: More than 4 times per year    Marital Status: Divorced    Tobacco Counseling Counseling given: Not Answered   Clinical Intake:  Pre-visit preparation completed: Yes  Pain : No/denies  pain     BMI - recorded: 25.7 Nutritional Status: BMI 25 -29 Overweight Nutritional Risks: None Diabetes: No  How often do you need to have someone help you when you read instructions, pamphlets, or other written materials from your doctor or pharmacy?: 1 - Never  Interpreter Needed?: No  Information entered by :: R. Janeane Cozart LPN   Activities of Daily Living    03/23/2023    9:57 AM 03/19/2023    8:19 PM  In your present state of health, do you have any difficulty performing the following activities:  Hearing? 0 0  Vision? 0 0  Comment wears contacts   Difficulty concentrating or making decisions? 1 0  Comment remembering things at times   Walking or climbing stairs? 0   Dressing or bathing? 0 0  Doing errands, shopping? 0 0  Preparing Food and eating ? N N  Using the Toilet? N N  In the past six months, have you accidently leaked urine? N Y  Do you have problems with loss of bowel control? N N  Managing your Medications? N N  Managing your Finances? N N  Housekeeping or managing your Housekeeping? N N    Patient Care Team: Sherlene Shams, MD as PCP - General (Internal Medicine) Debbe Odea, MD as PCP - Cardiology (Cardiology)  Indicate any recent Medical Services you may have received from other than [facility] providers in the past year (date may be approximate).     Assessment:   This is a routine wellness examination for [place].  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 0 0 0 0 0  Number falls in past yr: 0 0 0 0 0  Injury with Fall? 0  0 0 0  Risk for fall due to : No Fall Risks  No Fall Risks No Fall Risks No Fall Risks  Follow up Falls prevention discussed;Falls evaluation completed  Falls evaluation completed Falls evaluation completed Falls evaluation completed    MEDICARE RISK AT HOME: Medicare Risk at Home Any stairs in or around the home?: Yes If so, are there any without handrails?: No Home free of loose throw rugs in walkways, pet beds, electrical cords, etc?: Yes Adequate lighting in your home to reduce risk of falls?: Yes Life alert?: No Use of a cane, walker or w/c?: No Grab bars in the bathroom?: No Shower chair or bench in shower?: Yes Elevated toilet seat or a handicapped toilet?: Yes  TIMED UP AND GO:  Was the test performed?  Yes  Length of time to ambulate 10 feet: 8 sec Gait steady and fast without use of assistive device    Cognitive Function:        03/23/2023   10:08 AM  6CIT Screen  What Year? 0 points  What month? 0 points  What time? 0 points  Count back from 20 0 points  Months in reverse 0 points  Repeat phrase 2 points  Total Score 2 points  Immunizations Immunization History  Administered Date(s) Administered   Fluad Quad(high Dose 65+) 06/05/2021, 09/05/2022   Influenza,inj,Quad PF,6+ Mos 06/29/2013, 05/02/2015, 04/28/2016, 06/16/2018   Influenza-Unspecified 05/28/2017, 04/24/2019   PFIZER(Purple Top)SARS-COV-2 Vaccination 08/11/2019, 09/01/2019, 05/03/2020, 11/02/2020   PNEUMOCOCCAL CONJUGATE-20 12/02/2021   Tdap 03/17/2008, 12/31/2021   Zoster Recombinant(Shingrix) 12/06/2020, 03/13/2021    TDAP status: Up to date  Flu Vaccine status: Up to date  Pneumococcal  vaccine status: Up to date  Covid-19 vaccine status: Completed vaccines  Qualifies for Shingles Vaccine? Yes   Zostavax completed No   Shingrix Completed?: Yes  Screening Tests Health Maintenance  Topic Date Due   DEXA SCAN  Never done   COVID-19 Vaccine (5 - 2023-24 season) 04/04/2022   Colonoscopy  12/27/2022   Medicare Annual Wellness (AWV)  03/18/2023   INFLUENZA VACCINE  03/05/2023   MAMMOGRAM  01/07/2024   DTaP/Tdap/Td (3 - Td or Tdap) 01/01/2032   Pneumonia Vaccine 39+ Years old  Completed   Hepatitis C Screening  Completed   Zoster Vaccines- Shingrix  Completed   HPV VACCINES  Aged Out    Health Maintenance  Health Maintenance Due  Topic Date Due   DEXA SCAN  Never done   COVID-19 Vaccine (5 - 2023-24 season) 04/04/2022   Colonoscopy  12/27/2022   Medicare Annual Wellness (AWV)  03/18/2023   INFLUENZA VACCINE  03/05/2023    Colorectal cancer screening: Type of screening: Colonoscopy. Completed 5/21. Repeat every 3 years Patient provided telephone number to GI doctor  Mammogram status: Completed 6/24. Repeat every year  Bone Density status: Ordered 03/23/23. Pt provided with contact info and advised to call to schedule appt.  Lung Cancer Screening: (Low Dose CT Chest recommended if Age 27-80 years, 20 pack-year currently smoking OR have quit w/in 15years.) does not qualify.    Additional Screening:  Hepatitis C Screening: does qualify; Completed 10/17  Vision Screening: Recommended annual ophthalmology exams for early detection of glaucoma and other disorders of the eye. Is the patient up to date with their annual eye exam?  Yes  Who is the provider or what is the name of the office in which the patient attends annual eye exams? [facility] If pt is not established with a provider, would they like to be referred to a provider to establish care? No .   Dental Screening: Recommended annual dental exams for proper oral hygiene   Community Resource Referral  / Chronic Care Management: CRR required this visit?  No   CCM required this visit?  No     Plan:     I have personally reviewed and noted the following in the patient's chart:   Medical and social history Use of alcohol, tobacco or illicit drugs  Current medications and supplements including opioid prescriptions. Patient is not currently taking opioid prescriptions. Functional ability and status Nutritional status Physical activity Advanced directives List of other physicians Hospitalizations, surgeries, and ER visits in previous 12 months Vitals Screenings to include cognitive, depression, and falls Referrals and appointments  In addition, I have reviewed and discussed with patient certain preventive protocols, quality metrics, and best practice recommendations. A written personalized care plan for preventive services as well as general preventive health recommendations were provided to patient.     Sydell Axon, LPN   4/54/0981   After Visit Summary: (MyChart) Due to this being a telephonic visit, the after visit summary with patients personalized plan was offered to patient via MyChart   Nurse Notes: None  I have reviewed the above information and agree with above.   Duncan Dull, MD

## 2023-03-23 NOTE — Progress Notes (Signed)
PPD Placement note Sarah Delgado, 67 y.o. female is here today for placement of PPD test Reason for PPD test: work Pt taken PPD test before: yes Verified in allergy area and with patient that they are not allergic to the products PPD is made of (Phenol or Tween). Yes Is patient taking any oral or IV steroid medication now or have they taken it in the last month? no Has the patient ever received the BCG vaccine?: yes Has the patient been in recent contact with anyone known or suspected of having active TB disease?: no      Date of exposure (if applicable): na      Name of person they were exposed to (if applicable): na Patient's Country of origin?: [place] O: Alert and oriented in NAD. P:  PPD placed on 03/23/2023.  Patient advised to return for reading within 48-72 hours.

## 2023-03-23 NOTE — Telephone Encounter (Signed)
Pt in office for Nurse visit for PPD placement.  While here pt reported that she got an insect bite on Saturday 03/21/23.  Bite is about he size of a silver dollar with a brown spot in the center.  The area is raised.  Pt denies pain but does report itching. Made pt an appointment 03/24/23 at 3pm with Bethanie Dicker, NP.  Asked pt to go to ED/UC if she begins to have fever or pain prior to appointment tomorrow.  Pt verbalized understanding.

## 2023-03-24 ENCOUNTER — Ambulatory Visit: Payer: Medicare PPO | Admitting: Nurse Practitioner

## 2023-03-24 ENCOUNTER — Encounter: Payer: Self-pay | Admitting: Nurse Practitioner

## 2023-03-24 VITALS — BP 118/64 | HR 64 | Temp 98.6°F | Ht 66.0 in | Wt 160.2 lb

## 2023-03-24 DIAGNOSIS — S20461A Insect bite (nonvenomous) of right back wall of thorax, initial encounter: Secondary | ICD-10-CM | POA: Diagnosis not present

## 2023-03-24 DIAGNOSIS — W57XXXA Bitten or stung by nonvenomous insect and other nonvenomous arthropods, initial encounter: Secondary | ICD-10-CM | POA: Diagnosis not present

## 2023-03-24 DIAGNOSIS — L089 Local infection of the skin and subcutaneous tissue, unspecified: Secondary | ICD-10-CM

## 2023-03-24 HISTORY — DX: Local infection of the skin and subcutaneous tissue, unspecified: L08.9

## 2023-03-24 MED ORDER — CEPHALEXIN 500 MG PO CAPS: 500.0000 mg | ORAL_CAPSULE | Freq: Three times a day (TID) | ORAL | 0 refills | Status: AC

## 2023-03-24 NOTE — Assessment & Plan Note (Signed)
Concern for cellulitis. Erythema, swelling and warmth present. Will treat with Keflex 500 mg TID x 7 days. She will continue Hydrocortisone cream as needed. Also advised OTC antihistamine such as Benadryl or Zyrtec daily to help with itching. Strict precautions given to patient.

## 2023-03-24 NOTE — Progress Notes (Signed)
Bethanie Dicker, NP-C Phone: [phone]  Sarah Delgado is a 67 y.o. female who presents today for insect bite.  Patient with insect bite on right side of back. She believes she was bitten on Friday, she is unsure what kind of insect it was that bit her. It has been warm and pruritic. Denies pain. Denies fever or chills. Denies lymphadenopathy. Denies headaches, fatigue, myalgias. She started using hydrocortisone cream yesterday which has helped some with the itching.   Social History   Tobacco Use  Smoking Status Former   Types: Cigarettes   Start date: 11/18/2016  Smokeless Tobacco Never    Current Outpatient Medications on File Prior to Visit  Medication Sig Dispense Refill   aspirin EC 81 MG tablet Take 81 mg by mouth daily.     atorvastatin (LIPITOR) 10 MG tablet TAKE 1 TABLET BY MOUTH EVERY DAY 90 tablet 3   Biotin 1000 MCG tablet Take 1,000 mcg by mouth daily.      Cholecalciferol (VITAMIN D-3) 1000 units CAPS Take 1 capsule by mouth daily.     Coenzyme Q10 10 MG capsule Take 10 mg by mouth daily.     estradiol (ESTRACE) 0.5 MG tablet Take 1 tablet (0.5 mg total) by mouth daily. 90 tablet 3   Semaglutide-Weight Management (WEGOVY) 1.7 MG/0.75ML SOAJ INJECT 1.7 MG INTO THE SKIN ONCE A WEEK FOR 28 DAYS. 3 mL 1   triamterene-hydrochlorothiazide (MAXZIDE-25) 37.5-25 MG tablet Take 0.5 tablets by mouth daily. 45 tablet 1   vitamin B-12 (CYANOCOBALAMIN) 500 MCG tablet Take 500 mcg daily by mouth.     vitamin E 180 MG (400 UNITS) capsule Take 400 Units daily by mouth.     No current facility-administered medications on file prior to visit.    ROS see history of present illness  Objective  Physical Exam Vitals:   03/24/23 1531  BP: 118/64  Pulse: 64  Temp: 98.6 F (37 C)  SpO2: 99%    BP Readings from Last 3 Encounters:  03/24/23 118/64  03/23/23 110/68  03/04/23 118/80   Wt Readings from Last 3 Encounters:  03/24/23 160 lb 3.2 oz (72.7 kg)  03/23/23 159 lb 4 oz  (72.2 kg)  03/04/23 158 lb 3.2 oz (71.8 kg)    Physical Exam Constitutional:      General: She is not in acute distress.    Appearance: Normal appearance.  HENT:     Head: Normocephalic.  Cardiovascular:     Rate and Rhythm: Normal rate and regular rhythm.     Heart sounds: Normal heart sounds.  Pulmonary:     Effort: Pulmonary effort is normal.     Breath sounds: Normal breath sounds.  Skin:    General: Skin is warm and dry.     Findings: Lesion (insect bite- see picture below, noted on right side of back, just below braline) present.  Neurological:     General: No focal deficit present.     Mental Status: She is alert.  Psychiatric:        Mood and Affect: Mood normal.        Behavior: Behavior normal.      Assessment/Plan: Please see individual problem list.  Insect bite of right side of back with infection Assessment & Plan: Concern for cellulitis. Erythema, swelling and warmth present. Will treat with Keflex 500 mg TID x 7 days. She will continue Hydrocortisone cream as needed. Also advised OTC antihistamine such as Benadryl or Zyrtec daily to help with itching.  Strict precautions given to patient.  Orders: -     Cephalexin; Take 1 capsule (500 mg total) by mouth 3 (three) times daily for 7 days.  Dispense: 21 capsule; Refill: 0   Return if symptoms worsen or fail to improve.   Bethanie Dicker, NP-C [facility]

## 2023-03-26 ENCOUNTER — Ambulatory Visit (INDEPENDENT_AMBULATORY_CARE_PROVIDER_SITE_OTHER): Payer: Medicare PPO

## 2023-03-26 DIAGNOSIS — Z23 Encounter for immunization: Secondary | ICD-10-CM

## 2023-03-26 LAB — TB SKIN TEST
Induration: 0 mm
TB Skin Test: NEGATIVE

## 2023-03-26 NOTE — Progress Notes (Signed)
PPD Reading Note  PPD read and results entered in EpicCare.  Result: 0 mm induration.  Interpretation: Negative  If test not read within 48-72 hours of initial placement, patient advised to repeat in other arm 1-3 weeks after this test.  Allergic reaction: no

## 2023-03-26 NOTE — Progress Notes (Signed)
Patient arrived for a Hep B vaccine it was administered into her left deltoid. Patient tolerated the injection well and did not show any signs of distress or voice any concerns.

## 2023-04-07 ENCOUNTER — Other Ambulatory Visit: Payer: Self-pay | Admitting: Internal Medicine

## 2023-04-10 ENCOUNTER — Ambulatory Visit: Payer: Medicare PPO | Admitting: Internal Medicine

## 2023-04-13 ENCOUNTER — Ambulatory Visit: Payer: Medicare PPO | Admitting: Internal Medicine

## 2023-05-23 ENCOUNTER — Other Ambulatory Visit: Payer: Self-pay | Admitting: Internal Medicine

## 2023-05-23 DIAGNOSIS — I10 Essential (primary) hypertension: Secondary | ICD-10-CM

## 2023-05-27 ENCOUNTER — Encounter: Payer: Self-pay | Admitting: Internal Medicine

## 2023-05-27 ENCOUNTER — Ambulatory Visit: Payer: Medicare PPO | Admitting: Internal Medicine

## 2023-05-27 VITALS — BP 122/64 | HR 70 | Ht 66.0 in | Wt 159.4 lb

## 2023-05-27 DIAGNOSIS — R944 Abnormal results of kidney function studies: Secondary | ICD-10-CM

## 2023-05-27 DIAGNOSIS — R7303 Prediabetes: Secondary | ICD-10-CM | POA: Diagnosis not present

## 2023-05-27 DIAGNOSIS — R002 Palpitations: Secondary | ICD-10-CM | POA: Diagnosis not present

## 2023-05-27 DIAGNOSIS — I10 Essential (primary) hypertension: Secondary | ICD-10-CM | POA: Diagnosis not present

## 2023-05-27 DIAGNOSIS — N6320 Unspecified lump in the left breast, unspecified quadrant: Secondary | ICD-10-CM

## 2023-05-27 DIAGNOSIS — Z23 Encounter for immunization: Secondary | ICD-10-CM | POA: Diagnosis not present

## 2023-05-27 DIAGNOSIS — E785 Hyperlipidemia, unspecified: Secondary | ICD-10-CM

## 2023-05-27 DIAGNOSIS — Z1211 Encounter for screening for malignant neoplasm of colon: Secondary | ICD-10-CM

## 2023-05-27 MED ORDER — PROPRANOLOL HCL 10 MG PO TABS: 5.0000 mg | ORAL_TABLET | ORAL | 0 refills | Status: DC | PRN

## 2023-05-27 NOTE — Patient Instructions (Addendum)
REFERRAL TO GI IS IN PROCESS FOR YOUR COLONOSCOPY  I HAVE CALLED IN A MEDICATION CALLED PROPRANOLOL.  YOU MAY USE THIS AS NEEDED FOR RECURRENT PVC'S  (IRREGULAR HEART BEATS).  1/2 TABLET

## 2023-05-27 NOTE — Progress Notes (Unsigned)
Subjective:  Patient ID: Sarah Delgado, female    DOB: 1956/04/30  Age: 67 y.o. MRN: [id number]  CC: The primary encounter diagnosis was Colon cancer screening. Diagnoses of Essential hypertension, benign, Hyperlipidemia LDL goal <100, and Prediabetes were also pertinent to this visit.   HPI Sarah Delgado presents for  Chief Complaint  Patient presents with   Medical Management of Chronic Issues    6 month follow up    1) Weight management with wegovy  2) Grief  3) HLD  4) HTN:    5) ARRHYTHMIA:  NON INVASIVE  WORKUP NORMAL /NEGATIVE FOR ISCHEMIA     Outpatient Medications Prior to Visit  Medication Sig Dispense Refill   aspirin EC 81 MG tablet Take 81 mg by mouth daily.     atorvastatin (LIPITOR) 10 MG tablet TAKE 1 TABLET BY MOUTH EVERY DAY 90 tablet 3   Biotin 1000 MCG tablet Take 1,000 mcg by mouth daily.      Cholecalciferol (VITAMIN D-3) 1000 units CAPS Take 1 capsule by mouth daily.     Coenzyme Q10 10 MG capsule Take 10 mg by mouth daily.     estradiol (ESTRACE) 0.5 MG tablet Take 1 tablet (0.5 mg total) by mouth daily. 90 tablet 3   Semaglutide-Weight Management (WEGOVY) 1.7 MG/0.75ML SOAJ INJECT 1.7 MG INTO THE SKIN ONCE A WEEK FOR 28 DAYS. 3 mL 1   triamterene-hydrochlorothiazide (MAXZIDE-25) 37.5-25 MG tablet Take 0.5 tablets by mouth daily. 45 tablet 1   vitamin B-12 (CYANOCOBALAMIN) 500 MCG tablet Take 500 mcg daily by mouth.     vitamin E 180 MG (400 UNITS) capsule Take 400 Units daily by mouth.     No facility-administered medications prior to visit.    Review of Systems;  Patient denies headache, fevers, malaise, unintentional weight loss, skin rash, eye pain, sinus congestion and sinus pain, sore throat, dysphagia,  hemoptysis , cough, dyspnea, wheezing, chest pain, palpitations, orthopnea, edema, abdominal pain, nausea, melena, diarrhea, constipation, flank pain, dysuria, hematuria, urinary  Frequency, nocturia, numbness, tingling, seizures,   Focal weakness, Loss of consciousness,  Tremor, insomnia, depression, anxiety, and suicidal ideation.      Objective:  BP 122/64   Pulse 70   Ht 5\' 6"  (1.676 m)   Wt 159 lb 6.4 oz (72.3 kg)   SpO2 98%   BMI 25.73 kg/m   BP Readings from Last 3 Encounters:  05/27/23 122/64  03/24/23 118/64  03/23/23 110/68    Wt Readings from Last 3 Encounters:  05/27/23 159 lb 6.4 oz (72.3 kg)  03/24/23 160 lb 3.2 oz (72.7 kg)  03/23/23 159 lb 4 oz (72.2 kg)    Physical Exam  Lab Results  Component Value Date   HGBA1C 5.8 12/02/2021   HGBA1C 6.1 06/05/2021   HGBA1C 5.9 11/12/2020    Lab Results  Component Value Date   CREATININE 1.05 09/05/2022   CREATININE 0.97 12/02/2021   CREATININE 0.96 09/09/2021    Lab Results  Component Value Date   WBC 6.7 09/05/2022   HGB 15.2 09/05/2022   HCT 43.7 09/05/2022   PLT 362 09/05/2022   GLUCOSE 81 09/05/2022   CHOL 214 (H) 12/02/2021   TRIG 127.0 12/02/2021   HDL 72.80 12/02/2021   LDLDIRECT 122.0 12/02/2021   LDLCALC 116 (H) 12/02/2021   ALT 11 09/05/2022   AST 15 09/05/2022   NA 135 09/05/2022   K 4.0 09/05/2022   CL 98 09/05/2022   CREATININE 1.05 09/05/2022  BUN 18 09/05/2022   CO2 25 09/05/2022   TSH 1.21 10/08/2022   HGBA1C 5.8 12/02/2021   MICROALBUR <0.7 06/11/2017    NM Myocar Multi W/Spect W/Wall Motion / EF  Result Date: 01/10/2023 Exercise myocardial perfusion imaging study with no significant  ischemia Normal wall motion, EF estimated at 95% No EKG changes concerning for ischemia at peak stress or in recovery. Target heart rate achieved, 91% of maximum predicted heart rate Peak heart rate 139 bpm, 7 METS, exercise time 8 minutes CT attenuation correction images with no significant aortic atherosclerosis or coronary calcification Low risk scan Signed, Dossie Arbour, MD, Ph.D [facility]    Assessment & Plan:  .Colon cancer screening  Essential hypertension, benign  Hyperlipidemia LDL goal  <100  Prediabetes     I provided 30 minutes of face-to-face time during this encounter reviewing patient's last visit with me, patient's  most recent visit with cardiology,  nephrology,  and neurology,  recent surgical and non surgical procedures, previous  labs and imaging studies, counseling on currently addressed issues,  and post visit ordering to diagnostics and therapeutics .   Follow-up: No follow-ups on file.   Sherlene Shams, MD

## 2023-05-28 ENCOUNTER — Encounter: Payer: Self-pay | Admitting: Internal Medicine

## 2023-05-28 ENCOUNTER — Telehealth: Payer: Self-pay | Admitting: *Deleted

## 2023-05-28 ENCOUNTER — Other Ambulatory Visit: Payer: Self-pay

## 2023-05-28 ENCOUNTER — Telehealth: Payer: Self-pay

## 2023-05-28 DIAGNOSIS — Z860100 Personal history of colon polyps, unspecified: Secondary | ICD-10-CM

## 2023-05-28 LAB — COMPREHENSIVE METABOLIC PANEL
ALT: 11 U/L (ref 0–35)
AST: 16 U/L (ref 0–37)
Albumin: 4.6 g/dL (ref 3.5–5.2)
Alkaline Phosphatase: 42 U/L (ref 39–117)
BUN: 14 mg/dL (ref 6–23)
CO2: 30 meq/L (ref 19–32)
Calcium: 10.4 mg/dL (ref 8.4–10.5)
Chloride: 99 meq/L (ref 96–112)
Creatinine, Ser: 1.06 mg/dL (ref 0.40–1.20)
GFR: 53.98 mL/min — ABNORMAL LOW (ref >60.00)
Glucose, Bld: 79 mg/dL (ref 70–99)
Potassium: 4.1 meq/L (ref 3.5–5.1)
Sodium: 137 meq/L (ref 135–145)
Total Bilirubin: 0.7 mg/dL (ref 0.2–1.2)
Total Protein: 7.8 g/dL (ref 6.0–8.3)

## 2023-05-28 LAB — MICROALBUMIN / CREATININE URINE RATIO
Creatinine,U: 198.1 mg/dL
Microalb Creat Ratio: 0.5 mg/g (ref 0.0–30.0)
Microalb, Ur: 1 mg/dL (ref 0.0–1.9)

## 2023-05-28 LAB — LIPID PANEL
Cholesterol: 223 mg/dL — ABNORMAL HIGH (ref 0–200)
HDL: 83.3 mg/dL (ref >39.00)
LDL Cholesterol: 119 mg/dL — ABNORMAL HIGH (ref 0–99)
NonHDL: 139.92
Total CHOL/HDL Ratio: 3
Triglycerides: 103 mg/dL (ref 0.0–149.0)
VLDL: 20.6 mg/dL (ref 0.0–40.0)

## 2023-05-28 LAB — LDL CHOLESTEROL, DIRECT: Direct LDL: 119 mg/dL

## 2023-05-28 LAB — HEMOGLOBIN A1C: Hgb A1c MFr Bld: 5.7 % (ref 4.6–6.5)

## 2023-05-28 NOTE — Telephone Encounter (Signed)
Pt has been scheduled tele pre op appt 06/12/23. Med rec and consent are done.

## 2023-05-28 NOTE — Assessment & Plan Note (Signed)
Her a1c  Has simproved with  use of Wegovy for weight management.  Low GI diet  And regular participation in  exercise .    Lab Results  Component Value Date   HGBA1C 5.7 05/27/2023

## 2023-05-28 NOTE — Telephone Encounter (Signed)
Pre-operative Risk Assessment    Patient Name: Sarah Delgado  DOB: Nov 03, 1955 MRN: [id number]    DATE OF LAST VISIT: 03/04/23 DR. AGBOR-ETANG DATE OF NEXT VISIT: NONE   Request for Surgical Clearance    Procedure:   COLONOSCOPY  Date of Surgery:  Clearance 06/25/23                                 Surgeon:  DR. Tobi Bastos Surgeon's Group or Practice Name:  [facility] Phone number:  [phone] Fax number:  [phone]   Type of Clearance Requested:   - Medical ; PER CLEARANCE FORM NO MEDICATIONS NEEDING TO BE HELD   Type of Anesthesia:  General    Additional requests/questions:    Elpidio Anis   05/28/2023, 2:45 PM

## 2023-05-28 NOTE — Assessment & Plan Note (Signed)
Managed with  Lipitor taken daily for evidence of cerebrovascular disease on prior CT  Lab Results  Component Value Date   CHOL 223 (H) 05/27/2023   HDL 83.30 05/27/2023   LDLCALC 119 (H) 05/27/2023   LDLDIRECT 119.0 05/27/2023   TRIG 103.0 05/27/2023   CHOLHDL 3 05/27/2023    Lab Results  Component Value Date   ALT 11 05/27/2023   AST 16 05/27/2023   ALKPHOS 42 05/27/2023   BILITOT 0.7 05/27/2023

## 2023-05-28 NOTE — Telephone Encounter (Signed)
Primary Cardiologist:Brian Agbor-Etang, MD   Preoperative team, please contact this patient and set up a phone call appointment for further preoperative risk assessment. Please obtain consent and complete medication review. Thank you for your help.   There is no specific cardiac indication for aspirin should it need to be held.   I also confirmed the patient resides in the state of [place]. As per [facility] Medical Board telemedicine laws, the patient must reside in the state in which the provider is licensed.   Levi Aland, NP-C  05/28/2023, 3:33 PM [address], [address] Office [phone] Fax [phone]

## 2023-05-28 NOTE — Telephone Encounter (Signed)
Pt has been scheduled tele pre op appt 06/12/23. Med rec and consent are done.     Patient Consent for Virtual Visit        [name] has provided verbal consent on 05/28/2023 for a virtual visit (video or telephone).   CONSENT FOR VIRTUAL VISIT FOR:  Sarah Delgado  By participating in this virtual visit I agree to the following:  I hereby voluntarily request, consent and authorize [facility] and its employed or contracted physicians, physician assistants, nurse practitioners or other licensed health care professionals (the Practitioner), to provide me with telemedicine health care services (the "Services") as deemed necessary by the treating Practitioner. I acknowledge and consent to receive the Services by the Practitioner via telemedicine. I understand that the telemedicine visit will involve communicating with the Practitioner through live audiovisual communication technology and the disclosure of certain medical information by electronic transmission. I acknowledge that I have been given the opportunity to request an in-person assessment or other available alternative prior to the telemedicine visit and am voluntarily participating in the telemedicine visit.  I understand that I have the right to withhold or withdraw my consent to the use of telemedicine in the course of my care at any time, without affecting my right to future care or treatment, and that the Practitioner or I may terminate the telemedicine visit at any time. I understand that I have the right to inspect all information obtained and/or recorded in the course of the telemedicine visit and may receive copies of available information for a reasonable fee.  I understand that some of the potential risks of receiving the Services via telemedicine include:  Delay or interruption in medical evaluation due to technological equipment failure or disruption; Information transmitted may not be sufficient (e.g. poor  resolution of images) to allow for appropriate medical decision making by the Practitioner; and/or  In rare instances, security protocols could fail, causing a breach of personal health information.  Furthermore, I acknowledge that it is my responsibility to provide information about my medical history, conditions and care that is complete and accurate to the best of my ability. I acknowledge that Practitioner's advice, recommendations, and/or decision may be based on factors not within their control, such as incomplete or inaccurate data provided by me or distortions of diagnostic images or specimens that may result from electronic transmissions. I understand that the practice of medicine is not an exact science and that Practitioner makes no warranties or guarantees regarding treatment outcomes. I acknowledge that a copy of this consent can be made available to me via my patient portal [facility] MyChart), or I can request a printed copy by calling the office of [facility].    I understand that my insurance will be billed for this visit.   I have read or had this consent read to me. I understand the contents of this consent, which adequately explains the benefits and risks of the Services being provided via telemedicine.  I have been provided ample opportunity to ask questions regarding this consent and the Services and have had my questions answered to my satisfaction. I give my informed consent for the services to be provided through the use of telemedicine in my medical care

## 2023-05-28 NOTE — Assessment & Plan Note (Signed)
Well controlled on maxzide . Renal function stable, no changes today.  Lab Results  Component Value Date   CREATININE 1.06 05/27/2023   Lab Results  Component Value Date   NA 137 05/27/2023   K 4.1 05/27/2023   CL 99 05/27/2023   CO2 30 05/27/2023

## 2023-05-28 NOTE — Assessment & Plan Note (Addendum)
Workup negative for atrial fibrillation    prescribing inderal 5 mg prn prolonged episodes of PVC's

## 2023-05-28 NOTE — Telephone Encounter (Signed)
Gastroenterology Pre-Procedure Review  Request Date: 06/25/23 Requesting Physician: Dr. Tobi Bastos  PATIENT REVIEW QUESTIONS: The patient responded to the following health history questions as indicated:    1. Are you having any GI issues? no 2. Do you have a personal history of Polyps? yes (last colonoscopy was with Dr. Tobi Bastos 12/27/19 recommended repeat in 3 years) 3. Do you have a family history of Colon Cancer or Polyps? no 4. Diabetes Mellitus? no 5. Joint replacements in the past 12 months?no 6. Major health problems in the past 3 months?no 7. Any artificial heart valves, MVP, or defibrillator?no  8. Cardiac history? Yes preop sent to [facility] Preop 9. Weight loss meds? Lavella Hammock has been advised to stop 7 days before MEDICATIONS & ALLERGIES:    Patient reports the following regarding taking any anticoagulation/antiplatelet therapy:   Plavix, Coumadin, Eliquis, Xarelto, Lovenox, Pradaxa, Brilinta, or Effient? no Aspirin? 81 mg  Patient confirms/reports the following medications:  Current Outpatient Medications  Medication Sig Dispense Refill   aspirin EC 81 MG tablet Take 81 mg by mouth daily.     atorvastatin (LIPITOR) 10 MG tablet TAKE 1 TABLET BY MOUTH EVERY DAY 90 tablet 3   Biotin 1000 MCG tablet Take 1,000 mcg by mouth daily.      Cholecalciferol (VITAMIN D-3) 1000 units CAPS Take 1 capsule by mouth daily.     Coenzyme Q10 10 MG capsule Take 10 mg by mouth daily.     estradiol (ESTRACE) 0.5 MG tablet Take 1 tablet (0.5 mg total) by mouth daily. 90 tablet 3   propranolol (INDERAL) 10 MG tablet Take 0.5 tablets (5 mg total) by mouth as needed. FOR PALPITATIONS 30 tablet 0   Semaglutide-Weight Management (WEGOVY) 1.7 MG/0.75ML SOAJ INJECT 1.7 MG INTO THE SKIN ONCE A WEEK FOR 28 DAYS. 3 mL 1   triamterene-hydrochlorothiazide (MAXZIDE-25) 37.5-25 MG tablet Take 0.5 tablets by mouth daily. 45 tablet 1   vitamin B-12 (CYANOCOBALAMIN) 500 MCG tablet Take 500 mcg daily by mouth.     vitamin E  180 MG (400 UNITS) capsule Take 400 Units daily by mouth.     No current facility-administered medications for this visit.    Patient confirms/reports the following allergies:  No Known Allergies  No orders of the defined types were placed in this encounter.   AUTHORIZATION INFORMATION Primary Insurance: 1D#: Group #:  Secondary Insurance: 1D#: Group #:  SCHEDULE INFORMATION: Date:  Time: Location:

## 2023-05-28 NOTE — Assessment & Plan Note (Signed)
S/p stereotactic biopsy (first biopsy done via U/S missed the mass). In May 2022.   Biopsy report  pseudoangiomatous stromal hyperplasia. Findings are concordant with imaging per radiology .  Continue annual screening

## 2023-05-29 ENCOUNTER — Telehealth: Payer: Self-pay | Admitting: Cardiology

## 2023-05-29 NOTE — Addendum Note (Signed)
Addended by: Sherlene Shams on: 05/29/2023 02:43 PM   Modules accepted: Orders

## 2023-05-29 NOTE — Telephone Encounter (Signed)
Preop Televisit now rescheduled

## 2023-05-29 NOTE — Telephone Encounter (Signed)
Pt is calling back to r/s telephone visit for pre-op

## 2023-06-03 ENCOUNTER — Ambulatory Visit
Admission: RE | Admit: 2023-06-03 | Discharge: 2023-06-03 | Disposition: A | Discharge status: Home / Self Care | Payer: Medicare PPO | Source: Ambulatory Visit | Attending: Internal Medicine | Admitting: Internal Medicine

## 2023-06-03 DIAGNOSIS — Z780 Asymptomatic menopausal state: Secondary | ICD-10-CM | POA: Insufficient documentation

## 2023-06-12 ENCOUNTER — Telehealth: Payer: Medicare PPO

## 2023-06-14 NOTE — Progress Notes (Unsigned)
Virtual Visit via Telephone Note   Because of Sarah Delgado co-morbid illnesses, she is at least at moderate risk for complications without adequate follow up.  This format is felt to be most appropriate for this patient at this time.  The patient did not have access to video technology/had technical difficulties with video requiring transitioning to audio format only (telephone).  All issues noted in this document were discussed and addressed.  No physical exam could be performed with this format.  Please refer to the patient's chart for her consent to telehealth for [facility].  Evaluation Performed:  Preoperative cardiovascular risk assessment _____________   Date:  06/15/2023   Patient ID:  Sarah Delgado, DOB 04-02-1956, MRN [id number] Patient Location:  Home Provider location:   Office  Primary Care Provider:  Sherlene Shams, MD Primary Cardiologist:  Debbe Odea, MD  Chief Complaint / Patient Profile   67 y.o. y/o female with a h/o hyperlipidemia, frequent PVCs, and tobacco abuse in remission.  She is pending, colonoscopy with Dr. Tobi Bastos at [facility] on 06/25/2023.  She presents today for telephonic preoperative cardiovascular risk assessment.  History of Present Illness    Sarah Delgado is a 67 y.o. female who presents via audio/video conferencing for a telehealth visit today.  Pt was last seen in cardiology clinic on 03/04/2023 by Agbor-Etang.  Recent [employer] with no significant ischemia.  At that time Sarah Delgado was doing well .  The patient is now pending procedure as outlined above. Since her last visit, she has had rare palpitations, not lasting long.  Not had any for 2 weeks. She has   Past Medical History    Past Medical History:  Diagnosis Date   Chicken pox    Hyperlipidemia    Hypertension    Insect bite of right side of back with infection 03/24/2023   Tobacco abuse 09/16/2012   Social smoker on weekends.     Past  Surgical History:  Procedure Laterality Date   ABDOMINAL HYSTERECTOMY     BREAST BIOPSY Left 12/14/2020   cylinder marker, BENIGN BREAST TISSUE WITH: FIBROCYSTIC CHANGES WITH ASSOCIATED CALCIFICATIONS, USUAL DUCTAL HYPERPLASIA , DENSE STROMAL FIBROSIS, NEGATIVE FOR AYPTIA OR MALIGNANCY   BREAST SURGERY  2000   reduction   COLONOSCOPY WITH PROPOFOL N/A 12/27/2019   Procedure: COLONOSCOPY WITH PROPOFOL;  Surgeon: Wyline Mood, MD;  Location: [facility] ENDOSCOPY;  Service: Gastroenterology;  Laterality: N/A;   REDUCTION MAMMAPLASTY Bilateral    at least 15 years ago   TOTAL ABDOMINAL HYSTERECTOMY W/ BILATERAL SALPINGOOPHORECTOMY  2005    Allergies  No Known Allergies  Home Medications    Prior to Admission medications   Medication Sig Start Date End Date Taking? Authorizing Provider  aspirin EC 81 MG tablet Take 81 mg by mouth daily.    [provider]  atorvastatin (LIPITOR) 10 MG tablet TAKE 1 TABLET BY MOUTH EVERY DAY 11/27/22   Sherlene Shams, MD  Biotin 1000 MCG tablet Take 1,000 mcg by mouth daily.     [provider]  Cholecalciferol (VITAMIN D-3) 1000 units CAPS Take 1 capsule by mouth daily.    [provider]  Coenzyme Q10 10 MG capsule Take 10 mg by mouth daily.    [provider]  estradiol (ESTRACE) 0.5 MG tablet Take 1 tablet (0.5 mg total) by mouth daily. 10/08/22   Sherlene Shams, MD  propranolol (INDERAL) 10 MG tablet Take 0.5 tablets (5 mg total)  by mouth as needed. FOR PALPITATIONS 05/27/23   Sherlene Shams, MD  Semaglutide-Weight Management (WEGOVY) 1.7 MG/0.75ML SOAJ INJECT 1.7 MG INTO THE SKIN ONCE A WEEK FOR 28 DAYS. 04/08/23   Sherlene Shams, MD  triamterene-hydrochlorothiazide (MAXZIDE-25) 37.5-25 MG tablet Take 0.5 tablets by mouth daily. 05/25/23   Sherlene Shams, MD  vitamin B-12 (CYANOCOBALAMIN) 500 MCG tablet Take 500 mcg daily by mouth.    [provider]  vitamin E 180 MG (400 UNITS) capsule Take 400 Units daily by  mouth.    [provider]    Physical Exam    Vital Signs:  Sarah Delgado does not have vital signs available for review today.   Given telephonic nature of communication, physical exam is limited. AAOx3. NAD. Normal affect.  Speech and respirations are unlabored.  Accessory Clinical Findings    None  Assessment & Plan    1.  Preoperative Cardiovascular Risk Assessment: According to the Revised Cardiac Risk Index (RCRI), her Perioperative Risk of Major Cardiac Event is (%): 0.4  Her Functional Capacity in METs is: 9.89 according to the Duke Activity Status Index (DASI).   The patient was advised that if she develops new symptoms prior to surgery to contact our office to arrange for a follow-up visit, and she verbalized understanding.  Per office protocol, if patient is without any new symptoms or concerns at the time of their virtual visit, he/she may hold ASA for 7 days prior to procedure. Please resume ASA as soon as possible postprocedure, at the discretion of the surgeon.    Although she is prescribed Wegovy, she has not had any refills. She is aware to hold the [facility] for one week prior to the procedure if she is able to get the medication refilled before the colonoscopy.   Therefore, based on ACC/AHA guidelines, patient would be at acceptable risk for the planned procedure without further cardiovascular testing. I will route this recommendation to the requesting party via Epic fax function.   A copy of this note will be routed to requesting surgeon.  Time:   Today, I have spent 10 minutes with the patient with telehealth technology discussing medical history, symptoms, and management plan.     Joni Reining, NP  06/15/2023, 9:47 AM

## 2023-06-15 ENCOUNTER — Telehealth: Payer: Self-pay

## 2023-06-15 ENCOUNTER — Ambulatory Visit: Payer: Medicare PPO | Attending: Cardiology

## 2023-06-15 DIAGNOSIS — Z01810 Encounter for preprocedural cardiovascular examination: Secondary | ICD-10-CM | POA: Diagnosis not present

## 2023-06-15 DIAGNOSIS — Z01818 Encounter for other preprocedural examination: Secondary | ICD-10-CM

## 2023-06-15 NOTE — Telephone Encounter (Signed)
Cardiac Clearance Granted  1.  Preoperative Cardiovascular Risk Assessment: According to the Revised Cardiac Risk Index (RCRI), her Perioperative Risk of Major Cardiac Event is (%): 0.4   Her Functional Capacity in METs is: 9.89 according to the Duke Activity Status Index (DASI).    The patient was advised that if she develops new symptoms prior to surgery to contact our office to arrange for a follow-up visit, and she verbalized understanding.   Per office protocol, if patient is without any new symptoms or concerns at the time of their virtual visit, he/she may hold ASA for 7 days prior to procedure. Please resume ASA as soon as possible postprocedure, at the discretion of the surgeon.     Although she is prescribed Wegovy, she has not had any refills. She is aware to hold the [facility] for one week prior to the procedure if she is able to get the medication refilled before the colonoscopy.    Therefore, based on ACC/AHA guidelines, patient would be at acceptable risk for the planned procedure without further cardiovascular testing. I will route this recommendation to the requesting party via Epic fax function.

## 2023-06-18 ENCOUNTER — Encounter: Payer: Self-pay | Admitting: Gastroenterology

## 2023-06-19 NOTE — Telephone Encounter (Signed)
error 

## 2023-06-21 ENCOUNTER — Other Ambulatory Visit: Payer: Self-pay | Admitting: Internal Medicine

## 2023-06-22 ENCOUNTER — Telehealth: Payer: Self-pay

## 2023-06-22 NOTE — Telephone Encounter (Signed)
Patient called in to schedule her procedure with Dr. Tobi Bastos. Her procedure is on 06/25/23.

## 2023-06-23 ENCOUNTER — Telehealth: Payer: Self-pay

## 2023-06-23 NOTE — Telephone Encounter (Signed)
Pt returned phone call.  Colonoscopy has been rescheduled from 06/25/23 to 07/23/23.  Instructions updated.  Referral updated.  Vikki in Endo notified.  Thanks,  [place], [place]

## 2023-06-29 ENCOUNTER — Other Ambulatory Visit (INDEPENDENT_AMBULATORY_CARE_PROVIDER_SITE_OTHER): Payer: Medicare PPO

## 2023-06-29 DIAGNOSIS — R944 Abnormal results of kidney function studies: Secondary | ICD-10-CM | POA: Diagnosis not present

## 2023-06-30 ENCOUNTER — Telehealth: Payer: Self-pay

## 2023-06-30 ENCOUNTER — Encounter: Payer: Self-pay | Admitting: Internal Medicine

## 2023-06-30 DIAGNOSIS — N182 Chronic kidney disease, stage 2 (mild): Secondary | ICD-10-CM | POA: Insufficient documentation

## 2023-06-30 DIAGNOSIS — R944 Abnormal results of kidney function studies: Secondary | ICD-10-CM

## 2023-06-30 LAB — RENAL FUNCTION PANEL
Albumin: 4.7 g/dL (ref 3.5–5.2)
BUN: 16 mg/dL (ref 6–23)
CO2: 29 meq/L (ref 19–32)
Calcium: 10.4 mg/dL (ref 8.4–10.5)
Chloride: 97 meq/L (ref 96–112)
Creatinine, Ser: 1.04 mg/dL (ref 0.40–1.20)
GFR: 55.19 mL/min — ABNORMAL LOW (ref >60.00)
Glucose, Bld: 88 mg/dL (ref 70–99)
Phosphorus: 3.4 mg/dL (ref 2.3–4.6)
Potassium: 4.3 meq/L (ref 3.5–5.1)
Sodium: 136 meq/L (ref 135–145)

## 2023-06-30 NOTE — Telephone Encounter (Signed)
LMTCB in regards to lab results.

## 2023-06-30 NOTE — Telephone Encounter (Signed)
Patrient just called back. I read her the message. She said she only take half a pill of the triamterene. Her number is [phone].

## 2023-06-30 NOTE — Telephone Encounter (Signed)
-----   Message from Sherlene Shams sent at 06/30/2023 12:43 PM EST -----  Your kidney function  remains slightly low but stable. Please confirm whether you have been taking triamterene/hct,  "

## 2023-07-01 NOTE — Addendum Note (Signed)
Addended by: Sherlene Shams on: 07/01/2023 12:53 PM   Modules accepted: Orders

## 2023-07-01 NOTE — Telephone Encounter (Signed)
Pt is aware and scheduled the lab appt.

## 2023-07-09 ENCOUNTER — Other Ambulatory Visit (INDEPENDENT_AMBULATORY_CARE_PROVIDER_SITE_OTHER): Payer: Medicare PPO

## 2023-07-09 ENCOUNTER — Telehealth: Payer: Self-pay

## 2023-07-09 DIAGNOSIS — R944 Abnormal results of kidney function studies: Secondary | ICD-10-CM

## 2023-07-09 NOTE — Telephone Encounter (Signed)
Noted. Pt has been informed and verbalized understanding

## 2023-07-09 NOTE — Telephone Encounter (Signed)
Pt came int office for lab draw. Pt wanted to know if she should go back to taking her HTCZ now that labs have been drawn or does she stay off of it til results come back

## 2023-07-10 LAB — RENAL FUNCTION PANEL
Albumin: 4.2 g/dL (ref 3.5–5.2)
BUN: 12 mg/dL (ref 6–23)
CO2: 29 meq/L (ref 19–32)
Calcium: 9.5 mg/dL (ref 8.4–10.5)
Chloride: 102 meq/L (ref 96–112)
Creatinine, Ser: 1.05 mg/dL (ref 0.40–1.20)
GFR: 54.55 mL/min — ABNORMAL LOW (ref >60.00)
Glucose, Bld: 77 mg/dL (ref 70–99)
Phosphorus: 4 mg/dL (ref 2.3–4.6)
Potassium: 4.6 meq/L (ref 3.5–5.1)
Sodium: 139 meq/L (ref 135–145)

## 2023-07-16 ENCOUNTER — Encounter: Payer: Self-pay | Admitting: Gastroenterology

## 2023-07-21 ENCOUNTER — Telehealth: Payer: Self-pay

## 2023-07-21 ENCOUNTER — Other Ambulatory Visit: Payer: Self-pay

## 2023-07-21 MED ORDER — NA SULFATE-K SULFATE-MG SULF 17.5-3.13-1.6 GM/177ML PO SOLN: 354.0000 mL | Freq: Once | ORAL | 0 refills | Status: AC

## 2023-07-21 NOTE — Telephone Encounter (Signed)
Patient called in left a voicemail requesting her prep. I called the patient back to inform her we receive her message, and I left a message.

## 2023-07-23 ENCOUNTER — Encounter: Payer: Self-pay | Admitting: Gastroenterology

## 2023-07-23 ENCOUNTER — Ambulatory Visit: Payer: Medicare PPO | Admitting: Certified Registered"

## 2023-07-23 ENCOUNTER — Encounter
Admission: RE | Disposition: A | Discharge status: Home / Self Care | Payer: Self-pay | Source: Ambulatory Visit | Attending: Gastroenterology

## 2023-07-23 ENCOUNTER — Ambulatory Visit
Admission: RE | Admit: 2023-07-23 | Discharge: 2023-07-23 | Disposition: A | Discharge status: Home / Self Care | Payer: Medicare PPO | Source: Ambulatory Visit | Attending: Gastroenterology | Admitting: Gastroenterology

## 2023-07-23 DIAGNOSIS — I10 Essential (primary) hypertension: Secondary | ICD-10-CM | POA: Diagnosis not present

## 2023-07-23 DIAGNOSIS — Z860100 Personal history of colon polyps, unspecified: Secondary | ICD-10-CM

## 2023-07-23 DIAGNOSIS — Z87891 Personal history of nicotine dependence: Secondary | ICD-10-CM | POA: Diagnosis not present

## 2023-07-23 DIAGNOSIS — Z1211 Encounter for screening for malignant neoplasm of colon: Secondary | ICD-10-CM | POA: Diagnosis not present

## 2023-07-23 DIAGNOSIS — Z860101 Personal history of adenomatous and serrated colon polyps: Secondary | ICD-10-CM | POA: Insufficient documentation

## 2023-07-23 DIAGNOSIS — Z7985 Long-term (current) use of injectable non-insulin antidiabetic drugs: Secondary | ICD-10-CM | POA: Diagnosis not present

## 2023-07-23 HISTORY — PX: COLONOSCOPY WITH PROPOFOL: SHX5780

## 2023-07-23 HISTORY — DX: Cardiac arrhythmia, unspecified: I49.9

## 2023-07-23 SURGERY — COLONOSCOPY WITH PROPOFOL
Anesthesia: General

## 2023-07-23 MED ORDER — PHENYLEPHRINE 80 MCG/ML (10ML) SYRINGE FOR IV PUSH (FOR BLOOD PRESSURE SUPPORT)
PREFILLED_SYRINGE | INTRAVENOUS | Status: DC | PRN
  Administered 2023-07-23 (×2): 80 ug via INTRAVENOUS

## 2023-07-23 MED ORDER — SODIUM CHLORIDE 0.9 % IV SOLN: INTRAVENOUS | Status: DC

## 2023-07-23 MED ORDER — LIDOCAINE HCL (CARDIAC) PF 100 MG/5ML IV SOSY
PREFILLED_SYRINGE | INTRAVENOUS | Status: DC | PRN
  Administered 2023-07-23: 100 mg via INTRAVENOUS

## 2023-07-23 MED ORDER — PROPOFOL 10 MG/ML IV BOLUS
INTRAVENOUS | Status: DC | PRN
  Administered 2023-07-23: 140 ug/kg/min via INTRAVENOUS
  Administered 2023-07-23: 90 mg via INTRAVENOUS

## 2023-07-23 MED ORDER — PROPOFOL 10 MG/ML IV BOLUS
INTRAVENOUS | Status: AC
  Filled 2023-07-23: qty 40

## 2023-07-23 MED ORDER — LIDOCAINE HCL (PF) 2 % IJ SOLN
INTRAMUSCULAR | Status: AC
  Filled 2023-07-23: qty 5

## 2023-07-23 NOTE — H&P (Signed)
Wyline Mood, MD [address], [address], [place], [place], [address] [address], [address], [place], [place], [address] Phone: [phone]  Fax: [phone]  Primary Care Physician:  Sherlene Shams, MD   Pre-Procedure History & Physical: HPI:  Sarah Delgado is a 67 y.o. female is here for an colonoscopy.   Past Medical History:  Diagnosis Date   Chicken pox    Hyperlipidemia    Hypertension    Insect bite of right side of back with infection 03/24/2023   Tobacco abuse 09/16/2012   Social smoker on weekends.      Past Surgical History:  Procedure Laterality Date   ABDOMINAL HYSTERECTOMY     BREAST BIOPSY Left 12/14/2020   cylinder marker, BENIGN BREAST TISSUE WITH: FIBROCYSTIC CHANGES WITH ASSOCIATED CALCIFICATIONS, USUAL DUCTAL HYPERPLASIA , DENSE STROMAL FIBROSIS, NEGATIVE FOR AYPTIA OR MALIGNANCY   BREAST SURGERY  2000   reduction   COLONOSCOPY WITH PROPOFOL N/A 12/27/2019   Procedure: COLONOSCOPY WITH PROPOFOL;  Surgeon: Wyline Mood, MD;  Location: [facility] ENDOSCOPY;  Service: Gastroenterology;  Laterality: N/A;   REDUCTION MAMMAPLASTY Bilateral    at least 15 years ago   TOTAL ABDOMINAL HYSTERECTOMY W/ BILATERAL SALPINGOOPHORECTOMY  2005    Prior to Admission medications   Medication Sig Start Date End Date Taking? Authorizing Provider  aspirin EC 81 MG tablet Take 81 mg by mouth daily.    [provider]  atorvastatin (LIPITOR) 10 MG tablet TAKE 1 TABLET BY MOUTH EVERY DAY 11/27/22   Sherlene Shams, MD  Biotin 1000 MCG tablet Take 1,000 mcg by mouth daily.     [provider]  Cholecalciferol (VITAMIN D-3) 1000 units CAPS Take 1 capsule by mouth daily.    [provider]  Coenzyme Q10 10 MG capsule Take 10 mg by mouth daily.    [provider]  estradiol (ESTRACE) 0.5 MG tablet Take 1 tablet (0.5 mg total) by mouth daily. 10/08/22   Sherlene Shams, MD  propranolol (INDERAL) 10 MG tablet Take 0.5 tablets (5 mg total) by mouth as  needed. FOR PALPITATIONS 05/27/23   Sherlene Shams, MD  Semaglutide-Weight Management (WEGOVY) 1.7 MG/0.75ML SOAJ INJECT 1.7 MG INTO THE SKIN ONCE A WEEK FOR 28 DAYS. 06/22/23   Sherlene Shams, MD  triamterene-hydrochlorothiazide (MAXZIDE-25) 37.5-25 MG tablet Take 0.5 tablets by mouth daily. 05/25/23   Sherlene Shams, MD  vitamin B-12 (CYANOCOBALAMIN) 500 MCG tablet Take 500 mcg daily by mouth.    [provider]  vitamin E 180 MG (400 UNITS) capsule Take 400 Units daily by mouth.    [provider]    Allergies as of 05/28/2023   (No Known Allergies)    Family History  Problem Relation Age of Onset   Arthritis Mother    Hypertension Mother    Mental illness Mother    Emphysema Father    COPD Sister    Diabetes Sister    Kidney disease Sister    Diabetes Brother    Hypertension Brother    Heart disease Maternal Grandfather    Diabetes Brother    Hypertension Brother    Cancer Brother        prostate   Hypertension Brother    Stroke Brother    Hypertension Brother    Cancer Maternal Aunt        esophageal ca,  history of etoh and tobacco    Breast cancer Neg Hx     Social History  Socioeconomic History   Marital status: Divorced    Spouse name: Not on file   Number of children: Not on file   Years of education: 16 plus   Highest education level: Master's degree (e.g., MA, MS, MEng, MEd, MSW, MBA)  Occupational History   Occupation: Wellsite geologist: [employer]  Tobacco Use   Smoking status: Former    Types: Cigarettes    Start date: 11/18/2016   Smokeless tobacco: Never  Vaping Use   Vaping status: Never Used  Substance and Sexual Activity   Alcohol use: Yes    Comment: OCC wine   Drug use: No   Sexual activity: Yes    Partners: Male  Other Topics Concern   Not on file  Social History Narrative   Programmer, systems, works in [employer] school system   Social Drivers of Health   Financial Resource Strain: Low Risk   (05/25/2023)   Overall Financial Resource Strain (CARDIA)    Difficulty of Paying Living Expenses: Not hard at all  Food Insecurity: No Food Insecurity (05/25/2023)   Hunger Vital Sign    Worried About Running Out of Food in the Last Year: Never true    Ran Out of Food in the Last Year: Never true  Transportation Needs: No Transportation Needs (05/25/2023)   PRAPARE - Administrator, Civil Service (Medical): No    Lack of Transportation (Non-Medical): No  Physical Activity: Inactive (05/25/2023)   Exercise Vital Sign    Days of Exercise per Week: 0 days    Minutes of Exercise per Session: 30 min  Stress: No Stress Concern Present (05/25/2023)   [employer] of Occupational Health - Occupational Stress Questionnaire    Feeling of Stress : Only a little  Social Connections: Moderately Integrated (05/25/2023)   Social Connection and Isolation Panel [NHANES]    Frequency of Communication with Friends and Family: More than three times a week    Frequency of Social Gatherings with Friends and Family: Twice a week    Attends Religious Services: More than 4 times per year    Active Member of [employer] or Organizations: Yes    Attends Engineer, structural: More than 4 times per year    Marital Status: Divorced  Intimate Partner Violence: Not At Risk (03/23/2023)   Humiliation, Afraid, Rape, and Kick questionnaire    Fear of Current or Ex-Partner: No    Emotionally Abused: No    Physically Abused: No    Sexually Abused: No    Review of Systems: See HPI, otherwise negative ROS  Physical Exam: There were no vitals taken for this visit. General:   Alert,  pleasant and cooperative in NAD Head:  Normocephalic and atraumatic. Neck:  Supple; no masses or thyromegaly. Lungs:  Clear throughout to auscultation, normal respiratory effort.    Heart:  +S1, +S2, Regular rate and rhythm, No edema. Abdomen:  Soft, nontender and nondistended. Normal bowel sounds, without  guarding, and without rebound.   Neurologic:  Alert and  oriented x4;  grossly normal neurologically.  Impression/Plan: Sarah Delgado is here for an colonoscopy to be performed for surveillance due to prior history of colon polyps   Risks, benefits, limitations, and alternatives regarding  colonoscopy have been reviewed with the patient.  Questions have been answered.  All parties agreeable.   Wyline Mood, MD  07/23/2023, 7:40 AM

## 2023-07-23 NOTE — Anesthesia Preprocedure Evaluation (Signed)
Anesthesia Evaluation  Patient identified by MRN, date of birth, ID band Patient awake    Reviewed: Allergy & Precautions, NPO status , Patient's Chart, lab work & pertinent test results  History of Anesthesia Complications Negative for: history of anesthetic complications  Airway Mallampati: III  TM Distance: >3 FB Neck ROM: full    Dental  (+) Chipped   Pulmonary neg shortness of breath, former smoker   Pulmonary exam normal        Cardiovascular Exercise Tolerance: Good hypertension, (-) angina Normal cardiovascular exam     Neuro/Psych negative neurological ROS  negative psych ROS   GI/Hepatic negative GI ROS, Neg liver ROS,neg GERD  ,,  Endo/Other  negative endocrine ROS    Renal/GU Renal disease  negative genitourinary   Musculoskeletal   Abdominal   Peds  Hematology negative hematology ROS (+)   Anesthesia Other Findings Past Medical History: No date: Chicken pox No date: Dysrhythmia     Comment:  holter monitor showed PVC and cleared by cardiology No date: Hyperlipidemia No date: Hypertension 03/24/2023: Insect bite of right side of back with infection 09/16/2012: Tobacco abuse     Comment:  Social smoker on weekends.    Past Surgical History: No date: ABDOMINAL HYSTERECTOMY 12/14/2020: BREAST BIOPSY; Left     Comment:  cylinder marker, BENIGN BREAST TISSUE WITH: FIBROCYSTIC               CHANGES WITH ASSOCIATED CALCIFICATIONS, USUAL DUCTAL               HYPERPLASIA , DENSE STROMAL FIBROSIS, NEGATIVE FOR AYPTIA              OR MALIGNANCY 2000: BREAST SURGERY     Comment:  reduction 12/27/2019: COLONOSCOPY WITH PROPOFOL; N/A     Comment:  Procedure: COLONOSCOPY WITH PROPOFOL;  Surgeon: Wyline Mood, MD;  Location: [facility] ENDOSCOPY;  Service:               Gastroenterology;  Laterality: N/A; No date: REDUCTION MAMMAPLASTY; Bilateral     Comment:  at least 15 years ago 2005: TOTAL  ABDOMINAL HYSTERECTOMY W/ BILATERAL SALPINGOOPHORECTOMY     Reproductive/Obstetrics negative OB ROS                             Anesthesia Physical Anesthesia Plan  ASA: 2  Anesthesia Plan: General   Post-op Pain Management:    Induction: Intravenous  PONV Risk Score and Plan: Propofol infusion and TIVA  Airway Management Planned: Natural Airway and Nasal Cannula  Additional Equipment:   Intra-op Plan:   Post-operative Plan:   Informed Consent: I have reviewed the patients History and Physical, chart, labs and discussed the procedure including the risks, benefits and alternatives for the proposed anesthesia with the patient or authorized representative who has indicated his/her understanding and acceptance.     Dental Advisory Given  Plan Discussed with: Anesthesiologist, CRNA and Surgeon  Anesthesia Plan Comments: (Patient consented for risks of anesthesia including but not limited to:  - adverse reactions to medications - risk of airway placement if required - damage to eyes, teeth, lips or other oral mucosa - nerve damage due to positioning  - sore throat or hoarseness - Damage to heart, brain, nerves, lungs, other parts of body or loss of life  Patient voiced understanding and assent.)  Anesthesia Quick Evaluation

## 2023-07-23 NOTE — Op Note (Signed)
[facility] Gastroenterology Patient Name: Sarah Delgado Procedure Date: 07/23/2023 8:06 AM MRN: [id number] Account #: [id number] Date of Birth: 12-Jan-1956 Admit Type: Outpatient Age: 67 Room: [facility] ENDO ROOM 2 Gender: Female Note Status: Finalized Instrument Name: Prentice Docker [id number] Procedure:             Colonoscopy Indications:           Surveillance: Personal history of adenomatous polyps                         on last colonoscopy > 3 years ago, Last colonoscopy:                         May 2021 Providers:             Wyline Mood MD, MD Referring MD:          Duncan Dull, MD (Referring MD) Medicines:             Monitored Anesthesia Care Complications:         No immediate complications. Procedure:             Pre-Anesthesia Assessment:                        - Prior to the procedure, a History and Physical was                         performed, and patient medications, allergies and                         sensitivities were reviewed. The patient's tolerance                         of previous anesthesia was reviewed.                        - The risks and benefits of the procedure and the                         sedation options and risks were discussed with the                         patient. All questions were answered and informed                         consent was obtained.                        - ASA Grade Assessment: II - A patient with mild                         systemic disease.                        After obtaining informed consent, the colonoscope was                         passed under direct vision. Throughout the procedure,                         the patient's blood  pressure, pulse, and oxygen                         saturations were monitored continuously. The                         Colonoscope was introduced through the anus and                         advanced to the the cecum, identified by the                          appendiceal orifice. The colonoscopy was performed                         with ease. The patient tolerated the procedure well.                         The quality of the bowel preparation was excellent.                         The ileocecal valve, appendiceal orifice, and rectum                         were photographed. Findings:      The entire examined colon appeared normal on direct and retroflexion       views. Impression:            - The entire examined colon is normal on direct and                         retroflexion views.                        - No specimens collected. Recommendation:        - Discharge patient to home (with escort).                        - Resume previous diet.                        - Continue present medications.                        - Repeat colonoscopy in 5 years for surveillance. Procedure Code(s):     --- Professional ---                        [phone], Colonoscopy, flexible; diagnostic, including                         collection of specimen(s) by brushing or washing, when                         performed (separate procedure) Diagnosis Code(s):     --- Professional ---                        Z86.010, Personal history of colonic polyps CPT copyright 2022 American Medical Association. All rights reserved. The codes documented in this report are preliminary and upon coder  review may  be revised to meet current compliance requirements. Wyline Mood, MD Wyline Mood MD, MD 07/23/2023 8:42:49 AM This report has been signed electronically. Number of Addenda: 0 Note Initiated On: 07/23/2023 8:06 AM Scope Withdrawal Time: 0 hours 10 minutes 47 seconds  Total Procedure Duration: 0 hours 15 minutes 6 seconds  Estimated Blood Loss:  Estimated blood loss: none.      [facility]

## 2023-07-23 NOTE — Anesthesia Postprocedure Evaluation (Signed)
Anesthesia Post Note  Patient: Sarah Delgado  Procedure(s) Performed: COLONOSCOPY WITH PROPOFOL  Patient location during evaluation: Endoscopy Anesthesia Type: General Level of consciousness: awake and alert Pain management: pain level controlled Vital Signs Assessment: post-procedure vital signs reviewed and stable Respiratory status: spontaneous breathing, nonlabored ventilation, respiratory function stable and patient connected to nasal cannula oxygen Cardiovascular status: blood pressure returned to baseline and stable Postop Assessment: no apparent nausea or vomiting Anesthetic complications: no   No notable events documented.   Last Vitals:  Vitals:   07/23/23 0846 07/23/23 0856  BP:  103/63  Pulse:  66  Resp: 16 16  Temp: (!) 35.9 C   SpO2:      Last Pain:  Vitals:   07/23/23 0906  TempSrc:   PainSc: 0-No pain                 Cleda Mccreedy Brieann Osinski

## 2023-07-23 NOTE — Transfer of Care (Signed)
Immediate Anesthesia Transfer of Care Note  Patient: Sarah Delgado  Procedure(s) Performed: COLONOSCOPY WITH PROPOFOL  Patient Location: Endoscopy Unit  Anesthesia Type:General  Level of Consciousness: drowsy  Airway & Oxygen Therapy: Patient Spontanous Breathing  Post-op Assessment: Report given to RN and Post -op Vital signs reviewed and stable  Post vital signs: Reviewed and stable  Last Vitals:  Vitals Value Taken Time  BP 102/50 07/23/23 0846  Temp 35.8 0846  Pulse 65 07/23/23 0848  Resp 18 0845  SpO2 100 % 07/23/23 0848  Vitals shown include unfiled device data.  Last Pain:  Vitals:   07/23/23 0752  TempSrc: Temporal         Complications: No notable events documented.

## 2023-07-24 ENCOUNTER — Encounter: Payer: Self-pay | Admitting: Gastroenterology

## 2023-08-15 ENCOUNTER — Other Ambulatory Visit: Payer: Self-pay | Admitting: Internal Medicine

## 2023-08-19 NOTE — Telephone Encounter (Signed)
 Called and confirmed pt would like to remain on the 1.7 mg

## 2023-10-23 ENCOUNTER — Other Ambulatory Visit: Payer: Self-pay | Admitting: Internal Medicine

## 2023-11-25 ENCOUNTER — Ambulatory Visit: Payer: Medicare PPO | Admitting: Internal Medicine

## 2023-11-25 ENCOUNTER — Encounter: Payer: Self-pay | Admitting: Internal Medicine

## 2023-11-25 VITALS — BP 130/74 | HR 60 | Ht 67.0 in | Wt 160.8 lb

## 2023-11-25 DIAGNOSIS — N1831 Chronic kidney disease, stage 3a: Secondary | ICD-10-CM | POA: Diagnosis not present

## 2023-11-25 DIAGNOSIS — F4329 Adjustment disorder with other symptoms: Secondary | ICD-10-CM

## 2023-11-25 DIAGNOSIS — E663 Overweight: Secondary | ICD-10-CM

## 2023-11-25 DIAGNOSIS — Z1231 Encounter for screening mammogram for malignant neoplasm of breast: Secondary | ICD-10-CM | POA: Diagnosis not present

## 2023-11-25 DIAGNOSIS — R419 Unspecified symptoms and signs involving cognitive functions and awareness: Secondary | ICD-10-CM | POA: Diagnosis not present

## 2023-11-25 DIAGNOSIS — N182 Chronic kidney disease, stage 2 (mild): Secondary | ICD-10-CM | POA: Diagnosis not present

## 2023-11-25 DIAGNOSIS — E785 Hyperlipidemia, unspecified: Secondary | ICD-10-CM

## 2023-11-25 DIAGNOSIS — I10 Essential (primary) hypertension: Secondary | ICD-10-CM

## 2023-11-25 DIAGNOSIS — R7303 Prediabetes: Secondary | ICD-10-CM | POA: Diagnosis not present

## 2023-11-25 LAB — COMPREHENSIVE METABOLIC PANEL WITH GFR
ALT: 14 U/L (ref 0–35)
AST: 16 U/L (ref 0–37)
Albumin: 4.7 g/dL (ref 3.5–5.2)
Alkaline Phosphatase: 42 U/L (ref 39–117)
BUN: 19 mg/dL (ref 6–23)
CO2: 30 meq/L (ref 19–32)
Calcium: 10.3 mg/dL (ref 8.4–10.5)
Chloride: 98 meq/L (ref 96–112)
Creatinine, Ser: 1 mg/dL (ref 0.40–1.20)
GFR: 57.69 mL/min — ABNORMAL LOW (ref >60.00)
Glucose, Bld: 83 mg/dL (ref 70–99)
Potassium: 4.7 meq/L (ref 3.5–5.1)
Sodium: 135 meq/L (ref 135–145)
Total Bilirubin: 0.7 mg/dL (ref 0.2–1.2)
Total Protein: 7.5 g/dL (ref 6.0–8.3)

## 2023-11-25 LAB — HEMOGLOBIN A1C: Hgb A1c MFr Bld: 5.8 % (ref 4.6–6.5)

## 2023-11-25 LAB — LIPID PANEL
Cholesterol: 199 mg/dL (ref 0–200)
HDL: 74.2 mg/dL (ref >39.00)
LDL Cholesterol: 112 mg/dL — ABNORMAL HIGH (ref 0–99)
NonHDL: 125.14
Total CHOL/HDL Ratio: 3
Triglycerides: 66 mg/dL (ref 0.0–149.0)
VLDL: 13.2 mg/dL (ref 0.0–40.0)

## 2023-11-25 LAB — B12 AND FOLATE PANEL
Folate: 7.2 ng/mL (ref >5.9)
Vitamin B-12: 1091 pg/mL — ABNORMAL HIGH (ref 211–911)

## 2023-11-25 LAB — LDL CHOLESTEROL, DIRECT: Direct LDL: 110 mg/dL

## 2023-11-25 LAB — TSH: TSH: 0.65 u[IU]/mL (ref 0.35–5.50)

## 2023-11-25 MED ORDER — ATORVASTATIN CALCIUM 10 MG PO TABS: 10.0000 mg | ORAL_TABLET | Freq: Every day | ORAL | 3 refills | Status: DC

## 2023-11-25 MED ORDER — ESTRADIOL 0.5 MG PO TABS
0.5000 mg | ORAL_TABLET | Freq: Every day | ORAL | 3 refills | Status: AC
Start: 2023-11-25 — End: ?

## 2023-11-25 MED ORDER — WEGOVY 1.7 MG/0.75ML ~~LOC~~ SOAJ: SUBCUTANEOUS | 5 refills | Status: DC

## 2023-11-25 MED ORDER — TRIAMTERENE-HCTZ 37.5-25 MG PO TABS: 0.5000 | ORAL_TABLET | Freq: Every day | ORAL | 1 refills | Status: DC

## 2023-11-25 NOTE — Assessment & Plan Note (Signed)
 GFR is stable   Lab Results  Component Value Date   CREATININE 1.00 11/25/2023   Lab Results  Component Value Date   NA 135 11/25/2023   K 4.7 11/25/2023   CL 98 11/25/2023   CO2 30 11/25/2023

## 2023-11-25 NOTE — Assessment & Plan Note (Signed)
 S/p stereotactic biopsy (first biopsy done via U/S missed the mass). In May 2022.   Biopsy report  pseudoangiomatous stromal hyperplasia. Findings are concordant with imaging per radiology .  Continue annual screening ; mammogram ordered

## 2023-11-25 NOTE — Assessment & Plan Note (Addendum)
 MMSE score is 30/30.  Symptoms are primarily limited to repeating herself (per son, not in same conversation or even same day) and by a cabin partner during a recent cruise to [place].  Checking TSH. B12 /folate and RPR  and  recommend reading "the End of Alzheimer's: The First Program to Prevent and Reverse Cognitive Decline"  by Fonnie Iba, MD

## 2023-11-25 NOTE — Assessment & Plan Note (Addendum)
 Slowly recovering from the loss of her brother in August .  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

## 2023-11-25 NOTE — Assessment & Plan Note (Signed)
 Managed with Wegovy ,  and regular exercise ,  continue 1.7 mg weekly dose and address need for daily exercise

## 2023-11-25 NOTE — Assessment & Plan Note (Signed)
 Well controlled on maxzide; however GFR nas been < 60 ml/min since 2023.  Will suspend maxzide,  start losartan and retur in 7-10 days ofr BMET and urine micoalb/cr ratio.     Lab Results  Component Value Date   CREATININE 1.00 11/25/2023   Lab Results  Component Value Date   NA 135 11/25/2023   K 4.7 11/25/2023   CL 98 11/25/2023   CO2 30 11/25/2023

## 2023-11-25 NOTE — Progress Notes (Addendum)
 Subjective:  Patient ID: Sarah Delgado, female    DOB: 26-Jan-1956  Age: 68 y.o. MRN: [id number]  CC: The primary encounter diagnosis was Encounter for screening mammogram for malignant neoplasm of breast. Diagnoses of Essential hypertension, Hyperlipidemia LDL goal <100, Prediabetes, Cognitive complaints, Cognitive complaints with normal neuropsychological exam, Overweight (BMI 25.0-29.9), Grief reaction with prolonged bereavement, Essential hypertension, benign, Chronic kidney disease, stage 2, mildly decreased GFR, and Stage 3a chronic kidney disease (HCC) were also pertinent to this visit.   HPI Sarah Delgado presents for  Chief Complaint  Patient presents with   Medical Management of Chronic Issues   1) OVERWEIGHT: USING WEGOVY  .  Weight has plateaued  she has not started  EXERCISING YET.  Has joined the [facility] to lose more weight  2) Concerned about her short term memory:  became more noticeable during a long cruise over several time zones .  There is a maternal history of alzheimers dementia.    3) Hypertension: patient checks blood pressure twice weekly at home.  Readings have been for the most part <130/80 at rest . Patient is following a reduced salt diet most days and is taking medications as prescribed   Outpatient Medications Prior to Visit  Medication Sig Dispense Refill   aspirin EC 81 MG tablet Take 81 mg by mouth daily.     Biotin 1000 MCG tablet Take 1,000 mcg by mouth daily.      Cholecalciferol (VITAMIN D -3) 1000 units CAPS Take 1 capsule by mouth daily.     Coenzyme Q10 10 MG capsule Take 10 mg by mouth daily.     vitamin B-12 (CYANOCOBALAMIN ) 500 MCG tablet Take 500 mcg daily by mouth.     vitamin E 180 MG (400 UNITS) capsule Take 400 Units daily by mouth.     atorvastatin  (LIPITOR) 10 MG tablet TAKE 1 TABLET BY MOUTH EVERY DAY 90 tablet 3   estradiol  (ESTRACE ) 0.5 MG tablet Take 1 tablet (0.5 mg total) by mouth daily. 90 tablet 3   Semaglutide -Weight Management  (WEGOVY ) 1.7 MG/0.75ML SOAJ INJECT 1.7 MG INTO THE SKIN ONCE A WEEK FOR 28 DAYS. 3 mL 0   triamterene -hydrochlorothiazide (MAXZIDE-25) 37.5-25 MG tablet Take 0.5 tablets by mouth daily. 45 tablet 1   propranolol  (INDERAL ) 10 MG tablet Take 0.5 tablets (5 mg total) by mouth as needed. FOR PALPITATIONS (Patient not taking: Reported on 07/23/2023) 30 tablet 0   No facility-administered medications prior to visit.    Review of Systems;  Patient denies headache, fevers, malaise, unintentional weight loss, skin rash, eye pain, sinus congestion and sinus pain, sore throat, dysphagia,  hemoptysis , cough, dyspnea, wheezing, chest pain, palpitations, orthopnea, edema, abdominal pain, nausea, melena, diarrhea, constipation, flank pain, dysuria, hematuria, urinary  Frequency, nocturia, numbness, tingling, seizures,  Focal weakness, Loss of consciousness,  Tremor, insomnia, depression, anxiety, and suicidal ideation.      Objective:  BP 130/74   Pulse 60   Ht 5' 7 (1.702 m)   Wt 160 lb 12.8 oz (72.9 kg)   SpO2 99%   BMI 25.18 kg/m   BP Readings from Last 3 Encounters:  12/04/23 130/68  11/25/23 130/74  07/23/23 103/63    Wt Readings from Last 3 Encounters:  12/04/23 164 lb (74.4 kg)  11/25/23 160 lb 12.8 oz (72.9 kg)  07/23/23 156 lb (70.8 kg)    Physical Exam Vitals reviewed.  Constitutional:      General: She is not in acute distress.  Appearance: Normal appearance. She is normal weight. She is not ill-appearing, toxic-appearing or diaphoretic.  HENT:     Head: Normocephalic.   Eyes:     General: No scleral icterus.       Right eye: No discharge.        Left eye: No discharge.     Conjunctiva/sclera: Conjunctivae normal.    Cardiovascular:     Rate and Rhythm: Normal rate and regular rhythm.     Heart sounds: Normal heart sounds.  Pulmonary:     Effort: Pulmonary effort is normal. No respiratory distress.     Breath sounds: Normal breath sounds.   Musculoskeletal:         General: Normal range of motion.   Skin:    General: Skin is warm and dry.   Neurological:     General: No focal deficit present.     Mental Status: She is alert and oriented to person, place, and time. Mental status is at baseline.   Psychiatric:        Mood and Affect: Mood normal.        Behavior: Behavior normal.        Thought Content: Thought content normal.        Judgment: Judgment normal.    Lab Results  Component Value Date   HGBA1C 5.8 11/25/2023   HGBA1C 5.7 05/27/2023   HGBA1C 5.8 12/02/2021    Lab Results  Component Value Date   CREATININE 0.90 12/14/2023   CREATININE 1.00 11/25/2023   CREATININE 1.05 07/09/2023    Lab Results  Component Value Date   WBC 6.7 09/05/2022   HGB 15.2 09/05/2022   HCT 43.7 09/05/2022   PLT 362 09/05/2022   GLUCOSE 79 12/14/2023   CHOL 199 11/25/2023   TRIG 66.0 11/25/2023   HDL 74.20 11/25/2023   LDLDIRECT 110.0 11/25/2023   LDLCALC 112 (H) 11/25/2023   ALT 14 11/25/2023   AST 16 11/25/2023   NA 138 12/14/2023   K 4.2 12/14/2023   CL 102 12/14/2023   CREATININE 0.90 12/14/2023   BUN 13 12/14/2023   CO2 29 12/14/2023   TSH 0.65 11/25/2023   HGBA1C 5.8 11/25/2023   MICROALBUR <0.7 12/14/2023    No results found.  Assessment & Plan:  .Encounter for screening mammogram for malignant neoplasm of breast -     3D Screening Mammogram, Left and Right; Future  Essential hypertension -     Comprehensive metabolic panel with GFR  Hyperlipidemia LDL goal <100 -     Lipid panel -     LDL cholesterol, direct  Prediabetes -     Comprehensive metabolic panel with GFR -     Hemoglobin A1c  Cognitive complaints -     RPR -     TSH -     B12 and Folate Panel  Cognitive complaints with normal neuropsychological exam Assessment & Plan: MMSE score is 30/30.  Symptoms are primarily limited to repeating herself (per son, not in same conversation or even same day) and by a cabin partner during a recent cruise to  [place].  Checking TSH. B12 /folate and RPR  and  recommend reading the End of Alzheimer's: The First Program to Prevent and Reverse Cognitive Decline  by Cheryl Cary, MD     Overweight (BMI 25.0-29.9) Assessment & Plan: Managed with Wegovy ,  and regular exercise ,  continue 1.7 mg weekly dose and address need for daily exercise    Grief reaction with prolonged bereavement  Assessment & Plan: Slowly recovering from the loss of her brother in August .  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    Essential hypertension, benign Assessment & Plan: Well controlled on maxzide; however GFR nas been < 60 ml/min since 2023.  Will suspend maxzide,  start losartan  and retur in 7-10 days ofr BMET and urine micoalb/cr ratio.     Lab Results  Component Value Date   CREATININE 1.00 11/25/2023   Lab Results  Component Value Date   NA 135 11/25/2023   K 4.7 11/25/2023   CL 98 11/25/2023   CO2 30 11/25/2023      Chronic kidney disease, stage 2, mildly decreased GFR Assessment & Plan: GFR is stable   Lab Results  Component Value Date   CREATININE 1.00 11/25/2023   Lab Results  Component Value Date   NA 135 11/25/2023   K 4.7 11/25/2023   CL 98 11/25/2023   CO2 30 11/25/2023     Orders: -     Basic metabolic panel with GFR; Future -     Microalbumin / creatinine urine ratio; Future  Stage 3a chronic kidney disease (HCC) Assessment & Plan: Stopping maxzide and starting losartan  50 mg daily    Other orders -     Estradiol ; Take 1 tablet (0.5 mg total) by mouth daily.  Dispense: 90 tablet; Refill: 3 -     Wegovy ; INJECT 1.7 MG INTO THE SKIN ONCE A WEEK FOR 28 DAYS.  Dispense: 3 mL; Refill: 5     I spent 41 minutes on the day of this face to face encounter reviewing patient's  ,  prior relevant surgical and non surgical procedures, recent  labs and imaging studies,  counseling on weight management,  reviewing the assessment and plan with patient, and post visit ordering and reviewing of  diagnostics and therapeutics with patient  .   Follow-up: Return in about 3 months (around 02/24/2024) for physical.   Verneita LITTIE Kettering, MD

## 2023-11-25 NOTE — Patient Instructions (Signed)
  I highly recommend reading "the End of Alzheimer's: The First Program to Prevent and Reverse Cognitive Decline"  by Fonnie Iba, MD

## 2023-11-26 LAB — RPR: RPR Ser Ql: NONREACTIVE

## 2023-11-27 ENCOUNTER — Encounter: Payer: Self-pay | Admitting: Internal Medicine

## 2023-11-27 DIAGNOSIS — N183 Chronic kidney disease, stage 3 unspecified: Secondary | ICD-10-CM | POA: Insufficient documentation

## 2023-11-27 MED ORDER — LOSARTAN POTASSIUM 50 MG PO TABS: 50.0000 mg | ORAL_TABLET | Freq: Every day | ORAL | 0 refills | Status: DC

## 2023-11-27 NOTE — Assessment & Plan Note (Signed)
 Stopping maxzide and starting losartan 50 mg daily

## 2023-11-27 NOTE — Addendum Note (Signed)
 Addended by: Thersia Flax on: 11/27/2023 12:47 PM   Modules accepted: Orders

## 2023-11-29 ENCOUNTER — Other Ambulatory Visit: Payer: Self-pay | Admitting: Internal Medicine

## 2023-11-30 ENCOUNTER — Other Ambulatory Visit: Payer: Self-pay | Admitting: Internal Medicine

## 2023-11-30 NOTE — Telephone Encounter (Signed)
 Refilled 5 days ago. Please refuse request.

## 2023-12-01 ENCOUNTER — Ambulatory Visit: Admitting: Cardiology

## 2023-12-04 ENCOUNTER — Encounter: Payer: Self-pay | Admitting: Cardiology

## 2023-12-04 ENCOUNTER — Ambulatory Visit: Attending: Cardiology | Admitting: Cardiology

## 2023-12-04 VITALS — BP 130/68 | HR 59 | Ht 67.0 in | Wt 164.0 lb

## 2023-12-04 DIAGNOSIS — I493 Ventricular premature depolarization: Secondary | ICD-10-CM

## 2023-12-04 DIAGNOSIS — E7800 Pure hypercholesterolemia, unspecified: Secondary | ICD-10-CM | POA: Diagnosis not present

## 2023-12-04 MED ORDER — ATORVASTATIN CALCIUM 20 MG PO TABS: 20.0000 mg | ORAL_TABLET | Freq: Every day | ORAL | 3 refills | Status: AC

## 2023-12-04 NOTE — Patient Instructions (Signed)
 Medication Instructions:  Your physician recommends the following medication changes.  INCREASE: Lipitor to 20 mg daily at bedtime   *If you need a refill on your cardiac medications before your next appointment, please call your pharmacy*  Lab Work: No labs ordered today    Testing/Procedures: No test ordered today   Follow-Up: At [facility], you and your health needs are our priority.  As part of our continuing mission to provide you with exceptional heart care, our providers are all part of one team.  This team includes your primary Cardiologist (physician) and Advanced Practice Providers or APPs (Physician Assistants and Nurse Practitioners) who all work together to provide you with the care you need, when you need it.  Your next appointment:   1 year(s)  Provider:   You may see Constancia Delton, MD or one of the following Advanced Practice Providers on your designated Care Team:   Laneta Pintos, NP Gildardo Labrador, PA-C Varney Gentleman, PA-C Cadence [place], PA-C Ronald Cockayne, NP Morey Ar, NP    We recommend signing up for the patient portal called "MyChart".  Sign up information is provided on this After Visit Summary.  MyChart is used to connect with patients for Virtual Visits (Telemedicine).  Patients are able to view lab/test results, encounter notes, upcoming appointments, etc.  Non-urgent messages can be sent to your provider as well.   To learn more about what you can do with MyChart, go to [email or web address].

## 2023-12-04 NOTE — Progress Notes (Signed)
 Cardiology Office Note:    Date:  12/04/2023   ID:  Sarah Delgado, DOB 05-16-1956, MRN [id number]  PCP:  Thersia Flax, MD   [facility] Providers Cardiologist:  Constancia Delton, MD     Referring MD: Thersia Flax, MD   Chief Complaint  Patient presents with   Follow-up    6 month follow up visit. Patient is doing well on today. Meds reviewed.     History of Present Illness:    Sarah Delgado is a 68 y.o. female with a hx of hyperlipidemia, frequent PVCs, former smoker who presents for follow-up.    Previously seen for PVCs and palpitations.  Symptoms overall improved with quitting smoking and cutting back on caffeine.  She feels well, has no concerns at this time.  Compliant with Lipitor as prescribed.  Last cholesterol check showed slightly elevated levels.  Prior notes/studies Echo 5/24 EF 60 to 65% Lexiscan Myoview  6/24 low restudy, no significant ischemia. Cardiac monitor 10/2022 showed frequent PVCs 10.4% burden, no sustained arrhythmias. Echo 09/2018 EF 60 to 65%  Past Medical History:  Diagnosis Date   Chicken pox    Dysrhythmia    holter monitor showed PVC and cleared by cardiology   Hyperlipidemia    Hypertension    Insect bite of right side of back with infection 03/24/2023   Tobacco abuse 09/16/2012   Social smoker on weekends.      Past Surgical History:  Procedure Laterality Date   ABDOMINAL HYSTERECTOMY     BREAST BIOPSY Left 12/14/2020   cylinder marker, BENIGN BREAST TISSUE WITH: FIBROCYSTIC CHANGES WITH ASSOCIATED CALCIFICATIONS, USUAL DUCTAL HYPERPLASIA , DENSE STROMAL FIBROSIS, NEGATIVE FOR AYPTIA OR MALIGNANCY   BREAST SURGERY  2000   reduction   COLONOSCOPY WITH PROPOFOL  N/A 12/27/2019   Procedure: COLONOSCOPY WITH PROPOFOL ;  Surgeon: Luke Salaam, MD;  Location: [facility] ENDOSCOPY;  Service: Gastroenterology;  Laterality: N/A;   COLONOSCOPY WITH PROPOFOL  N/A 07/23/2023   Procedure: COLONOSCOPY WITH PROPOFOL ;  Surgeon: Luke Salaam, MD;  Location: [facility] ENDOSCOPY;  Service: Gastroenterology;  Laterality: N/A;   REDUCTION MAMMAPLASTY Bilateral    at least 15 years ago   TOTAL ABDOMINAL HYSTERECTOMY W/ BILATERAL SALPINGOOPHORECTOMY  2005    Current Medications: Current Meds  Medication Sig   aspirin EC 81 MG tablet Take 81 mg by mouth daily.   Biotin 1000 MCG tablet Take 1,000 mcg by mouth daily.    Cholecalciferol (VITAMIN D -3) 1000 units CAPS Take 1 capsule by mouth daily.   Coenzyme Q10 10 MG capsule Take 10 mg by mouth daily.   estradiol  (ESTRACE ) 0.5 MG tablet Take 1 tablet (0.5 mg total) by mouth daily.   losartan  (COZAAR ) 50 MG tablet Take 1 tablet (50 mg total) by mouth at bedtime.   Semaglutide -Weight Management (WEGOVY ) 1.7 MG/0.75ML SOAJ INJECT 1.7 MG INTO THE SKIN ONCE A WEEK FOR 28 DAYS.   vitamin B-12 (CYANOCOBALAMIN ) 500 MCG tablet Take 500 mcg daily by mouth.   vitamin E 180 MG (400 UNITS) capsule Take 400 Units daily by mouth.   [DISCONTINUED] atorvastatin  (LIPITOR) 10 MG tablet Take 1 tablet (10 mg total) by mouth daily.     Allergies:   Patient has no known allergies.   Social History   Socioeconomic History   Marital status: Divorced    Spouse name: Not on file   Number of children: Not on file   Years of education: 16 plus   Highest education level: Master's degree (e.g., MA,  MS, MEng, MEd, MSW, MBA)  Occupational History   Occupation: Wellsite geologist: [employer]  Tobacco Use   Smoking status: Former    Types: Cigarettes    Start date: 11/18/2016   Smokeless tobacco: Never  Vaping Use   Vaping status: Never Used  Substance and Sexual Activity   Alcohol use: Yes    Comment: OCC wine   Drug use: No   Sexual activity: Yes    Partners: Male  Other Topics Concern   Not on file  Social History Narrative   Programmer, systems, works in [employer] school system   Social Drivers of Health   Financial Resource Strain: Low Risk  (11/24/2023)   Overall Financial  Resource Strain (CARDIA)    Difficulty of Paying Living Expenses: Not hard at all  Food Insecurity: No Food Insecurity (11/24/2023)   Hunger Vital Sign    Worried About Running Out of Food in the Last Year: Never true    Ran Out of Food in the Last Year: Never true  Transportation Needs: No Transportation Needs (11/24/2023)   PRAPARE - Administrator, Civil Service (Medical): No    Lack of Transportation (Non-Medical): No  Physical Activity: Insufficiently Active (11/24/2023)   Exercise Vital Sign    Days of Exercise per Week: 1 day    Minutes of Exercise per Session: 20 min  Stress: No Stress Concern Present (11/24/2023)   [employer] of Occupational Health - Occupational Stress Questionnaire    Feeling of Stress : Only a little  Social Connections: Moderately Integrated (11/24/2023)   Social Connection and Isolation Panel [NHANES]    Frequency of Communication with Friends and Family: More than three times a week    Frequency of Social Gatherings with Friends and Family: Twice a week    Attends Religious Services: More than 4 times per year    Active Member of [employer] or Organizations: Yes    Attends Engineer, structural: More than 4 times per year    Marital Status: Divorced     Family History: The patient's family history includes Arthritis in her mother; COPD in her sister; Cancer in her brother and maternal aunt; Diabetes in her brother, brother, and sister; Emphysema in her father; Heart disease in her maternal grandfather; Hypertension in her brother, brother, brother, brother, and mother; Kidney disease in her sister; Mental illness in her mother; Stroke in her brother. There is no history of Breast cancer.  ROS:   Please see the history of present illness.     All other systems reviewed and are negative.  EKGs/Labs/Other Studies Reviewed:    The following studies were reviewed today:  EKG Interpretation Date/Time:  Friday Dec 04 2023 16:07:43  EDT Ventricular Rate:  59 PR Interval:  162 QRS Duration:  74 QT Interval:  406 QTC Calculation: 401 R Axis:   19  Text Interpretation: Sinus bradycardia Possible Left atrial enlargement Confirmed by Constancia Delton ([id number]) on 12/04/2023 4:12:45 PM    Recent Labs: 11/25/2023: ALT 14; BUN 19; Creatinine, Ser 1.00; Potassium 4.7; Sodium 135; TSH 0.65  Recent Lipid Panel    Component Value Date/Time   CHOL 199 11/25/2023 1057   CHOL 225 (H) 11/10/2017 0830   TRIG 66.0 11/25/2023 1057   HDL 74.20 11/25/2023 1057   HDL 62 11/10/2017 0830   CHOLHDL 3 11/25/2023 1057   VLDL 13.2 11/25/2023 1057   LDLCALC 112 (H) 11/25/2023 1057   LDLCALC 144 (H) 11/10/2017  0830   LDLDIRECT 110.0 11/25/2023 1057     Risk Assessment/Calculations:             Physical Exam:    VS:  BP 130/68   Pulse (!) 59   Ht 5\' 7"  (1.702 m)   Wt 164 lb (74.4 kg)   SpO2 100%   BMI 25.69 kg/m     Wt Readings from Last 3 Encounters:  12/04/23 164 lb (74.4 kg)  11/25/23 160 lb 12.8 oz (72.9 kg)  07/23/23 156 lb (70.8 kg)     GEN:  Well nourished, well developed in no acute distress HEENT: Normal NECK: No JVD; No carotid bruits CARDIAC: RRR, no murmurs, rubs, gallops RESPIRATORY:  Clear to auscultation without rales, wheezing or rhonchi  ABDOMEN: Soft, non-tender, non-distended MUSCULOSKELETAL:  No edema; No deformity  SKIN: Warm and dry NEUROLOGIC:  Alert and oriented x 3 PSYCHIATRIC:  Normal affect   ASSESSMENT:    1. Frequent PVCs   2. Pure hypercholesterolemia    PLAN:    In order of problems listed above:  Frequent PVCs, 10.4% burden.  Palpitations improved with quitting smoking.  Echo 5/24 EF 60 to 65%, Lexiscan Myoview  with no significant ischemia.  Smoking likely contributed to PVCs.  EKG today with sinus rhythm and no PVCs. Hyperlipidemia, LDL not at goal.  Increase Lipitor to 20 mg daily  Follow-up in 1 year      Medication Adjustments/Labs and Tests Ordered: Current  medicines are reviewed at length with the patient today.  Concerns regarding medicines are outlined above.  Orders Placed This Encounter  Procedures   EKG 12-Lead   Meds ordered this encounter  Medications   atorvastatin  (LIPITOR) 20 MG tablet    Sig: Take 1 tablet (20 mg total) by mouth daily.    Dispense:  30 tablet    Refill:  3    Patient Instructions  Medication Instructions:  Your physician recommends the following medication changes.  INCREASE: Lipitor to 20 mg daily at bedtime   *If you need a refill on your cardiac medications before your next appointment, please call your pharmacy*  Lab Work: No labs ordered today    Testing/Procedures: No test ordered today   Follow-Up: At [facility], you and your health needs are our priority.  As part of our continuing mission to provide you with exceptional heart care, our providers are all part of one team.  This team includes your primary Cardiologist (physician) and Advanced Practice Providers or APPs (Physician Assistants and Nurse Practitioners) who all work together to provide you with the care you need, when you need it.  Your next appointment:   1 year(s)  Provider:   You may see Constancia Delton, MD or one of the following Advanced Practice Providers on your designated Care Team:   Laneta Pintos, NP Gildardo Labrador, PA-C Varney Gentleman, PA-C Cadence Rome, PA-C Ronald Cockayne, NP Morey Ar, NP    We recommend signing up for the patient portal called "MyChart".  Sign up information is provided on this After Visit Summary.  MyChart is used to connect with patients for Virtual Visits (Telemedicine).  Patients are able to view lab/test results, encounter notes, upcoming appointments, etc.  Non-urgent messages can be sent to your provider as well.   To learn more about what you can do with MyChart, go to [email or web address].          Signed, Constancia Delton, MD  12/04/2023 4:27 PM    [facility]

## 2023-12-08 ENCOUNTER — Encounter: Payer: Self-pay | Admitting: Internal Medicine

## 2023-12-14 ENCOUNTER — Encounter: Payer: Self-pay | Admitting: Family

## 2023-12-14 ENCOUNTER — Other Ambulatory Visit (INDEPENDENT_AMBULATORY_CARE_PROVIDER_SITE_OTHER)

## 2023-12-14 DIAGNOSIS — N182 Chronic kidney disease, stage 2 (mild): Secondary | ICD-10-CM | POA: Diagnosis not present

## 2023-12-14 LAB — BASIC METABOLIC PANEL WITH GFR
BUN: 13 mg/dL (ref 6–23)
CO2: 29 meq/L (ref 19–32)
Calcium: 9.7 mg/dL (ref 8.4–10.5)
Chloride: 102 meq/L (ref 96–112)
Creatinine, Ser: 0.9 mg/dL (ref 0.40–1.20)
GFR: 65.44 mL/min (ref >60.00)
Glucose, Bld: 79 mg/dL (ref 70–99)
Potassium: 4.2 meq/L (ref 3.5–5.1)
Sodium: 138 meq/L (ref 135–145)

## 2023-12-14 LAB — MICROALBUMIN / CREATININE URINE RATIO
Creatinine,U: 79 mg/dL
Microalb Creat Ratio: UNDETERMINED mg/g (ref 0.0–30.0)
Microalb, Ur: <0.7 mg/dL

## 2023-12-15 ENCOUNTER — Ambulatory Visit: Payer: Self-pay

## 2023-12-23 ENCOUNTER — Ambulatory Visit: Admitting: Cardiology

## 2024-01-02 ENCOUNTER — Other Ambulatory Visit: Payer: Self-pay | Admitting: Internal Medicine

## 2024-01-04 ENCOUNTER — Other Ambulatory Visit: Payer: Self-pay | Admitting: Internal Medicine

## 2024-01-04 NOTE — Telephone Encounter (Signed)
 Copied from CRM [id number]. Topic: Clinical - Medication Refill >> Jan 04, 2024 12:38 PM Allyne Areola wrote: Medication: losartan  (COZAAR ) 50 MG tablet  Has the patient contacted their pharmacy? No, She is currently on a trip and would like the medication to be sent to a different pharmacy. (Agent: If no, request that the patient contact the pharmacy for the refill. If patient does not wish to contact the pharmacy document the reason why and proceed with request.) (Agent: If yes, when and what did the pharmacy advise?)  This is the patient's preferred pharmacy: [facility]  Address: [address] [place], [place] [address] Phone: [phone]  Is this the correct pharmacy for this prescription? Yes If no, delete pharmacy and type the correct one.   Has the prescription been filled recently? No  Is the patient out of the medication? Yes  Has the patient been seen for an appointment in the last year OR does the patient have an upcoming appointment? Yes  Can we respond through MyChart? No, patient prefers a phone call.   Agent: Please be advised that Rx refills may take up to 3 business days. We ask that you follow-up with your pharmacy.

## 2024-01-05 MED ORDER — LOSARTAN POTASSIUM 50 MG PO TABS: 50.0000 mg | ORAL_TABLET | Freq: Every day | ORAL | 0 refills | Status: DC

## 2024-01-06 ENCOUNTER — Other Ambulatory Visit: Payer: Self-pay | Admitting: Internal Medicine

## 2024-01-08 ENCOUNTER — Ambulatory Visit
Admission: RE | Admit: 2024-01-08 | Discharge: 2024-01-08 | Disposition: A | Discharge status: Home / Self Care | Source: Ambulatory Visit | Attending: Internal Medicine | Admitting: Internal Medicine

## 2024-01-08 DIAGNOSIS — Z1231 Encounter for screening mammogram for malignant neoplasm of breast: Secondary | ICD-10-CM | POA: Insufficient documentation

## 2024-01-25 ENCOUNTER — Telehealth: Payer: Self-pay

## 2024-01-25 ENCOUNTER — Other Ambulatory Visit (HOSPITAL_COMMUNITY): Payer: Self-pay

## 2024-01-25 NOTE — Telephone Encounter (Signed)
 Pharmacy Patient Advocate Encounter   Received notification from CoverMyMeds that prior authorization for Wegovy  1.7 is required/requested.   Insurance verification completed.   The patient is insured through [place] .   Per test claim: PA required; PA submitted to above mentioned insurance via CoverMyMeds Key/confirmation #/EOC [id number] Status is pending

## 2024-01-26 ENCOUNTER — Other Ambulatory Visit (HOSPITAL_COMMUNITY): Payer: Self-pay

## 2024-01-26 ENCOUNTER — Encounter: Payer: Self-pay | Admitting: Internal Medicine

## 2024-01-26 NOTE — Telephone Encounter (Signed)
 Pharmacy Patient Advocate Encounter  Received notification from [employer] that Prior Authorization for Wegovy  1.7 has been DENIED.  Full denial letter will be uploaded to the media tab. See denial reason below.   PA #/Case ID/Reference #: [id number]

## 2024-01-29 ENCOUNTER — Telehealth: Payer: Self-pay

## 2024-01-29 MED ORDER — LOSARTAN POTASSIUM 50 MG PO TABS: 50.0000 mg | ORAL_TABLET | Freq: Every day | ORAL | 1 refills | Status: DC

## 2024-01-29 NOTE — Telephone Encounter (Signed)
 Copied from CRM [id number]. Topic: Clinical - Prescription Issue >> Jan 29, 2024  3:26 PM Ernestene P wrote: Reason for CRM: Pt is trying to refill losartan  50 Mg request 90 ds ( If possible) , would like to have it sent [id number] [place], [place] - [address]

## 2024-01-29 NOTE — Telephone Encounter (Signed)
 Pt.notified

## 2024-01-29 NOTE — Telephone Encounter (Signed)
 Pt requesting not on current med list. Looks like medication was discontinued by mistake. Please advise

## 2024-02-09 ENCOUNTER — Encounter: Payer: Self-pay | Admitting: Internal Medicine

## 2024-02-09 ENCOUNTER — Other Ambulatory Visit (HOSPITAL_COMMUNITY): Payer: Self-pay

## 2024-02-10 ENCOUNTER — Telehealth: Payer: Self-pay | Admitting: Pharmacist

## 2024-02-10 NOTE — Telephone Encounter (Signed)
 Information has been sent to clinical pharmacist for appeals review. It may take 5-7 days to prepare the necessary documentation to request the appeal from the insurance.

## 2024-02-10 NOTE — Telephone Encounter (Signed)
 Can we get an update of this for pt?

## 2024-02-10 NOTE — Telephone Encounter (Signed)
 [employer] has approved the appeal for Wegovy :    Thank you, Devere Pandy, PharmD Clinical Pharmacist  [place]  Direct Dial: [phone]

## 2024-02-10 NOTE — Telephone Encounter (Signed)
 An E-Appeal has been submitted for Wegovy . Will advise when response is received, Please be advised that most companies may take 30 days to make a decision. Appeal letter and supporting documentation have been uploaded and submitted via the [facility] website on 02/10/2024 @4 :04 pm.   Thank you, Devere Pandy, PharmD Clinical Pharmacist  [place]  Direct Dial: [phone]

## 2024-02-11 ENCOUNTER — Other Ambulatory Visit: Payer: Self-pay | Admitting: Internal Medicine

## 2024-02-11 ENCOUNTER — Other Ambulatory Visit: Payer: Self-pay

## 2024-02-11 MED ORDER — WEGOVY 1.7 MG/0.75ML ~~LOC~~ SOAJ: SUBCUTANEOUS | 5 refills | Status: AC

## 2024-02-11 NOTE — Telephone Encounter (Signed)
 Pt aware of approval.

## 2024-02-11 NOTE — Telephone Encounter (Signed)
 Called to let her know this information, and she ask if we could send [facility]. [facility] [place] since [address] does not have the medication.

## 2024-02-11 NOTE — Telephone Encounter (Signed)
Pt notified by mychart

## 2024-03-23 ENCOUNTER — Ambulatory Visit: Payer: Medicare PPO

## 2024-03-24 ENCOUNTER — Encounter (HOSPITAL_BASED_OUTPATIENT_CLINIC_OR_DEPARTMENT_OTHER): Payer: Self-pay

## 2024-03-25 ENCOUNTER — Ambulatory Visit (INDEPENDENT_AMBULATORY_CARE_PROVIDER_SITE_OTHER): Admitting: *Deleted

## 2024-03-25 VITALS — Ht 67.0 in | Wt 162.0 lb

## 2024-03-25 DIAGNOSIS — Z0000 Encounter for general adult medical examination without abnormal findings: Secondary | ICD-10-CM

## 2024-03-25 NOTE — Patient Instructions (Signed)
 Sarah Delgado , Thank you for taking time out of your busy schedule to complete your Annual Wellness Visit with me. I enjoyed our conversation and look forward to speaking with you again next year. I, as well as your care team,  appreciate your ongoing commitment to your health goals. Please review the following plan we discussed and let me know if I can assist you in the future. Your Game plan/ To Do List    Referrals: If you haven't heard from the office you've been referred to, please reach out to them at the phone provided.  Remember to get your annual flu and covid vaccines as discussed  Follow up Visits: We will see or speak with you next year for your Next Medicare AWV with our clinical staff 03/28/25 @ 8:50 Have you seen your provider in the last 6 months (3 months if uncontrolled diabetes)? Yes  Clinician Recommendations:  Aim for 30 minutes of exercise or brisk walking, 6-8 glasses of water, and 5 servings of fruits and vegetables each day.       This is a list of the screenings recommended for you:  Health Maintenance  Topic Date Due   COVID-19 Vaccine (6 - 2024-25 season) 04/05/2023   Flu Shot  03/04/2024   Mammogram  01/07/2025   Medicare Annual Wellness Visit  03/25/2025   Colon Cancer Screening  07/22/2026   DTaP/Tdap/Td vaccine (4 - Td or Tdap) 01/01/2032   Pneumococcal Vaccine for age over 27  Completed   DEXA scan (bone density measurement)  Completed   Hepatitis C Screening  Completed   Zoster (Shingles) Vaccine  Completed   HPV Vaccine  Aged Out   Meningitis B Vaccine  Aged Out   Hepatitis B Vaccine  Discontinued    Advanced directives: (ACP Link)Information on Advanced Care Planning can be found at [place]  Secretary of [facility] Advance Health Care Directives Advance Health Care Directives. [email or web address]  Advance Care Planning is important because it:  [x]  Makes sure you receive the medical care that is consistent with your values, goals, and preferences  [x]  It  provides guidance to your family and loved ones and reduces their decisional burden about whether or not they are making the right decisions based on your wishes.  Follow the link provided in your after visit summary or read over the paperwork we have mailed to you to help you started getting your Advance Directives in place. If you need assistance in completing these, please reach out to us  so that we can help you!

## 2024-03-25 NOTE — Progress Notes (Signed)
 Subjective:   Sarah Delgado is a 68 y.o. who presents for a Medicare Wellness preventive visit.  As a reminder, Annual Wellness Visits don't include a physical exam, and some assessments may be limited, especially if this visit is performed virtually. We may recommend an in-person follow-up visit with your provider if needed.  Visit Complete: Virtual I connected with  Sarah Delgado on 03/25/24 by a video and audio enabled telemedicine application and verified that I am speaking with the correct person using two identifiers.  Patient Location: Home  Provider Location: Home Office  I discussed the limitations of evaluation and management by telemedicine. The patient expressed understanding and agreed to proceed.  Vital Signs: Because this visit was a virtual/telehealth visit, some criteria may be missing or patient reported. Any vitals not documented were not able to be obtained and vitals that have been documented are patient reported.  Persons Participating in Visit: Patient.  AWV Questionnaire: Yes: Patient Medicare AWV questionnaire was completed by the patient on 03/23/24; I have confirmed that all information answered by patient is correct and no changes since this date.  Cardiac Risk Factors include: advanced age (>18men, >109 women);hypertension;dyslipidemia     Objective:    Today's Vitals   03/25/24 0851  Weight: 162 lb (73.5 kg)  Height: 5' 7 (1.702 m)   Body mass index is 25.37 kg/m.     03/25/2024    9:03 AM 03/23/2023   10:07 AM 03/17/2022   11:35 AM 12/27/2019    8:32 AM 05/31/2015    7:36 AM  Advanced Directives  Does Patient Have a Medical Advance Directive? No No No No No   Would patient like information on creating a medical advance directive? No - Patient declined Yes (MAU/Ambulatory/Procedural Areas - Information given) Yes (MAU/Ambulatory/Procedural Areas - Information given)  Yes - Educational materials given      Data saved with a previous  flowsheet row definition    Current Medications (verified) Outpatient Encounter Medications as of 03/25/2024  Medication Sig   aspirin EC 81 MG tablet Take 81 mg by mouth daily.   atorvastatin  (LIPITOR) 20 MG tablet Take 1 tablet (20 mg total) by mouth daily.   Biotin 1000 MCG tablet Take 1,000 mcg by mouth daily.    Cholecalciferol (VITAMIN D -3) 1000 units CAPS Take 1 capsule by mouth daily.   Coenzyme Q10 10 MG capsule Take 10 mg by mouth daily.   estradiol  (ESTRACE ) 0.5 MG tablet Take 1 tablet (0.5 mg total) by mouth daily.   losartan  (COZAAR ) 50 MG tablet Take 1 tablet (50 mg total) by mouth daily.   Semaglutide -Weight Management (WEGOVY ) 1.7 MG/0.75ML SOAJ INJECT 1.7 MG INTO THE SKIN ONCE A WEEK FOR 28 DAYS.   vitamin B-12 (CYANOCOBALAMIN ) 500 MCG tablet Take 500 mcg daily by mouth.   vitamin E 180 MG (400 UNITS) capsule Take 400 Units daily by mouth.   No facility-administered encounter medications on file as of 03/25/2024.    Allergies (verified) Patient has no known allergies.   History: Past Medical History:  Diagnosis Date   Chicken pox    Dysrhythmia    holter monitor showed PVC and cleared by cardiology   Hyperlipidemia    Hypertension    Insect bite of right side of back with infection 03/24/2023   Tobacco abuse 09/16/2012   Social smoker on weekends.     Past Surgical History:  Procedure Laterality Date   ABDOMINAL HYSTERECTOMY     BREAST BIOPSY Left  12/14/2020   cylinder marker, BENIGN BREAST TISSUE WITH: FIBROCYSTIC CHANGES WITH ASSOCIATED CALCIFICATIONS, USUAL DUCTAL HYPERPLASIA , DENSE STROMAL FIBROSIS, NEGATIVE FOR [place] OR MALIGNANCY   BREAST SURGERY  2000   reduction   COLONOSCOPY WITH PROPOFOL  N/A 12/27/2019   Procedure: COLONOSCOPY WITH PROPOFOL ;  Surgeon: Therisa Bi, MD;  Location: [facility] ENDOSCOPY;  Service: Gastroenterology;  Laterality: N/A;   COLONOSCOPY WITH PROPOFOL  N/A 07/23/2023   Procedure: COLONOSCOPY WITH PROPOFOL ;  Surgeon: Therisa Bi,  MD;  Location: [facility];  Service: Gastroenterology;  Laterality: N/A;   REDUCTION MAMMAPLASTY Bilateral    at least 15 years ago   TOTAL ABDOMINAL HYSTERECTOMY W/ BILATERAL SALPINGOOPHORECTOMY  2005   Family History  Problem Relation Age of Onset   Arthritis Mother    Hypertension Mother    Mental illness Mother    Emphysema Father    COPD Sister    Diabetes Sister    Kidney disease Sister    Diabetes Brother    Hypertension Brother    Heart disease Maternal Grandfather    Diabetes Brother    Hypertension Brother    Cancer Brother        prostate   Hypertension Brother    Stroke Brother    Hypertension Brother    Cancer Maternal Aunt        esophageal ca,  history of etoh and tobacco    Breast cancer Neg Hx    Social History   Socioeconomic History   Marital status: Divorced    Spouse name: Not on file   Number of children: Not on file   Years of education: 16 plus   Highest education level: Master's degree (e.g., MA, MS, MEng, MEd, MSW, MBA)  Occupational History   Occupation: Wellsite geologist: Film/video editor [employer]  Tobacco Use   Smoking status: Former    Types: Cigarettes    Start date: 11/18/2016   Smokeless tobacco: Never  Vaping Use   Vaping status: Never Used  Substance and Sexual Activity   Alcohol use: Yes    Comment: OCC wine   Drug use: No   Sexual activity: Yes    Partners: Male  Other Topics Concern   Not on file  Social History Narrative   Programmer, systems, works in [employer] school system   Social Drivers of Health   Financial Resource Strain: Low Risk  (03/23/2024)   Overall Financial Resource Strain (CARDIA)    Difficulty of Paying Living Expenses: Not hard at all  Food Insecurity: No Food Insecurity (03/23/2024)   Hunger Vital Sign    Worried About Running Out of Food in the Last Year: Never true    Ran Out of Food in the Last Year: Never true  Transportation Needs: No Transportation Needs (03/23/2024)   PRAPARE -  Administrator, Civil Service (Medical): No    Lack of Transportation (Non-Medical): No  Physical Activity: Inactive (03/23/2024)   Exercise Vital Sign    Days of Exercise per Week: 0 days    Minutes of Exercise per Session: 0 min  Stress: No Stress Concern Present (03/23/2024)   [employer] of Occupational Health - Occupational Stress Questionnaire    Feeling of Stress: Only a little  Social Connections: Moderately Integrated (03/23/2024)   Social Connection and Isolation Panel    Frequency of Communication with Friends and Family: More than three times a week    Frequency of Social Gatherings with Friends and Family: Once a week  Attends Religious Services: More than 4 times per year    Active Member of Clubs or Organizations: Yes    Attends Banker Meetings: More than 4 times per year    Marital Status: Divorced    Tobacco Counseling Counseling given: Not Answered    Clinical Intake:  Pre-visit preparation completed: Yes  Pain : No/denies pain     BMI - recorded: 25.37 Nutritional Status: BMI 25 -29 Overweight Nutritional Risks: None Diabetes: No  Lab Results  Component Value Date   HGBA1C 5.8 11/25/2023   HGBA1C 5.7 05/27/2023   HGBA1C 5.8 12/02/2021     How often do you need to have someone help you when you read instructions, pamphlets, or other written materials from your doctor or pharmacy?: 1 - Never  Interpreter Needed?: No  Information entered by :: R. Lilley Hubble LPN   Activities of Daily Living     03/25/2024    8:52 AM  In your present state of health, do you have any difficulty performing the following activities:  Hearing? 0  Vision? 0  Difficulty concentrating or making decisions? 0  Walking or climbing stairs? 0  Dressing or bathing? 0  Doing errands, shopping? 0  Preparing Food and eating ? N  Using the Toilet? N  In the past six months, have you accidently leaked urine? N  Do you have problems with loss of  bowel control? N  Managing your Medications? N  Managing your Finances? N  Housekeeping or managing your Housekeeping? N    Patient Care Team: Marylynn Verneita CROME, MD as PCP - General (Internal Medicine) Darliss Rogue, MD as PCP - Cardiology (Cardiology)  I have updated your Care Teams any recent Medical Services you may have received from other providers in the past year.     Assessment:   This is a routine wellness examination for [place].  Hearing/Vision screen Hearing Screening - Comments:: No issues Vision Screening - Comments:: glasses   Goals Addressed             This Visit's Progress    Patient Stated       Wants to start back exercising       Depression Screen     03/25/2024    8:59 AM 11/25/2023   10:14 AM 05/27/2023    2:13 PM 03/23/2023   10:02 AM 10/08/2022    9:05 AM 09/05/2022    2:29 PM 03/17/2022   11:15 AM  PHQ 2/9 Scores  PHQ - 2 Score 0 0 1 0 1 1 0  PHQ- 9 Score 0   1     Exception Documentation       --    Fall Risk     03/25/2024    8:55 AM 11/25/2023   10:13 AM 05/27/2023    2:13 PM 03/23/2023    9:59 AM 03/19/2023    8:19 PM  Fall Risk   Falls in the past year? 0 0 0 0 0  Number falls in past yr: 0 0 0 0 0  Injury with Fall? 0 0 0 0   Risk for fall due to : No Fall Risks No Fall Risks No Fall Risks No Fall Risks   Follow up Falls evaluation completed;Falls prevention discussed Falls evaluation completed Falls evaluation completed Falls prevention discussed;Falls evaluation completed     MEDICARE RISK AT HOME:  Medicare Risk at Home Any stairs in or around the home?: Yes If so, are there any without handrails?: No  Home free of loose throw rugs in walkways, pet beds, electrical cords, etc?: Yes Adequate lighting in your home to reduce risk of falls?: Yes Life alert?: No Use of a cane, walker or w/c?: No Grab bars in the bathroom?: No Shower chair or bench in shower?: Yes Elevated toilet seat or a handicapped toilet?: Yes  TIMED  UP AND GO:  Was the test performed?  No  Cognitive Function: 6CIT completed        03/25/2024    9:05 AM 03/23/2023   10:08 AM  6CIT Screen  What Year? 0 points 0 points  What month? 0 points 0 points  What time? 0 points 0 points  Count back from 20 0 points 0 points  Months in reverse 0 points 0 points  Repeat phrase 0 points 2 points  Total Score 0 points 2 points    Immunizations Immunization History  Administered Date(s) Administered   Fluad Quad(high Dose 65+) 06/05/2021, 09/05/2022   Fluad Trivalent(High Dose 65+) 05/27/2023   Hepb-cpg 03/26/2023   Influenza,inj,Quad PF,6+ Mos 06/29/2013, 05/02/2015, 04/28/2016, 06/16/2018   Influenza-Unspecified 05/28/2017, 04/24/2019   Moderna Covid-19 Fall Seasonal Vaccine 55yrs & older 05/07/2022   PFIZER(Purple Top)SARS-COV-2 Vaccination 08/11/2019, 09/01/2019, 05/03/2020, 11/02/2020   PNEUMOCOCCAL CONJUGATE-20 12/02/2021   PPD Test 03/23/2023   Respiratory Syncytial Virus Vaccine,Recomb Aduvanted(Arexvy) 03/26/2022   Tdap 03/17/2008, 10/25/2010, 12/31/2021   Zoster Recombinant(Shingrix) 12/06/2020, 03/13/2021    Screening Tests Health Maintenance  Topic Date Due   COVID-19 Vaccine (6 - 2024-25 season) 04/05/2023   Medicare Annual Wellness (AWV)  03/22/2024   INFLUENZA VACCINE  03/04/2024   MAMMOGRAM  01/07/2025   Colonoscopy  07/22/2026   DTaP/Tdap/Td (4 - Td or Tdap) 01/01/2032   Pneumococcal Vaccine: 50+ Years  Completed   DEXA SCAN  Completed   Hepatitis C Screening  Completed   Zoster Vaccines- Shingrix  Completed   HPV VACCINES  Aged Out   Meningococcal B Vaccine  Aged Out   Hepatitis B Vaccines 19-59 Average Risk  Discontinued    Health Maintenance  Health Maintenance Due  Topic Date Due   COVID-19 Vaccine (6 - 2024-25 season) 04/05/2023   Medicare Annual Wellness (AWV)  03/22/2024   INFLUENZA VACCINE  03/04/2024   Health Maintenance Items Addressed: Discussed the need to update flu and covid  vaccines.  Additional Screening:  Vision Screening: Recommended annual ophthalmology exams for early detection of glaucoma and other disorders of the eye. Up to date   My Eye Doctor Would you like a referral to an eye doctor? No    Dental Screening: Recommended annual dental exams for proper oral hygiene  Community Resource Referral / Chronic Care Management: CRR required this visit?  No   CCM required this visit?  No   Plan:    I have personally reviewed and noted the following in the patient's chart:   Medical and social history Use of alcohol, tobacco or illicit drugs  Current medications and supplements including opioid prescriptions. Patient is not currently taking opioid prescriptions. Functional ability and status Nutritional status Physical activity Advanced directives List of other physicians Hospitalizations, surgeries, and ER visits in previous 12 months Vitals Screenings to include cognitive, depression, and falls Referrals and appointments  In addition, I have reviewed and discussed with patient certain preventive protocols, quality metrics, and best practice recommendations. A written personalized care plan for preventive services as well as general preventive health recommendations were provided to patient.   Angeline Fredericks, [place]   [id number]  After Visit Summary: (MyChart) Due to this being a telephonic visit, the after visit summary with patients personalized plan was offered to patient via MyChart   Notes: Nothing significant to report at this time.

## 2024-06-23 ENCOUNTER — Encounter: Payer: Self-pay | Admitting: Pharmacist

## 2024-06-23 NOTE — Progress Notes (Signed)
 Pharmacy Quality Measure Review  This patient is appearing on a report for being at risk of failing the adherence measure for cholesterol (statin) medications this calendar year.   Medication: atorvastatin   New refill x90 day supply sent by cardiology   Insurance report was not up to date. No action needed at this time.  Medication has been refilled as of 11/4 x90ds.

## 2024-08-04 ENCOUNTER — Other Ambulatory Visit: Payer: Self-pay | Admitting: Internal Medicine

## 2024-09-09 ENCOUNTER — Other Ambulatory Visit: Payer: Self-pay | Admitting: Cardiology

## 2024-11-28 ENCOUNTER — Encounter: Admitting: Internal Medicine

## 2025-03-28 ENCOUNTER — Ambulatory Visit
